# Patient Record
Sex: Female | Born: 1935 | Race: White | Hispanic: No | Marital: Single | Smoking: Never smoker
Health system: Southern US, Community
[De-identification: ages and names within clinical notes are randomized; demographics above are authoritative.]

## PROBLEM LIST (undated history)

## (undated) DIAGNOSIS — K226 Gastro-esophageal laceration-hemorrhage syndrome: Secondary | ICD-10-CM

## (undated) DIAGNOSIS — I509 Heart failure, unspecified: Secondary | ICD-10-CM

## (undated) DIAGNOSIS — F32A Depression, unspecified: Secondary | ICD-10-CM

## (undated) DIAGNOSIS — I4891 Unspecified atrial fibrillation: Secondary | ICD-10-CM

## (undated) DIAGNOSIS — I499 Cardiac arrhythmia, unspecified: Secondary | ICD-10-CM

## (undated) DIAGNOSIS — F329 Major depressive disorder, single episode, unspecified: Secondary | ICD-10-CM

## (undated) DIAGNOSIS — I10 Essential (primary) hypertension: Secondary | ICD-10-CM

## (undated) DIAGNOSIS — M1990 Unspecified osteoarthritis, unspecified site: Secondary | ICD-10-CM

## (undated) DIAGNOSIS — K2210 Ulcer of esophagus without bleeding: Secondary | ICD-10-CM

## (undated) HISTORY — PX: CHOLECYSTECTOMY: SHX55

## (undated) HISTORY — PX: ABDOMINAL HYSTERECTOMY: SHX81

## (undated) HISTORY — PX: APPENDECTOMY: SHX54

---

## 1998-11-15 ENCOUNTER — Ambulatory Visit: Admission: RE | Admit: 1998-11-15 | Discharge: 1998-11-15 | Payer: Self-pay | Admitting: Family Medicine

## 1999-03-02 ENCOUNTER — Ambulatory Visit (HOSPITAL_COMMUNITY): Admission: RE | Admit: 1999-03-02 | Discharge: 1999-03-02 | Payer: Self-pay | Admitting: Family Medicine

## 2001-01-29 ENCOUNTER — Encounter: Payer: Self-pay | Admitting: Family Medicine

## 2001-01-29 ENCOUNTER — Encounter: Admission: RE | Admit: 2001-01-29 | Discharge: 2001-01-29 | Payer: Self-pay | Admitting: Family Medicine

## 2002-05-26 ENCOUNTER — Encounter: Payer: Self-pay | Admitting: Family Medicine

## 2002-05-26 ENCOUNTER — Ambulatory Visit (HOSPITAL_COMMUNITY): Admission: RE | Admit: 2002-05-26 | Discharge: 2002-05-26 | Payer: Self-pay | Admitting: Family Medicine

## 2002-11-17 ENCOUNTER — Encounter: Admission: RE | Admit: 2002-11-17 | Discharge: 2002-11-17 | Payer: Self-pay | Admitting: Family Medicine

## 2002-11-17 ENCOUNTER — Encounter: Payer: Self-pay | Admitting: Family Medicine

## 2002-12-07 ENCOUNTER — Encounter: Admission: RE | Admit: 2002-12-07 | Discharge: 2002-12-07 | Payer: Self-pay | Admitting: Family Medicine

## 2002-12-07 ENCOUNTER — Encounter: Payer: Self-pay | Admitting: Family Medicine

## 2003-07-12 ENCOUNTER — Encounter: Admission: RE | Admit: 2003-07-12 | Discharge: 2003-07-12 | Payer: Self-pay | Admitting: Family Medicine

## 2003-07-12 ENCOUNTER — Encounter: Payer: Self-pay | Admitting: Family Medicine

## 2004-07-16 ENCOUNTER — Inpatient Hospital Stay (HOSPITAL_COMMUNITY): Admission: EM | Admit: 2004-07-16 | Discharge: 2004-07-25 | Payer: Self-pay | Admitting: Emergency Medicine

## 2004-07-23 ENCOUNTER — Encounter (INDEPENDENT_AMBULATORY_CARE_PROVIDER_SITE_OTHER): Payer: Self-pay | Admitting: Cardiology

## 2004-08-09 ENCOUNTER — Encounter: Admission: RE | Admit: 2004-08-09 | Discharge: 2004-08-09 | Payer: Self-pay | Admitting: Family Medicine

## 2007-02-24 ENCOUNTER — Ambulatory Visit (HOSPITAL_COMMUNITY): Admission: RE | Admit: 2007-02-24 | Discharge: 2007-02-24 | Payer: Self-pay | Admitting: Family Medicine

## 2009-02-09 ENCOUNTER — Encounter: Admission: RE | Admit: 2009-02-09 | Discharge: 2009-02-09 | Payer: Self-pay | Admitting: Endocrinology

## 2011-05-10 NOTE — H&P (Signed)
NAME:  Morgan Morris, Morgan Morris                          ACCOUNT NO.:  [id number]   MEDICAL RECORD NO.:  [id number]                   PATIENT TYPE:  EMS   LOCATION:  ED                                   FACILITY:  [facility] [place]   PHYSICIAN:  Jonna L. Robb Matar, M.D.            DATE OF BIRTH:  05-24-1920   DATE OF ADMISSION:  07/16/2004  DATE OF DISCHARGE:                                HISTORY & PHYSICAL   CHIEF COMPLAINT:  Weariness.   HISTORY OF PRESENT ILLNESS:  This 77 year old white female with a 3-day  history of weakness, mild nausea, complete anorexia,  She felt very, very  weary.  She had chills yesterday.  Today her temperature was 102/6 and she  was brought into the emergency room.  She denies any cough that has changed.  She has a low-grade chronic cough every morning but it did change at all,  some generalized aching.   PAST MEDICAL HISTORY:  1. Glaucoma.  2. Some type of an arrythmia that she had years ago. Every once in a while     she will get a paroxysmal tachycardia, may be atrial fibrillation, I'm     not sure.  She has been on Digoxin for years.  3. Type 2 diabetes.  She has had this for 21 years.  It has not seem to have     given her stroke, MI, and she not aware that she had any kidney problem.  4. Hypertension.  5. Psoriasis and psoriatic arthritis.  6. 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   SURGICAL HISTORY:  Appendectomy, hysterectomy, cholecystectomy, bilateral  cataract.   ALLERGIES:  PENICILLIN, SULFA which both make her itch a little bit.   CURRENT MEDICATIONS:  Diovan 80 daily, Lipitor 10 daily, Lantus 28-30 at  h.s., and regular 6-8 units a.c., vitamins, Digitek 0.125 daily, aspirin  325, Lexapro 10, Xalatan eye drops at bedtime, and something called Falcon  b.i.d., they are not sure of the name.   SOCIAL HISTORY:  Nonsmoker, nondrinker, married for 60 years, 2 sons, one  with hypertension.   FAMILY HISTORY:  Cancer of the uterus , impressed.   REVIEW OF  SYSTEMS:  Besides the above, she has not had any soreness or sore  throat but she seems a little hoarse, no wheezing or asthma, no history of  heart attack or blood clots, some mild nausea, no hematemesis or melena.  She does OTERD by history and says that Prilosec helps.  No history of  hematuria or renal calculi, no breast masses or tenderness.  Her last  mammogram in February was normal.  She has never been told of thyroid  problems or anemia.  No swollen glands.  She has psoriasis and psoriatic  arthritis especially in her hands.  Denies seizures or headache.  X has some  mild depression and takes Lexapro.   PHYSICAL EXAMINATION:  99.3, 132/63, 70, 20 97%.  Well-nourished, well-  developed older white female with slightly hoarse voice, conjunctiva in lids  and normal pupils reactive to light and accommodation.  EOMs are full.  She  has decreased hearing in the left ear, dry mucosa, normal pharynx.  NECK:  Supple, without masses or tenderness or thyromegaly.  RESPIRATORY EFFORT:  Normal, the lungs have bibasilar crackles without  wheezing or dullness.  HEART:  Regular rate and rhythm, normal S1 and S2 without murmurs, rubs or  gallops.  Pulse is without bruits, no cyanosis, clubbing or edema.  BREASTS:  NO masses, tenderness or discharge.  ABDOMEN:  Nontender, normal bowel sounds, no hepatosplenomegaly or hernia.  She has a cholecystectomy scar and an appendectomy scar.  GENITALIA:  Normal external genitalia, no cervical or inguinal adenopathy.  EXTREMITIES:  Muscle strength is 5/5 all 4 extremities.  She does have  arthritic changes in the hands.  There are some patches of psoriasis.  NEURO:  Cranial nerves are intact.  DTRs are 2+ and equal.  Full toes go  down.  She is alert and oriented x3.  Normal memory, judgment and affect.  History is from the patient and her husband   ADMISSION LABORATORIES:  EKG shows low voltage, old injury septal MI.  Chest x-ray shows bilateral basilar  pneumonia.  White count is 9.4 with 80% neutrophils.  Hemoglobin is 10.5 with normal  indices.  Urinalysis shows a small positive leukocyte esterase.  BNP is  notable for BUN 37, creatinine 2.1, glucose 213, and an albumin of 3.0.   IMPRESSION:  1. Pneumonia.  She will receive Rocephin and Zithromax.  Blood cultures are     pending.  2. Dehydration.  I'll put her on some IV fluids as possible.  This may     represent a chronic renal failure from diabetes, but it is probably more     likely acute renal failure and dehydration.  3. Uncontrolled diabetes.  I'll put her on sliding scale.  4. Glaucoma.  She will continue her present drops.  5. Hypertension.  I'm going to hold her ARB.  6. Psoriasis and psoriatic arthritis.  7. Anemia.  I'll work that up.   Her primary care physician is Dr. Penni Bombard.                                               Jonna L. Robb Matar, M.D.    Dorna Bloom  D:  07/16/2004  T:  07/16/2004  Job:  [id number]   cc:   Rodolph Bong, M.D.  [address] [place], [place] [address]  Fax: [phone]

## 2011-05-10 NOTE — Discharge Summary (Signed)
NAME:  Morgan Morris, Morgan Morris                          ACCOUNT NO.:  [id number]   MEDICAL RECORD NO.:  [id number]                   PATIENT TYPE:  INP   LOCATION:  0366                                 FACILITY:  [facility]   PHYSICIAN:  Lonia Blood, M.D.                   DATE OF BIRTH:  02-02-1920   DATE OF ADMISSION:  07/16/2004  DATE OF DISCHARGE:  07/25/2004                                 DISCHARGE SUMMARY   PRIMARY CARE PHYSICIAN:  Dr. Penni Bombard   DISCHARGE DIAGNOSES:  1. Bilateral lower lobe pneumonia, right worse than left.  2. Left peripneumonic effusion.  3. Diabetes type 2.  4. Hypertension.  5. Flash pulmonary edema.  6. Chronic anemia.  7. Leukocytosis.  8. Glaucoma.  9. Mild renal insufficiency.   DISCHARGE MEDICATIONS:  1. Digoxin 0.125 mg daily.  2. Lipitor 10 mg p.o. daily.  3. Xalatan ophthalmology eye drops q.h.s.  4. Lantus 30 units at night daily.  5. Sliding scale insulin at mealtimes.  6. Diovan 160 mg daily.  7. Avelox 400 mg daily for seven days.  8. Senokot S one tablet daily for constipation.   DISPOSITION AND FOLLOW-UP:  The patient is to follow up with Dr. Penni Bombard  early next week.  During that follow-up we recommend recheck of her fluid  status and consider starting Lasix.  At time of discharge patient is  slightly on the dry side secondary to Lasix that she received and she might  ultimately need some diuretics but her diuretics have been stopped.  She  will also need to have a BMET check drawn that visit to check her BUN and  creatinine and CO2.  I also suggest another chest x-ray in one month to  follow up on the peripneumonic effusion which seems to be going down from  the series of x-rays we had here.   PROCEDURES PERFORMED:  Plain chest x-ray on admission showed bilateral lower  lobe pneumonia, infiltrates consistent with pneumonia.  She had a follow-up  on July 18, 2004 that showed evidence of congestive heart failure with  increased infiltrates.   A follow-up again on July 22, 2004 was negative for  interval change.  Last chest x-ray on July 24, 2004 shows marked  improvement.  She had a 2-D echocardiogram performed on July 23, 2004 that  shows normal left ventricular size.  She had an ejection fraction of 55-65%  with normal systolic function.  She had aortic valve thickness that was  mildly to moderately increased with mild mitral regurgitation, moderate  tricuspid regurgitation, anterior pericardial effusion posterior to the  heart.  Also, patient was found to have large left pleural effusion.   HISTORY AND PHYSICAL:  Morgan Morris was admitted on July 16, 2004 with  nonspecific complaint of weariness.  She also had some mild nausea and  anorexia.  The patient also complained of fever and chills  prior to arriving  in the hospital with some low grade chronic cough and generalized aching.  She was found to have a temperature of 99.3, blood pressure 132/63, but  normal saturations.  She had biphasic basilar crackles without wheezing or  dullness on admission.  A chest x-ray on admission showed bilateral  pneumonia and blood cultures showed contamination with coag-negative Staph,  one out of two blood cultures.   HOSPITAL COURSE:  #1 - PNEUMONIA:  The patient was initially started on  Zithromax and Rocephin IV on admission on July 16, 2004.  Was briefly on  vancomycin, subsequently following the blood culture was coag-negative Staph  but that was discontinued.  Following the finding that it was coag-negative  Staph rather than Staph aureus, patient was placed back on IV Zithromax and  Rocephin and subsequently discharged on Avelox p.o. to complete a 14-day  course of antibiotics.  Her pneumonia has improved, although she has had  some peripneumonic effusion which seemed to be improving on discharge.  She  will have a follow-up chest x-ray in one month to further evaluate that.   #2 - DIABETES:  The patient has had diabetes for 22  years and she is well  educated and knows the disease very well.  She has had a number of  hypoglycemic episodes during this admission mainly late at night.  Per  patient she usually takes a large amount of snack before bedtime but the  snacks she gets in the hospital have been inadequate.  We made slight  adjustment to her home insulin dose.  However, she would rather get back on  her home dose now that she is going home and back on her regular diet.  I  suggest Dr. Penni Bombard will make adjustment when he sees her next week.   #3 - CONGESTIVE HEART FAILURE:  The patient had some flash pulmonary edema  during this admission that was probably related to fluid overload.  She had  a BNP that was checked that was 344 on July 16, 2004, following diuresis was  66.  She had a 2-D echocardiogram, as mentioned, that shows normal LV  function.  She received Lasix 40 mg b.i.d. initially, reduced to 40 daily  and she was discharged without any Lasix secondary to her elements of some  contraction alkalosis and evidence of patient being dry.  She may require  outpatient diuretics if deemed fit by her PCP.   #4 - ANEMIA:  The patient had stable anemia with hemoglobin around 10 which  is normocytic and normochromic probably secondary to chronic disease.  She  did have a very high ferritin and decreased iron saturations that may signal  possibly a mixed picture or a mixed cause of her anemia.  I suggest she  continue some iron as an outpatient.   #5 - LEUKOCYTOSIS:  The patient's white count jumped to 13,000 on July 22, 2004 and July 23, 2004, but has since steadily come down and at the time of  discharge was 11.3.                                               Lonia Blood, M.D.    Verlin Grills  D:  07/25/2004  T:  07/26/2004  Job:  [id number]   cc:   Penni Bombard, M.D.

## 2012-03-29 ENCOUNTER — Encounter (HOSPITAL_COMMUNITY): Payer: Self-pay | Admitting: *Deleted

## 2012-03-29 ENCOUNTER — Emergency Department (HOSPITAL_COMMUNITY): Payer: Medicare Other

## 2012-03-29 ENCOUNTER — Other Ambulatory Visit: Payer: Self-pay

## 2012-03-29 ENCOUNTER — Inpatient Hospital Stay (HOSPITAL_COMMUNITY)
Admission: EM | Admit: 2012-03-29 | Discharge: 2012-04-02 | DRG: 640 | Disposition: A | Discharge status: Home / Self Care | Payer: Medicare Other | Attending: Internal Medicine | Admitting: Internal Medicine

## 2012-03-29 DIAGNOSIS — Z794 Long term (current) use of insulin: Secondary | ICD-10-CM

## 2012-03-29 DIAGNOSIS — E119 Type 2 diabetes mellitus without complications: Secondary | ICD-10-CM | POA: Diagnosis present

## 2012-03-29 DIAGNOSIS — E876 Hypokalemia: Secondary | ICD-10-CM | POA: Diagnosis not present

## 2012-03-29 DIAGNOSIS — K208 Other esophagitis without bleeding: Secondary | ICD-10-CM | POA: Diagnosis present

## 2012-03-29 DIAGNOSIS — K226 Gastro-esophageal laceration-hemorrhage syndrome: Secondary | ICD-10-CM | POA: Diagnosis present

## 2012-03-29 DIAGNOSIS — A088 Other specified intestinal infections: Secondary | ICD-10-CM | POA: Diagnosis present

## 2012-03-29 DIAGNOSIS — Z880 Allergy status to penicillin: Secondary | ICD-10-CM

## 2012-03-29 DIAGNOSIS — R110 Nausea: Secondary | ICD-10-CM | POA: Diagnosis present

## 2012-03-29 DIAGNOSIS — E860 Dehydration: Principal | ICD-10-CM | POA: Diagnosis present

## 2012-03-29 DIAGNOSIS — Z79899 Other long term (current) drug therapy: Secondary | ICD-10-CM

## 2012-03-29 DIAGNOSIS — F3289 Other specified depressive episodes: Secondary | ICD-10-CM | POA: Diagnosis present

## 2012-03-29 DIAGNOSIS — K920 Hematemesis: Secondary | ICD-10-CM | POA: Diagnosis present

## 2012-03-29 DIAGNOSIS — R197 Diarrhea, unspecified: Secondary | ICD-10-CM | POA: Diagnosis present

## 2012-03-29 DIAGNOSIS — K838 Other specified diseases of biliary tract: Secondary | ICD-10-CM | POA: Diagnosis present

## 2012-03-29 DIAGNOSIS — Z66 Do not resuscitate: Secondary | ICD-10-CM | POA: Diagnosis present

## 2012-03-29 DIAGNOSIS — R748 Abnormal levels of other serum enzymes: Secondary | ICD-10-CM | POA: Diagnosis present

## 2012-03-29 DIAGNOSIS — K2210 Ulcer of esophagus without bleeding: Secondary | ICD-10-CM | POA: Diagnosis present

## 2012-03-29 DIAGNOSIS — N289 Disorder of kidney and ureter, unspecified: Secondary | ICD-10-CM | POA: Diagnosis present

## 2012-03-29 DIAGNOSIS — D649 Anemia, unspecified: Secondary | ICD-10-CM | POA: Diagnosis present

## 2012-03-29 DIAGNOSIS — I10 Essential (primary) hypertension: Secondary | ICD-10-CM | POA: Diagnosis present

## 2012-03-29 DIAGNOSIS — R112 Nausea with vomiting, unspecified: Secondary | ICD-10-CM

## 2012-03-29 DIAGNOSIS — F329 Major depressive disorder, single episode, unspecified: Secondary | ICD-10-CM | POA: Diagnosis present

## 2012-03-29 DIAGNOSIS — Z7982 Long term (current) use of aspirin: Secondary | ICD-10-CM

## 2012-03-29 DIAGNOSIS — D72829 Elevated white blood cell count, unspecified: Secondary | ICD-10-CM | POA: Diagnosis present

## 2012-03-29 HISTORY — DX: Gastro-esophageal laceration-hemorrhage syndrome: K22.6

## 2012-03-29 HISTORY — DX: Essential (primary) hypertension: I10

## 2012-03-29 HISTORY — DX: Major depressive disorder, single episode, unspecified: F32.9

## 2012-03-29 HISTORY — DX: Depression, unspecified: F32.A

## 2012-03-29 HISTORY — DX: Ulcer of esophagus without bleeding: K22.10

## 2012-03-29 LAB — URINALYSIS, ROUTINE W REFLEX MICROSCOPIC
Bilirubin Urine: NEGATIVE
Glucose, UA: NEGATIVE mg/dL
Hgb urine dipstick: NEGATIVE
Protein, ur: NEGATIVE mg/dL

## 2012-03-29 LAB — CBC
HCT: 32.3 % — ABNORMAL LOW (ref 36.0–46.0)
Hemoglobin: 11.2 g/dL — ABNORMAL LOW (ref 12.0–15.0)
MCHC: 34.7 g/dL (ref 30.0–36.0)
RBC: 3.46 MIL/uL — ABNORMAL LOW (ref 3.87–5.11)

## 2012-03-29 LAB — CLOSTRIDIUM DIFFICILE BY PCR: Toxigenic C. Difficile by PCR: NEGATIVE

## 2012-03-29 LAB — COMPREHENSIVE METABOLIC PANEL
BUN: 42 mg/dL — ABNORMAL HIGH (ref 6–23)
CO2: 26 mEq/L (ref 19–32)
Calcium: 8.7 mg/dL (ref 8.4–10.5)
Chloride: 103 mEq/L (ref 96–112)
GFR calc Af Amer: 41 mL/min — ABNORMAL LOW (ref >90)
GFR calc non Af Amer: 35 mL/min — ABNORMAL LOW (ref >90)
Potassium: 4 mEq/L (ref 3.5–5.1)
Sodium: 138 mEq/L (ref 135–145)
Total Bilirubin: 0.6 mg/dL (ref 0.3–1.2)

## 2012-03-29 LAB — TROPONIN I: Troponin I: <0.3 ng/mL (ref <0.30)

## 2012-03-29 LAB — CREATININE, SERUM
GFR calc Af Amer: 40 mL/min — ABNORMAL LOW (ref >90)
GFR calc non Af Amer: 35 mL/min — ABNORMAL LOW (ref >90)

## 2012-03-29 LAB — LIPASE, BLOOD: Lipase: 127 U/L — ABNORMAL HIGH (ref 11–59)

## 2012-03-29 MED ORDER — ESCITALOPRAM OXALATE 10 MG PO TABS
10.0000 mg | ORAL_TABLET | Freq: Every morning | ORAL | Status: DC
  Administered 2012-03-30 – 2012-04-02 (×4): 10 mg via ORAL
  Filled 2012-03-29 (×4): qty 1

## 2012-03-29 MED ORDER — SODIUM CHLORIDE 0.9 % IV SOLN
8.0000 mg/h | INTRAVENOUS | Status: DC
  Administered 2012-03-30: 8 mg/h via INTRAVENOUS
  Filled 2012-03-29 (×4): qty 80

## 2012-03-29 MED ORDER — INSULIN GLARGINE 100 UNIT/ML ~~LOC~~ SOLN
40.0000 [IU] | Freq: Every day | SUBCUTANEOUS | Status: DC
  Administered 2012-03-29: 40 [IU] via SUBCUTANEOUS

## 2012-03-29 MED ORDER — ONDANSETRON HCL 4 MG PO TABS
4.0000 mg | ORAL_TABLET | Freq: Four times a day (QID) | ORAL | Status: DC | PRN
  Administered 2012-04-01: 4 mg via ORAL
  Filled 2012-03-29: qty 1

## 2012-03-29 MED ORDER — SIMVASTATIN 20 MG PO TABS
20.0000 mg | ORAL_TABLET | Freq: Every evening | ORAL | Status: DC
  Administered 2012-03-30 – 2012-04-01 (×3): 20 mg via ORAL
  Filled 2012-03-29 (×5): qty 1

## 2012-03-29 MED ORDER — ONDANSETRON HCL 4 MG/2ML IJ SOLN
4.0000 mg | Freq: Once | INTRAMUSCULAR | Status: AC
  Administered 2012-03-29: 4 mg via INTRAVENOUS
  Filled 2012-03-29: qty 2

## 2012-03-29 MED ORDER — IOHEXOL 300 MG/ML  SOLN
100.0000 mL | Freq: Once | INTRAMUSCULAR | Status: AC | PRN
  Administered 2012-03-29: 100 mL via INTRAVENOUS

## 2012-03-29 MED ORDER — ALUM & MAG HYDROXIDE-SIMETH 200-200-20 MG/5ML PO SUSP
30.0000 mL | Freq: Four times a day (QID) | ORAL | Status: DC | PRN
Start: 2012-03-29 — End: 2012-04-02

## 2012-03-29 MED ORDER — SACCHAROMYCES BOULARDII 250 MG PO CAPS
250.0000 mg | ORAL_CAPSULE | Freq: Two times a day (BID) | ORAL | Status: DC
  Administered 2012-03-30 – 2012-04-02 (×7): 250 mg via ORAL
  Filled 2012-03-29 (×9): qty 1

## 2012-03-29 MED ORDER — ASPIRIN EC 325 MG PO TBEC
325.0000 mg | DELAYED_RELEASE_TABLET | Freq: Every day | ORAL | Status: DC
  Filled 2012-03-29: qty 1

## 2012-03-29 MED ORDER — DIGOXIN 125 MCG PO TABS
125.0000 ug | ORAL_TABLET | Freq: Every day | ORAL | Status: DC
  Administered 2012-03-30 – 2012-04-02 (×3): 125 ug via ORAL
  Filled 2012-03-29 (×5): qty 1

## 2012-03-29 MED ORDER — ONDANSETRON HCL 4 MG/2ML IJ SOLN
INTRAMUSCULAR | Status: AC
  Administered 2012-03-29: 14:00:00
  Filled 2012-03-29: qty 2

## 2012-03-29 MED ORDER — ENOXAPARIN SODIUM 40 MG/0.4ML ~~LOC~~ SOLN
40.0000 mg | SUBCUTANEOUS | Status: DC
  Filled 2012-03-29: qty 0.4

## 2012-03-29 MED ORDER — HYDROCODONE-ACETAMINOPHEN 5-325 MG PO TABS: 1.0000 | ORAL_TABLET | ORAL | Status: DC | PRN

## 2012-03-29 MED ORDER — ALBUTEROL SULFATE (5 MG/ML) 0.5% IN NEBU: 2.5000 mg | INHALATION_SOLUTION | RESPIRATORY_TRACT | Status: DC | PRN

## 2012-03-29 MED ORDER — SODIUM CHLORIDE 0.9 % IV BOLUS (SEPSIS)
1000.0000 mL | Freq: Once | INTRAVENOUS | Status: AC
  Administered 2012-03-29: 500 mL via INTRAVENOUS

## 2012-03-29 MED ORDER — ONDANSETRON HCL 4 MG/2ML IJ SOLN: 4.0000 mg | Freq: Four times a day (QID) | INTRAMUSCULAR | Status: DC | PRN

## 2012-03-29 MED ORDER — GUAIFENESIN-DM 100-10 MG/5ML PO SYRP: 5.0000 mL | ORAL_SOLUTION | ORAL | Status: DC | PRN

## 2012-03-29 MED ORDER — ACETAMINOPHEN 650 MG RE SUPP: 650.0000 mg | Freq: Four times a day (QID) | RECTAL | Status: DC | PRN

## 2012-03-29 MED ORDER — TIMOLOL HEMIHYDRATE 0.5 % OP SOLN
1.0000 [drp] | Freq: Two times a day (BID) | OPHTHALMIC | Status: DC
  Administered 2012-03-30 – 2012-04-02 (×6): 1 [drp] via OPHTHALMIC
  Filled 2012-03-29 (×17): qty 0.1

## 2012-03-29 MED ORDER — ACETAMINOPHEN 325 MG PO TABS: 650.0000 mg | ORAL_TABLET | Freq: Four times a day (QID) | ORAL | Status: DC | PRN

## 2012-03-29 MED ORDER — SODIUM CHLORIDE 0.9 % IV SOLN
INTRAVENOUS | Status: DC
  Administered 2012-03-29: 23:00:00 via INTRAVENOUS

## 2012-03-29 NOTE — ED Provider Notes (Signed)
7 PM patient feels general weakness and nausea. Reportedly also had multiple episodes of diarrhea today. She feels dehydrated, Zofran ordered. Hospitalist physician called to evaluate patient for admission in light of diabetes and dehydration.  Doug Sou, MD 03/29/12 Windell Moment

## 2012-03-29 NOTE — ED Provider Notes (Addendum)
History     CSN: [id number]  Arrival date & time 03/29/12  1337   First MD Initiated Contact with Patient 03/29/12 1403      Chief Complaint  Patient presents with  . Weakness  . Nausea    The history is provided by the patient.   the patient reports generalized weakness nausea and diarrhea.  She's had generalized decreased energy today.  She reports epigastric discomfort.  She denies vomiting.  She reports she's never had upper abdominal pain like this before.  She denies chest pain or shortness of breath.  She's had no fevers or chills.  She has a history of diabetes and hypertension.  She's had an abdominal hysterectomy but has never had her gallbladder removed.  Nothing worsens her symptoms.  Nothing improves her symptoms.  Her symptoms are constant.  Past Medical History  Diagnosis Date  . Hypertension   . Diabetes mellitus   . Depression     Past Surgical History  Procedure Date  . Abdominal hysterectomy     No family history on file.  History  Substance Use Topics  . Smoking status: Never Smoker   . Smokeless tobacco: Not on file  . Alcohol Use: No    OB History    Grav Para Term Preterm Abortions TAB SAB Ect Mult Living                  Review of Systems  All other systems reviewed and are negative.    Allergies  Penicillins and Sulfa antibiotics  Home Medications   Current Outpatient Rx  Name Route Sig Dispense Refill  . ASPIRIN EC 325 MG PO TBEC Oral Take 325 mg by mouth daily at 12 noon.    . B COMPLEX PO TABS Oral Take 1 tablet by mouth daily at 12 noon.    Marland Kitchen VITAMIN D3 PO Oral Take 1 tablet by mouth daily at 12 noon.    Marland Kitchen DIGOXIN 0.125 MG PO TABS Oral Take 125 mcg by mouth daily at 12 noon.    Marland Kitchen ESCITALOPRAM OXALATE 10 MG PO TABS Oral Take 10 mg by mouth every morning.    . INSULIN GLARGINE 100 UNIT/ML [place] SOLN Subcutaneous Inject 40 Units into the skin at bedtime.    . INSULIN REGULAR HUMAN 100 UNIT/ML IJ SOLN Subcutaneous Inject into the skin  3 (three) times daily before meals. She uses a sliding scale.    Marland Kitchen LOSARTAN POTASSIUM 50 MG PO TABS Oral Take 50 mg by mouth every morning.    Marland Kitchen PRESERVISION AREDS PO Oral Take 1 tablet by mouth daily at 12 noon.    Marland Kitchen SIMVASTATIN 20 MG PO TABS Oral Take 20 mg by mouth every evening.    Marland Kitchen TIMOLOL HEMIHYDRATE 0.5 % OP SOLN Both Eyes Place 1 drop into both eyes 2 (two) times daily.    Marland Kitchen ONDANSETRON HCL 4 MG/2ML IJ SOLN Intravenous Inject 4 mg into the vein once.      BP 127/69  Pulse 69  Temp 98.7 F (37.1 C)  Resp 20  SpO2 97%  Physical Exam  Nursing note and vitals reviewed. Constitutional: She is oriented to person, place, and time. She appears well-developed and well-nourished. No distress.  HENT:  Head: Normocephalic and atraumatic.  Eyes: EOM are normal.  Neck: Normal range of motion.  Cardiovascular: Normal rate, regular rhythm and normal heart sounds.   Pulmonary/Chest: Effort normal and breath sounds normal.  Abdominal: Soft. She exhibits no distension. There  is no tenderness.       Mild epigastric tenderness  Musculoskeletal: Normal range of motion.  Neurological: She is alert and oriented to person, place, and time.  Skin: Skin is warm and dry.  Psychiatric: She has a normal mood and affect. Judgment normal.    ED Course  Procedures (including critical care time)   Date: 03/29/2012  Rate: 70  Rhythm: normal sinus rhythm  QRS Axis: normal  Intervals: normal  ST/T Wave abnormalities: normal  Conduction Disutrbances: none  Narrative Interpretation:   Old EKG Reviewed: No significant changes noted     Labs Reviewed  CBC - Abnormal; Notable for the following:    WBC 13.6 (*)    RBC 3.46 (*)    Hemoglobin 11.2 (*)    HCT 32.3 (*)    All other components within normal limits  COMPREHENSIVE METABOLIC PANEL - Abnormal; Notable for the following:    Glucose, Bld 132 (*)    BUN 42 (*)    Creatinine, Ser 1.31 (*)    GFR calc non Af Amer 35 (*)    GFR calc Af  Amer 41 (*)    All other components within normal limits  LIPASE, BLOOD - Abnormal; Notable for the following:    Lipase 127 (*)    All other components within normal limits  URINALYSIS, ROUTINE W REFLEX MICROSCOPIC  TROPONIN I  CLOSTRIDIUM DIFFICILE BY PCR   No results found.   No diagnosis found.    MDM  The patient reports mild epigastric tenderness with nausea and diarrhea.  She has mild tenderness in epigastric region lipase 127.  She still has her gallbladder.  CT scan pending at this time given the fact that she also has diarrhea.  Regardless she'll likely need admission for her nausea her elevated lipase no diarrhea.         Lyanne Co, MD 03/29/12 1707  Lyanne Co, MD 03/29/12 [phone]

## 2012-03-29 NOTE — ED Notes (Signed)
Per EMS - pt comes from home where she lives in [facility].  Pt c/o weakness, nausea and general lethargy.  Pt is A&O.  Pt's vitals WNL.  Pt denies vomiting and diarrhea.  Pt also c/o abdominal pain.

## 2012-03-29 NOTE — H&P (Signed)
PCP:  Dr. Ricki Miller  Chief Complaint:  diarrhea HPI: Morgan Morris is a 78 y.o. female   has a past medical history of Hypertension; Diabetes mellitus; and Depression.   Presented with  2 weeks of diarrhea that has been getting worse. She is now having a few BM per hour almost continuas diarrhea. No fever. Started to have abdominal pain yesterday. No vomitting but some nausea She states shehas good appetite.  No chest pain or SOB. No recent exposure to antibiotics. She is unsure if anyone arround her have similar symtoms as [facility].   Review of Systems:     Pertinent positives include:, abdominal pain, nausea, diarrhea, fatigue,   Constitutional:  No weight loss, night sweats, Fevers, chills, weight loss  HEENT:  No headaches, Difficulty swallowing,Tooth/dental problems,Sore throat,  No sneezing, itching, ear ache, nasal congestion, post nasal drip,  Cardio-vascular:  No chest pain, Orthopnea, PND, anasarca, dizziness, palpitations.no Bilateral lower extremity swelling  GI:  No heartburn, indigestionvomiting, change in bowel habits, loss of appetite, melena, blood in stool, hematoemesis Resp:  no shortness of breath at rest. No dyspnea on exertion, No excess mucus, no productive cough, No non-productive cough, No coughing up of blood.No change in color of mucus.No wheezing. Skin:  no rash or lesions. No jaundice GU:  no dysuria, change in color of urine, no urgency or frequency. No straining to urinate.  No flank pain.  Musculoskeletal:  No joint pain or no joint swelling. No decreased range of motion. No back pain.  Psych:  No change in mood or affect. No depression or anxiety. No memory loss.  Neuro: no localizing neurological complaints, no tingling, no weakness, no double vision, no gait abnormality, no slurred speech, no confusion  Otherwise ROS are negative except for above, 10 systems were reviewed  Past Medical History: Past Medical History    Diagnosis Date  . Hypertension   . Diabetes mellitus   . Depression    Past Surgical History  Procedure Date  . Abdominal hysterectomy      Medications: Prior to Admission medications   Medication Sig Start Date End Date Taking? Authorizing Provider  aspirin EC 325 MG tablet Take 325 mg by mouth daily at 12 noon.   Yes Historical Provider, MD  b complex vitamins tablet Take 1 tablet by mouth daily at 12 noon.   Yes Historical Provider, MD  Cholecalciferol (VITAMIN D3 PO) Take 1 tablet by mouth daily at 12 noon.   Yes Historical Provider, MD  digoxin (LANOXIN) 0.125 MG tablet Take 125 mcg by mouth daily at 12 noon.   Yes Historical Provider, MD  escitalopram (LEXAPRO) 10 MG tablet Take 10 mg by mouth every morning.   Yes Historical Provider, MD  insulin glargine (LANTUS) 100 UNIT/ML injection Inject 40 Units into the skin at bedtime.   Yes Historical Provider, MD  insulin regular (NOVOLIN R,HUMULIN R) 100 units/mL injection Inject into the skin 3 (three) times daily before meals. She uses a sliding scale.   Yes Historical Provider, MD  losartan (COZAAR) 50 MG tablet Take 50 mg by mouth every morning.   Yes Historical Provider, MD  Multiple Vitamins-Minerals (PRESERVISION AREDS PO) Take 1 tablet by mouth daily at 12 noon.   Yes Historical Provider, MD  simvastatin (ZOCOR) 20 MG tablet Take 20 mg by mouth every evening.   Yes Historical Provider, MD  timolol (BETIMOL) 0.5 % ophthalmic solution Place 1 drop into both eyes 2 (two) times daily.  Yes Historical Provider, MD  ondansetron (ZOFRAN) 4 MG/2ML SOLN injection Inject 4 mg into the vein once.    Historical Provider, MD    Allergies:   Allergies  Allergen Reactions  . Penicillins   . Sulfa Antibiotics     Social History:  Ambulatory independently  Lives at [facility]   reports that she has never smoked. She does not have any smokeless tobacco history on file. She reports that she does not drink alcohol.   Family  History: family history includes Cancer in her mother.    Physical Exam: Patient Vitals for the past 24 hrs:  BP Temp Temp src Pulse Resp SpO2  03/29/12 1855 137/51 mmHg 98.9 F (37.2 C) Oral 71  20  98 %  03/29/12 1355 127/69 mmHg 98.7 F (37.1 C) - 69  20  97 %    1. General:  in No Acute distress 2. Psychological: Alert and Oriented but has difficulty of hearing 3. Head/ENT:   Moist Mucous Membranes                          Head Non traumatic, neck supple                          Normal  Dentition 4. SKIN:  decreased Skin turgor,  Skin clean Dry and intact no rash 5. Heart: Regular rate and rhythm no Murmur, Rub or gallop 6. Lungs: Clear to auscultation bilaterally, no wheezes or crackles   7. Abdomen: Soft, non-tender, Non distended normal bowel sounds 8. Lower extremities: no clubbing, cyanosis, or edema 9. Neurologically Grossly intact, moving all 4 extremities equally 10. MSK: Normal range of motion  body mass index is unknown because there is no height or weight on file.   Labs on Admission:   [facility] 03/29/12 1427  NA 138  K 4.0  CL 103  CO2 26  GLUCOSE 132*  BUN 42*  CREATININE 1.31*  CALCIUM 8.7  MG --  PHOS --    Basename 03/29/12 1427  AST 15  ALT 11  ALKPHOS 53  BILITOT 0.6  PROT 6.9  ALBUMIN 3.7    Basename 03/29/12 1427  LIPASE 127*  AMYLASE --    Basename 03/29/12 1427  WBC 13.6*  NEUTROABS --  HGB 11.2*  HCT 32.3*  MCV 93.4  PLT 166    Basename 03/29/12 1427  CKTOTAL --  CKMB --  CKMBINDEX --  TROPONINI <0.30   No results found for this basename: TSH,T4TOTAL,FREET3,T3FREE,THYROIDAB in the last 72 hours No results found for this basename: VITAMINB12:2,FOLATE:2,FERRITIN:2,TIBC:2,IRON:2,RETICCTPCT:2 in the last 72 hours No results found for this basename: HGBA1C    CrCl is unknown because there is no height on file for the current visit. ABG No results found for this basename: phart, pco2, po2, hco3, tco2, acidbasedef,  o2sat     No results found for this basename: DDIMER     Other results:  I have pearsonaly reviewed this: ECG REPORT  Rate: 70  Rhythm: Normal sinus rhythm ST&T Change: No ischemic changes  UA no evidence of infection   Cultures: No results found for this basename: sdes, specrequest, cult, reptstatus       Radiological Exams on Admission: Ct Abdomen Pelvis W Contrast  03/29/2012  *RADIOLOGY REPORT*  Clinical Data: Watery diarrhea for 2 days.  Diabetic. Hypertensive.  Prior hysterectomy.  CT ABDOMEN AND PELVIS WITH CONTRAST  Technique:  Multidetector  CT imaging of the abdomen and pelvis was performed following the standard protocol during bolus administration of intravenous contrast.  Contrast: OMNIPAQUE IOHEXOL 300 MG/ML  SOLN  Comparison: None.  Findings: Contrast filled top normal sized stomach, small bowel loops and colon without extraluminal bowel inflammatory process, free fluid or free air.  Diverticula most notable sigmoid colon. Appendix not visualized.  Dilated intrahepatic and extrahepatic bile ducts without mass identified. This may be related to patient's age and post cholecystectomy state.  Correlation with laboratory studies recommended.  If there are elevated liver function studies than further investigation may be indicated.  Cardiomegaly.  Atherosclerotic type changes including aortic ectasia and branch vessel ectasia.  Moderate narrowing celiac artery and superior mesenteric artery.  Mild ectasia common iliac arteries.  Calcification right lobe liver without focal liver lesion otherwise noted.  Spleen top normal size without focal mass.  No pancreatic, adrenal or renal mass.  Marked endplate reactive changes L1-2.  This probably represents result of degenerative disease.  Infection is a secondary consideration, not excluded in the proper clinical setting. Scoliosis.  Hip joint degenerative changes.  Subcutaneous inflammation may be related to injection for diabetes.   IMPRESSION: Contrast filled top normal sized stomach, small bowel loops and colon without extraluminal bowel inflammatory process, free fluid or free air.  Diverticula most notable sigmoid colon.  Appendix not visualized.  Dilated intrahepatic and extrahepatic bile ducts without mass identified. This may be related to patient's age and post cholecystectomy state.  Correlation with laboratory studies recommended.  If there are elevated liver function studies than further investigation may be indicated.  Cardiomegaly.  Atherosclerotic type changes as noted above.  Marked endplate reactive changes L1-2.  This probably represents result of degenerative disease.  Infection is a secondary consideration, not excluded in the proper clinical setting.  Original Report Authenticated By: Fuller Canada, M.D.    Assessment/Plan  This is a 78 year old female with past history of diabetes presents with worsening history of diarrhea and today also some abdominal discomfort.  Present on Admission:  .Diarrhea - etiology is uncertain will obtain stool studies. We'll check C. difficile PCR. Send stool cultures. There is no evidence of colitis per CAT scan. No masses.  she has no recent exposure to antibiotics. If diarrhea does not improve consider a GI consult. C. difficile negative by PCR. Would try to add for store and if no evidence of infectious diarrhea would initiate Imodium or other agent  .Nausea - symptomatic relief  .Dehydration  - IV fluid rehydration  .Elevated lipase - CT scan did not show any evidence of pancreatitis she has good appetite no current significant abdominal pain and a benign abdomen. Clinically she does not appear to have pancreatitis at this point. Will continue to followup clinically. There is evidence of and for hepatic biliary duct dilatation but no evidence of obstruction per se her CT scan she status post cholecystectomy. LFTs are within normal limits.  .Leukocytosis - could be secondary  to hemoconcentration.infectious histology is not ruled out at this point UA is unremarkable  .Anemia - this is chronic, Will followup clinically Diabetes mellitus  - continue home Lantus put on sliding scale   Prophylaxis:  Lovenox, Protonix  CODE STATUS: DNR/DNI As per patient's wishes    I have spent a total of  65 min on this admition  Morgan Morris 03/29/2012, 8:16 PM

## 2012-03-29 NOTE — ED Notes (Signed)
Pt returned from CT °

## 2012-03-29 NOTE — ED Notes (Signed)
CBG was 105 with EMS.

## 2012-03-29 NOTE — ED Notes (Signed)
[id number]<UV> Expected date:<BR> Expected time: 1:23 PM<BR> Means of arrival:<BR> Comments:<BR> M41 - 89yoF Gen weakness

## 2012-03-29 NOTE — Progress Notes (Signed)
Patient had a large amount of emesis, may have been projectile due to fact that it is all over bed linens and floor at bedside. Next patient had emesis mixed with Bright Red Blood(unmeasurable). Denies having anything red to eat or drink. Called and spoke with ED nurse and she says they did not give the patient anything red to eat or drink. Text message was sent to Lenny Pastel NP(on call for Hospitalist)

## 2012-03-30 ENCOUNTER — Encounter (HOSPITAL_COMMUNITY): Payer: Self-pay | Admitting: *Deleted

## 2012-03-30 DIAGNOSIS — R112 Nausea with vomiting, unspecified: Secondary | ICD-10-CM

## 2012-03-30 DIAGNOSIS — E860 Dehydration: Principal | ICD-10-CM

## 2012-03-30 DIAGNOSIS — K920 Hematemesis: Secondary | ICD-10-CM | POA: Diagnosis present

## 2012-03-30 DIAGNOSIS — R197 Diarrhea, unspecified: Secondary | ICD-10-CM

## 2012-03-30 DIAGNOSIS — D649 Anemia, unspecified: Secondary | ICD-10-CM

## 2012-03-30 LAB — COMPREHENSIVE METABOLIC PANEL
ALT: 12 U/L (ref 0–35)
Alkaline Phosphatase: 48 U/L (ref 39–117)
BUN: 37 mg/dL — ABNORMAL HIGH (ref 6–23)
CO2: 26 mEq/L (ref 19–32)
Chloride: 107 mEq/L (ref 96–112)
GFR calc Af Amer: 37 mL/min — ABNORMAL LOW (ref >90)
Glucose, Bld: 83 mg/dL (ref 70–99)
Potassium: 3.7 mEq/L (ref 3.5–5.1)
Total Bilirubin: 0.5 mg/dL (ref 0.3–1.2)

## 2012-03-30 LAB — CBC
MCHC: 34.9 g/dL (ref 30.0–36.0)
Platelets: 153 10*3/uL (ref 150–400)
RDW: 14 % (ref 11.5–15.5)
WBC: 8.2 10*3/uL (ref 4.0–10.5)

## 2012-03-30 LAB — HEMOGLOBIN AND HEMATOCRIT, BLOOD
HCT: 33.4 % — ABNORMAL LOW (ref 36.0–46.0)
Hemoglobin: 10.7 g/dL — ABNORMAL LOW (ref 12.0–15.0)
Hemoglobin: 10.1 g/dL — ABNORMAL LOW (ref 12.0–15.0)

## 2012-03-30 LAB — GLUCOSE, CAPILLARY
Glucose-Capillary: 124 mg/dL — ABNORMAL HIGH (ref 70–99)
Glucose-Capillary: 139 mg/dL — ABNORMAL HIGH (ref 70–99)
Glucose-Capillary: 164 mg/dL — ABNORMAL HIGH (ref 70–99)
Glucose-Capillary: 70 mg/dL (ref 70–99)

## 2012-03-30 LAB — HEMOGLOBIN A1C: Hgb A1c MFr Bld: 7.2 % — ABNORMAL HIGH (ref <5.7)

## 2012-03-30 LAB — LIPASE, BLOOD: Lipase: 17 U/L (ref 11–59)

## 2012-03-30 LAB — MRSA PCR SCREENING: MRSA by PCR: NEGATIVE

## 2012-03-30 MED ORDER — CHLORHEXIDINE GLUCONATE 0.12 % MT SOLN
15.0000 mL | Freq: Two times a day (BID) | OROMUCOSAL | Status: DC
  Administered 2012-03-30 – 2012-04-02 (×7): 15 mL via OROMUCOSAL
  Filled 2012-03-30 (×8): qty 15

## 2012-03-30 MED ORDER — DEXTROSE-NACL 5-0.9 % IV SOLN
INTRAVENOUS | Status: DC
  Administered 2012-03-30 (×2): via INTRAVENOUS
  Administered 2012-04-01: 75 mL/h via INTRAVENOUS

## 2012-03-30 MED ORDER — PANTOPRAZOLE SODIUM 40 MG IV SOLR
40.0000 mg | Freq: Two times a day (BID) | INTRAVENOUS | Status: DC
Start: 2012-03-30 — End: 2012-04-02
  Administered 2012-03-30 – 2012-04-02 (×7): 40 mg via INTRAVENOUS
  Filled 2012-03-30 (×8): qty 40

## 2012-03-30 MED ORDER — BIOTENE DRY MOUTH MT LIQD
15.0000 mL | Freq: Two times a day (BID) | OROMUCOSAL | Status: DC
  Administered 2012-03-30 – 2012-04-01 (×4): 15 mL via OROMUCOSAL

## 2012-03-30 MED ORDER — INSULIN ASPART 100 UNIT/ML ~~LOC~~ SOLN
0.0000 [IU] | Freq: Three times a day (TID) | SUBCUTANEOUS | Status: DC
  Administered 2012-03-30: 3 [IU] via SUBCUTANEOUS
  Administered 2012-03-31: 2 [IU] via SUBCUTANEOUS
  Administered 2012-03-31: 3 [IU] via SUBCUTANEOUS
  Administered 2012-03-31: 5 [IU] via SUBCUTANEOUS
  Administered 2012-04-01: 11 [IU] via SUBCUTANEOUS
  Administered 2012-04-01: 5 [IU] via SUBCUTANEOUS
  Administered 2012-04-01: 2 [IU] via SUBCUTANEOUS
  Administered 2012-04-02 (×2): 3 [IU] via SUBCUTANEOUS

## 2012-03-30 MED ORDER — INSULIN ASPART 100 UNIT/ML ~~LOC~~ SOLN
0.0000 [IU] | SUBCUTANEOUS | Status: DC
  Administered 2012-03-30 (×2): 1 [IU] via SUBCUTANEOUS

## 2012-03-30 MED ORDER — INSULIN ASPART 100 UNIT/ML ~~LOC~~ SOLN
0.0000 [IU] | Freq: Every day | SUBCUTANEOUS | Status: DC
  Administered 2012-04-01: 2 [IU] via SUBCUTANEOUS

## 2012-03-30 NOTE — Progress Notes (Cosign Needed)
[facility] hospitalist progress note. Chief complaint. Upper GI bleed. History of present illness. This 78 year old female with a past medical history of hypertension, diabetes and depression was admitted through [facility] long emergency room following 2 weeks of diarrhea. At admission time there had been no nausea or vomiting. When the patient arrived to the floor nursing staff noted patient to have a large amount of emesis. This was followed by a second incident of emesis this time a mixed with bright red blood. The patient denies any GI pain and states that her nausea is now better post vomiting. She denies any prior history of GI bleed, GI ulcers, etc. She has never seen a gastroenterologist. I came to the bedside to see the patient following this report of bright red blood with emesis. Vital signs. Temperature 97.8, pulse 73, respiration 18, blood pressure 140/47. O2 sats 97%. Gen. appearance. Well-developed elderly female who looks younger than her stated age. She is alert, oriented, pleasant. Cardiac. Rate and rhythm regular. Lungs. Breath sounds are clear and equal bilaterally. Abdomen. Soft and moderately obese with positive bowel sounds. No pain, guarding, rebound tenderness. Impression/plan. Problem #1 upper GI bleed. I have the discontinued patient's aspirin and Lovenox. We'll use SCDs for DVT prophylactics. I have made the patient n.p.o. Started on a Protonix drip at 8 mg per hour. Hemoglobin and hematocrit now and then every 6 hours until further notice. Patient's admission the hemoglobin was 11.2. I have contacted [place] gastro[facility] Dr. Rhea Belton who agrees with the above interventions and with no further recommendations at this time. Gastroenterology will consult on the patient later this a.m. Will move the patient to a step down bed for closer monitoring. Problem #2 diabetes. Again I have made the patient n.p.o. secondary to emesis with bright red blood. Because of this I have discontinued her  Lantus insulin. I requested CBGs be checked every 4 hours and have left a sensitive NovoLog sliding scale insulin for coverage. If there is any problem with hypoglycemia per CBGs would consider adding dextrose to IV fluids.

## 2012-03-30 NOTE — Consult Note (Signed)
Referring Provider: No ref. provider found Primary Care Physician:  Dr Ricki Miller Primary Gastroenterologist:  none  Reason for Consultation:  Hematemesis  HPI: Morgan Morris is a 78 y.o. female with history of hypertension diabetes, depression and previous history of hysterectomy and cholecystectomy. Patient has not had any prior GI evaluation as far she is aware.  She was admitted yesterday after a 5-6 day history of aggressive diarrhea at home. She said she was having bowel movements about every hour over the past few days. Apparently this was not associated with nausea or vomiting at home. Did not have any documented fever or chills and had no complaints of abdominal pain or cramping. Patient was unaware of any melena or hematochezia. She has not been on any new medications and has not had any recent antibiotics. She has had no sick exposures.  After admission last evening patient had an episode of vomiting and says she vomited a lot,, this was followed by an episode of hematemesis of what she describes as red blood. She says she vomited perhaps a half a cup. She has not had any further nausea or vomiting since has no complaints of heartburn or indigestion no nausea, no dysphagia or odynophagia. She has been on a regular aspirin at home no other blood thinners and no NSAIDs.  Hemoglobin on admission was 11.2, hemoglobin this a.m. 10.9. Since admission her diarrhea has improved she has had 2 smaller volume bowel movements this morning. Her stool for C. difficile by PCR is negative, cultures are pending.   Past Medical History  Diagnosis Date  . Hypertension   . Diabetes mellitus   . Depression     Past Surgical History  Procedure Date  . Abdominal hysterectomy     Prior to Admission medications   Medication Sig Start Date End Date Taking? Authorizing Provider  aspirin EC 325 MG tablet Take 325 mg by mouth daily at 12 noon.   Yes Historical Provider, MD  b complex vitamins tablet Take 1  tablet by mouth daily at 12 noon.   Yes Historical Provider, MD  Cholecalciferol (VITAMIN D3 PO) Take 1 tablet by mouth daily at 12 noon.   Yes Historical Provider, MD  digoxin (LANOXIN) 0.125 MG tablet Take 125 mcg by mouth daily at 12 noon.   Yes Historical Provider, MD  escitalopram (LEXAPRO) 10 MG tablet Take 10 mg by mouth every morning.   Yes Historical Provider, MD  insulin glargine (LANTUS) 100 UNIT/ML injection Inject 40 Units into the skin at bedtime.   Yes Historical Provider, MD  insulin regular (NOVOLIN R,HUMULIN R) 100 units/mL injection Inject into the skin 3 (three) times daily before meals. She uses a sliding scale.   Yes Historical Provider, MD  losartan (COZAAR) 50 MG tablet Take 50 mg by mouth every morning.   Yes Historical Provider, MD  Multiple Vitamins-Minerals (PRESERVISION AREDS PO) Take 1 tablet by mouth daily at 12 noon.   Yes Historical Provider, MD  simvastatin (ZOCOR) 20 MG tablet Take 20 mg by mouth every evening.   Yes Historical Provider, MD  timolol (BETIMOL) 0.5 % ophthalmic solution Place 1 drop into both eyes 2 (two) times daily.   Yes Historical Provider, MD  ondansetron (ZOFRAN) 4 MG/2ML SOLN injection Inject 4 mg into the vein once.    Historical Provider, MD    Current Facility-Administered Medications  Medication Dose Route Frequency Provider Last Rate Last Dose  . acetaminophen (TYLENOL) tablet 650 mg  650 mg Oral Q6H PRN  Therisa Doyne, MD       Or  . acetaminophen (TYLENOL) suppository 650 mg  650 mg Rectal Q6H PRN Therisa Doyne, MD      . albuterol (PROVENTIL) (5 MG/ML) 0.5% nebulizer solution 2.5 mg  2.5 mg Nebulization Q2H PRN Therisa Doyne, MD      . alum & mag hydroxide-simeth (MAALOX/MYLANTA) 200-200-20 MG/5ML suspension 30 mL  30 mL Oral Q6H PRN Therisa Doyne, MD      . antiseptic oral rinse (BIOTENE) solution 15 mL  15 mL Mouth Rinse q12n4p Everett Graff, NP      . chlorhexidine (PERIDEX) 0.12 % solution 15 mL  15 mL  Mouth Rinse BID Everett Graff, NP   15 mL at 03/30/12 0807  . dextrose 5 %-0.9 % sodium chloride infusion   Intravenous Continuous Everett Graff, NP 75 mL/hr at 03/30/12 0622    . digoxin (LANOXIN) tablet 125 mcg  125 mcg Oral Q1200 Therisa Doyne, MD      . escitalopram (LEXAPRO) tablet 10 mg  10 mg Oral q morning - 10a Therisa Doyne, MD   10 mg at 03/30/12 0858  . guaiFENesin-dextromethorphan (ROBITUSSIN DM) 100-10 MG/5ML syrup 5 mL  5 mL Oral Q4H PRN Therisa Doyne, MD      . HYDROcodone-acetaminophen (NORCO) 5-325 MG per tablet 1-2 tablet  1-2 tablet Oral Q4H PRN Therisa Doyne, MD      . insulin aspart (novoLOG) injection 0-9 Units  0-9 Units Subcutaneous Q4H Everett Graff, NP   1 Units at 03/30/12 0807  . iohexol (OMNIPAQUE) 300 MG/ML solution 100 mL  100 mL Intravenous Once PRN Medication Radiologist, MD   100 mL at 03/29/12 1828  . ondansetron (ZOFRAN) 4 MG/2ML injection           . ondansetron (ZOFRAN) injection 4 mg  4 mg Intravenous Once Lyanne Co, MD   4 mg at 03/29/12 1400  . ondansetron (ZOFRAN) injection 4 mg  4 mg Intravenous Once Doug Sou, MD   4 mg at 03/29/12 1852  . ondansetron (ZOFRAN) tablet 4 mg  4 mg Oral Q6H PRN Therisa Doyne, MD       Or  . ondansetron (ZOFRAN) injection 4 mg  4 mg Intravenous Q6H PRN Therisa Doyne, MD      . pantoprazole (PROTONIX) 80 mg in sodium chloride 0.9 % 250 mL infusion  8 mg/hr Intravenous Continuous Everett Graff, NP 25 mL/hr at 03/30/12 0026 8 mg/hr at 03/30/12 0026  . saccharomyces boulardii (FLORASTOR) capsule 250 mg  250 mg Oral BID Therisa Doyne, MD   250 mg at 03/30/12 0857  . simvastatin (ZOCOR) tablet 20 mg  20 mg Oral QPM Anastassia Doutova, MD      . sodium chloride 0.9 % bolus 1,000 mL  1,000 mL Intravenous Once Lyanne Co, MD   500 mL at 03/29/12 1423  . timolol (BETIMOL) 0.5 % ophthalmic solution 1 drop  1 drop Both Eyes BID Therisa Doyne, MD   1 drop at 03/30/12 1000  .  DISCONTD: 0.9 %  sodium chloride infusion   Intravenous Continuous Therisa Doyne, MD 75 mL/hr at 03/29/12 2258    . DISCONTD: aspirin EC tablet 325 mg  325 mg Oral Q1200 Therisa Doyne, MD      . DISCONTD: enoxaparin (LOVENOX) injection 40 mg  40 mg Subcutaneous Q24H Therisa Doyne, MD      . DISCONTD: insulin glargine (LANTUS) injection 40 Units  40 Units Subcutaneous QHS Therisa Doyne, MD  40 Units at 03/29/12 2255    Allergies as of 03/29/2012 - Review Complete 03/29/2012  Allergen Reaction Noted  . Penicillins  03/29/2012  . Sulfa antibiotics  03/29/2012    Family History  Problem Relation Age of Onset  . Cancer Mother     History   Social History  . Marital Status: Single    Spouse Name: N/A    Number of Children: N/A  . Years of Education: N/A   Occupational History  . Not on file.   Social History Main Topics  . Smoking status: Never Smoker   . Smokeless tobacco: Not on file  . Alcohol Use: No  . Drug Use: No  . Sexually Active: No   Other Topics Concern  . Not on file   Social History Narrative  . No narrative on file    Review of Systems: Pertinent positive and negative review of systems were noted in the above HPI section.  All other review of systems was otherwise negative. Physical Exam: Vital signs in last 24 hours: Temp:  [97.8 F (36.6 C)-100.2 F (37.9 C)] 100.2 F (37.9 C) (04/08 0528) Pulse Rate:  [69-80] 80  (04/08 0528) Resp:  [18-20] 18  (04/08 0528) BP: (127-142)/(47-69) 142/51 mmHg (04/08 0528) SpO2:  [96 %-98 %] 96 % (04/08 0528) Weight:  [146 lb 14.4 oz (66.633 kg)] 146 lb 14.4 oz (66.633 kg) (04/07 2132) Last BM Date: 03/30/12 General:   Alert,  Well-developed, well-nourished, elderly and hard of hearing , pleasant and cooperative in NAD Head:  Normocephalic and atraumatic. Eyes:  Sclera clear, no icterus.   Conjunctiva pink. Ears:  Normal auditory acuity. Nose:  No deformity, discharge,  or lesions. Mouth:  No  deformity or lesions.   Neck:  Supple; no masses or thyromegaly. Lungs:  Clear throughout to auscultation.   No wheezes, crackles, or rhonchi. Heart:  Regular rate and rhythm; no murmurs, clicks, rubs,  or gallops. Abdomen:  Soft,nontender, BS active,nonpalp mass or hsm.   Rectal:  Deferred  Msk:  Symmetrical without gross deformities. . Pulses:  Normal pulses noted. Extremities:  Without clubbing or edema. Neurologic:  Alert and  oriented x4;  grossly normal neurologically., Hard of hearing Skin:  Intact without significant lesions or rashes.. Psych:  Alert and cooperative. Normal mood and affect.  Intake/Output from previous day: 04/07 0701 - 04/08 0700 In: 600.8 [I.V.:600.8] Out: -  Intake/Output this shift:    Lab Results:  Basename 03/30/12 1040 03/30/12 0356 03/30/12 0035 03/29/12 1427  WBC -- 8.2 -- 13.6*  HGB 10.3* 10.9*10.7* 11.6* --  HCT 29.9* 31.2*31.0* 33.4* --  PLT -- 153 -- 166   BMET  Basename 03/30/12 0356 03/29/12 1713 03/29/12 1427  NA 141 -- 138  K 3.7 -- 4.0  CL 107 -- 103  CO2 26 -- 26  GLUCOSE 83 -- 132*  BUN 37* -- 42*  CREATININE 1.40* 1.32* 1.31*  CALCIUM 8.9 -- 8.7   LFT  Basename 03/30/12 0356  PROT 6.7  ALBUMIN 3.4*  AST 18  ALT 12  ALKPHOS 48  BILITOT 0.5  BILIDIR --  IBILI --   PT/INR No results found for this basename: LABPROT:2,INR:2 in the last 72 hours Hepatitis Panel No results found for this basename: HEPBSAG,HCVAB,HEPAIGM,HEPBIGM in the last 72 hours    Studies/Results: Ct Abdomen Pelvis W Contrast  03/29/2012  *RADIOLOGY REPORT*  Clinical Data: Watery diarrhea for 2 days.  Diabetic. Hypertensive.  Prior hysterectomy.  CT ABDOMEN AND PELVIS WITH  CONTRAST  Technique:  Multidetector CT imaging of the abdomen and pelvis was performed following the standard protocol during bolus administration of intravenous contrast.  Contrast: OMNIPAQUE IOHEXOL 300 MG/ML  SOLN  Comparison: None.  Findings: Contrast filled top normal  sized stomach, small bowel loops and colon without extraluminal bowel inflammatory process, free fluid or free air.  Diverticula most notable sigmoid colon. Appendix not visualized.  Dilated intrahepatic and extrahepatic bile ducts without mass identified. This may be related to patient's age and post cholecystectomy state.  Correlation with laboratory studies recommended.  If there are elevated liver function studies than further investigation may be indicated.  Cardiomegaly.  Atherosclerotic type changes including aortic ectasia and branch vessel ectasia.  Moderate narrowing celiac artery and superior mesenteric artery.  Mild ectasia common iliac arteries.  Calcification right lobe liver without focal liver lesion otherwise noted.  Spleen top normal size without focal mass.  No pancreatic, adrenal or renal mass.  Marked endplate reactive changes L1-2.  This probably represents result of degenerative disease.  Infection is a secondary consideration, not excluded in the proper clinical setting. Scoliosis.  Hip joint degenerative changes.  Subcutaneous inflammation may be related to injection for diabetes.  IMPRESSION: Contrast filled top normal sized stomach, small bowel loops and colon without extraluminal bowel inflammatory process, free fluid or free air.  Diverticula most notable sigmoid colon.  Appendix not visualized.  Dilated intrahepatic and extrahepatic bile ducts without mass identified. This may be related to patient's age and post cholecystectomy state.  Correlation with laboratory studies recommended.  If there are elevated liver function studies than further investigation may be indicated.  Cardiomegaly.  Atherosclerotic type changes as noted above.  Marked endplate reactive changes L1-2.  This probably represents result of degenerative disease.  Infection is a secondary consideration, not excluded in the proper clinical setting.  Original Report Authenticated By: Fuller Canada, M.D.     IMPRESSION:  #52  78 year old female with acute diarrheal illness, likely viral and improving with supportive management. #2 nausea vomiting x1 with a single episode of hematemesis. Suspect she may have had a Mallory-Weiss tear resuscitated by vomiting. She does not have any evidence of persistent active bleeding today. Cannot rule out esophagitis or peptic ulcer disease or gastric lesion. #3 mild normocytic anemia #4 diabetes mellitus #5   history of hypertension #6 renal insufficiency #7 dilated intra-and extrahepatic biliary ducts, patient is status post cholecystectomy and this likely reflects postcholecystectomy state   PLAN: #1 start clear liquids #2 IV PPI twice daily #3 serial CBCs #4 discussed upper endoscopy with the patient, she is currently reluctant to proceed very She states that at her age she does not want to have any procedures or medical care that is not absolutely necessary .She would like to discuss endoscopy with her family. As she is not actively bleeding this is very reasonable. We will keep her n.p.o. in a.m. and based on her course over the next 24 hours we'll decide if EGD is needed. #5 agree with stool cultures .   Tyrah Broers  03/30/2012, 11:07 AM

## 2012-03-30 NOTE — Consult Note (Signed)
I have taken a history, examined the patient and reviewed the chart. Hematemesis in setting of acute N/V/D. Probable viral syndrome with a Mallory Weiss tear. EGD is recommended to R/O other causes of UGI bleeding. Pt not sure she wants to proceed with EGD and will discuss with her family. If no more bleeding EGD would be less important but still reasonable. I agree with the extender's note, impression and recommendations.  Meryl Dare MD [facility]

## 2012-03-30 NOTE — Progress Notes (Signed)
UR completed 

## 2012-03-30 NOTE — Progress Notes (Signed)
Subjective: Pt without complaints, but is concerned about he ability to manage by herself at home. She expresses that she does not want to go to a skilled facility but is concerned about the cost of having someone come into her home more frequently. Pt is agreeable to EGD tomorrow.    Objective: Filed Vitals:   03/29/12 1855 03/29/12 2132 03/30/12 0528 03/30/12 1345  BP: 137/51 140/47 142/51 149/72  Pulse: 71 73 80 67  Temp: 98.9 F (37.2 C) 97.8 F (36.6 C) 100.2 F (37.9 C) 98.1 F (36.7 C)  TempSrc: Oral Oral Oral Oral  Resp: 20 18 18 18   Height:  5\' 2"  (1.575 m)    Weight:  66.633 kg (146 lb 14.4 oz)    SpO2: 98% 97% 96% 100%   Weight change:   Intake/Output Summary (Last 24 hours) at 03/30/12 1622 Last data filed at 03/30/12 1345  Gross per 24 hour  Intake 1338.33 ml  Output      0 ml  Net 1338.33 ml    General: Alert, awake, oriented x3, in no acute distress.  HEENT: [place]/AT PEERL, EOMI Neck: Trachea midline,  no masses, no thyromegal,y no JVD, no carotid bruit OROPHARYNX:  Moist, No exudate/ erythema/lesions.  Heart: Regular rate and rhythm, without murmurs, rubs, gallops, PMI non-displaced, no heaves or thrills on palpation.  Lungs: Clear to auscultation, no wheezing or rhonchi noted. No increased vocal fremitus resonant to percussion  Abdomen: Soft, nontender, nondistended, positive bowel sounds, no masses no hepatosplenomegaly noted..  Neuro: No focal neurological deficits noted cranial nerves II through XII grossly intact. DTRs 2+ bilaterally upper and lower extremities. Moves all extremities against geravity. Musculoskeletal: No warm swelling or erythema around joints, no spinal tenderness noted. Psychiatric: Patient alert and oriented x3, good insight and cognition, good recent to remote recall.      Lab Results:  Basename 03/30/12 0356 03/29/12 1713 03/29/12 1427  NA 141 -- 138  K 3.7 -- 4.0  CL 107 -- 103  CO2 26 -- 26  GLUCOSE 83 -- 132*  BUN 37* --  42*  CREATININE 1.40* 1.32* --  CALCIUM 8.9 -- 8.7  MG 2.2 -- --  PHOS 4.2 -- --    Basename 03/30/12 0356 03/29/12 1427  AST 18 15  ALT 12 11  ALKPHOS 48 53  BILITOT 0.5 0.6  PROT 6.7 6.9  ALBUMIN 3.4* 3.7    Basename 03/30/12 0356 03/29/12 1427  LIPASE 17 127*  AMYLASE -- --    Basename 03/30/12 1040 03/30/12 0356 03/29/12 1427  WBC -- 8.2 13.6*  NEUTROABS -- -- --  HGB 10.3* 10.9*10.7* --  HCT 29.9* 31.2*31.0* --  MCV -- 92.9 93.4  PLT -- 153 166    Basename 03/29/12 1427  CKTOTAL --  CKMB --  CKMBINDEX --  TROPONINI <0.30   No components found with this basename: POCBNP:3 No results found for this basename: DDIMER:2 in the last 72 hours No results found for this basename: HGBA1C:2 in the last 72 hours No results found for this basename: CHOL:2,HDL:2,LDLCALC:2,TRIG:2,CHOLHDL:2,LDLDIRECT:2 in the last 72 hours  Basename 03/30/12 0356  TSH 0.331*  T4TOTAL --  T3FREE --  THYROIDAB --   No results found for this basename: VITAMINB12:2,FOLATE:2,FERRITIN:2,TIBC:2,IRON:2,RETICCTPCT:2 in the last 72 hours  Micro Results: Recent Results (from the past 240 hour(s))  CLOSTRIDIUM DIFFICILE BY PCR     Status: Normal   Collection Time   03/29/12  5:41 PM      Component Value Range Status  Comment   C difficile by pcr NEGATIVE  NEGATIVE  Final   MRSA PCR SCREENING     Status: Normal   Collection Time   03/30/12  1:23 AM      Component Value Range Status Comment   MRSA by PCR NEGATIVE  NEGATIVE  Final     Studies/Results: Ct Abdomen Pelvis W Contrast  03/29/2012  *RADIOLOGY REPORT*  Clinical Data: Watery diarrhea for 2 days.  Diabetic. Hypertensive.  Prior hysterectomy.  CT ABDOMEN AND PELVIS WITH CONTRAST  Technique:  Multidetector CT imaging of the abdomen and pelvis was performed following the standard protocol during bolus administration of intravenous contrast.  Contrast: OMNIPAQUE IOHEXOL 300 MG/ML  SOLN  Comparison: None.  Findings: Contrast filled top  normal sized stomach, small bowel loops and colon without extraluminal bowel inflammatory process, free fluid or free air.  Diverticula most notable sigmoid colon. Appendix not visualized.  Dilated intrahepatic and extrahepatic bile ducts without mass identified. This may be related to patient's age and post cholecystectomy state.  Correlation with laboratory studies recommended.  If there are elevated liver function studies than further investigation may be indicated.  Cardiomegaly.  Atherosclerotic type changes including aortic ectasia and branch vessel ectasia.  Moderate narrowing celiac artery and superior mesenteric artery.  Mild ectasia common iliac arteries.  Calcification right lobe liver without focal liver lesion otherwise noted.  Spleen top normal size without focal mass.  No pancreatic, adrenal or renal mass.  Marked endplate reactive changes L1-2.  This probably represents result of degenerative disease.  Infection is a secondary consideration, not excluded in the proper clinical setting. Scoliosis.  Hip joint degenerative changes.  Subcutaneous inflammation may be related to injection for diabetes.  IMPRESSION: Contrast filled top normal sized stomach, small bowel loops and colon without extraluminal bowel inflammatory process, free fluid or free air.  Diverticula most notable sigmoid colon.  Appendix not visualized.  Dilated intrahepatic and extrahepatic bile ducts without mass identified. This may be related to patient's age and post cholecystectomy state.  Correlation with laboratory studies recommended.  If there are elevated liver function studies than further investigation may be indicated.  Cardiomegaly.  Atherosclerotic type changes as noted above.  Marked endplate reactive changes L1-2.  This probably represents result of degenerative disease.  Infection is a secondary consideration, not excluded in the proper clinical setting.  Original Report Authenticated By: Fuller Canada, M.D.     Medications: I have reviewed the patient's current medications. Scheduled Meds:   . antiseptic oral rinse  15 mL Mouth Rinse q12n4p  . chlorhexidine  15 mL Mouth Rinse BID  . digoxin  125 mcg Oral Q1200  . escitalopram  10 mg Oral q morning - 10a  . insulin aspart  0-15 Units Subcutaneous TID WC  . insulin aspart  0-5 Units Subcutaneous QHS  . ondansetron  4 mg Intravenous Once  . pantoprazole (PROTONIX) IV  40 mg Intravenous Q12H  . saccharomyces boulardii  250 mg Oral BID  . simvastatin  20 mg Oral QPM  . timolol  1 drop Both Eyes BID  . DISCONTD: aspirin EC  325 mg Oral Q1200  . DISCONTD: enoxaparin  40 mg Subcutaneous Q24H  . DISCONTD: insulin aspart  0-9 Units Subcutaneous Q4H  . DISCONTD: insulin glargine  40 Units Subcutaneous QHS   Continuous Infusions:   . dextrose 5 % and 0.9% NaCl 75 mL/hr at 03/30/12 0622  . DISCONTD: sodium chloride 75 mL/hr at 03/29/12 2258  .  DISCONTD: pantoprozole (PROTONIX) infusion 8 mg/hr (03/30/12 0026)   PRN Meds:.acetaminophen, acetaminophen, albuterol, alum & mag hydroxide-simeth, guaiFENesin-dextromethorphan, HYDROcodone-acetaminophen, iohexol, ondansetron (ZOFRAN) IV, ondansetron Assessment/Plan: Patient Active Hospital Problem List: Diarrhea (03/29/2012)    Assessment: C. Diff negative.   Dehydration (03/29/2012)   Assessment: Improved with IVF  Elevated lipase (03/29/2012)   Assessment:  Now normal. Pt tolerating full liquids.   Nausea (03/29/2012)   Assessment: Resolved.  Leukocytosis (03/29/2012)   Assessment: resolved. Likely secondary to hemo concenetration.   Anemia (03/29/2012)   Assessment: Hb stable no further bleeding noted.   Hematemesis (03/30/2012)   Assessment: ? Mallory-Weiss tear. No further bleeding.     LOS: 1 day

## 2012-03-31 ENCOUNTER — Encounter (HOSPITAL_COMMUNITY)
Admission: EM | Disposition: A | Discharge status: Home / Self Care | Payer: Self-pay | Source: Home / Self Care | Attending: Internal Medicine

## 2012-03-31 ENCOUNTER — Encounter (HOSPITAL_COMMUNITY): Payer: Self-pay | Admitting: *Deleted

## 2012-03-31 HISTORY — PX: ESOPHAGOGASTRODUODENOSCOPY: SHX5428

## 2012-03-31 LAB — GLUCOSE, CAPILLARY: Glucose-Capillary: 185 mg/dL — ABNORMAL HIGH (ref 70–99)

## 2012-03-31 LAB — HEMOGLOBIN AND HEMATOCRIT, BLOOD
HCT: 29.7 % — ABNORMAL LOW (ref 36.0–46.0)
HCT: 31.2 % — ABNORMAL LOW (ref 36.0–46.0)

## 2012-03-31 SURGERY — EGD (ESOPHAGOGASTRODUODENOSCOPY)
Anesthesia: Moderate Sedation

## 2012-03-31 MED ORDER — FENTANYL NICU IV SYRINGE 50 MCG/ML
INJECTION | INTRAMUSCULAR | Status: DC | PRN
  Administered 2012-03-31: 25 ug via INTRAVENOUS

## 2012-03-31 MED ORDER — LOPERAMIDE HCL 2 MG PO CAPS
2.0000 mg | ORAL_CAPSULE | Freq: Four times a day (QID) | ORAL | Status: DC | PRN
  Administered 2012-04-01: 2 mg via ORAL
  Filled 2012-03-31: qty 1

## 2012-03-31 MED ORDER — MIDAZOLAM HCL 10 MG/2ML IJ SOLN
INTRAMUSCULAR | Status: DC | PRN
  Administered 2012-03-31: 2 mg via INTRAVENOUS

## 2012-03-31 MED ORDER — BUTAMBEN-TETRACAINE-BENZOCAINE 2-2-14 % EX AERO
INHALATION_SPRAY | CUTANEOUS | Status: DC | PRN
  Administered 2012-03-31: 2 via TOPICAL

## 2012-03-31 NOTE — Evaluation (Signed)
Occupational Therapy Evaluation Patient Details Name: Morgan Morris MRN: [id number] DOB: 16-Jun-1920 Today's Date: 03/31/2012  Problem List:  Patient Active Problem List  Diagnoses  . Diarrhea  . Dehydration  . Elevated lipase  . Nausea  . Leukocytosis  . Anemia  . Hematemesis    Past Medical History:  Past Medical History  Diagnosis Date  . Hypertension   . Diabetes mellitus   . Depression    Past Surgical History:  Past Surgical History  Procedure Date  . Abdominal hysterectomy   . Appendectomy   . Cholecystectomy     OT Assessment/Plan/Recommendation OT Assessment Clinical Impression Statement: This 78 year old female was admitted wtih diarrhea.  She resides in an ALF and is modified I at baseline with ADLs.  She is currently set up to min A for ADLs and is appropriate for skilled OT with supervision to modified I goals OT Recommendation/Assessment: Patient will need skilled OT in the acute care venue OT Problem List: Decreased strength;Decreased activity tolerance;Impaired balance (sitting and/or standing);Decreased safety awareness;Other (comment) (decreased safety may be due to urgency) OT Therapy Diagnosis : Generalized weakness OT Plan OT Frequency: Min 2X/week OT Treatment/Interventions: Self-care/ADL training;Energy conservation;Patient/family education;Balance training OT Recommendation Follow Up Recommendations: Home health OT;Other (comment) (at ALF as long as pt reaches supervision/mod I level) Equipment Recommended: None recommended by OT Individuals Consulted Consulted and Agree with Results and Recommendations: Patient OT Goals Acute Rehab OT Goals OT Goal Formulation: With patient Time For Goal Achievement: 2 weeks ADL Goals Pt Will Perform Grooming: with modified independence;Standing at sink ADL Goal: Grooming - Progress: Goal set today Pt Will Transfer to Toilet: with modified independence;Ambulation;Regular height toilet;Grab bars ADL Goal:  Toilet Transfer - Progress: Goal set today Pt Will Perform Tub/Shower Transfer: Shower transfer;with supervision;Ambulation;Shower seat with back ADL Goal: Web designer - Progress: Goal set today Miscellaneous OT Goals Miscellaneous OT Goal #1: Pt will gather clothes with supervision and perform adl on her own OT Goal: Miscellaneous Goal #1 - Progress: Goal set today Miscellaneous OT Goal #2: pt will verbalize 2 energy conservation strategies.   OT Goal: Miscellaneous Goal #2 - Progress: Goal set today  OT Evaluation Precautions/Restrictions  Precautions Precautions: Fall Restrictions Weight Bearing Restrictions: No Prior Functioning Home Living Lives With: Other (Comment) (ALF) Bathroom Shower/Tub: Walk-in Stage manager: Standard Home Adaptive Equipment: Shower chair without back Prior Function Level of Independence: Independent with basic ADLs;Requires assistive device for independence ADL ADL Eating/Feeding: Simulated;Independent;Other (comment) (npo for test) Where Assessed - Eating/Feeding: Edge of bed Grooming: Simulated;Set up Where Assessed - Grooming: Sitting, bed;Unsupported Upper Body Bathing: Performed;Set up Where Assessed - Upper Body Bathing: Sitting, bed;Unsupported Lower Body Bathing: Performed;Minimal assistance;Other (comment) (buttocks sore--from diarrhea; also assist with barrier cream) Where Assessed - Lower Body Bathing: Sit to stand from bed Upper Body Dressing: Performed;Minimal assistance;Other (comment) (iv) Where Assessed - Upper Body Dressing: Sitting, bed;Unsupported Lower Body Dressing: Performed;Minimal assistance;Simulated (min guard for balance; performed socks with set up) Where Assessed - Lower Body Dressing: Sit to stand from bed Toilet Transfer: Simulated;Minimal assistance (min guard) Toilet Transfer Method: Stand pivot Toileting - Clothing Manipulation: Simulated;Minimal assistance;Other (comment) (min guard) Where  Assessed - Toileting Clothing Manipulation: Sit to stand from 3-in-1 or toilet Toileting - Hygiene: Performed;Minimal assistance Where Assessed - Toileting Hygiene: Standing Tub/Shower Transfer: Not assessed Equipment Used: Rolling walker Ambulation Related to ADLs: nt:  spt transfer only ADL Comments: pt npo for test; feels a little weaker than baseline.  Normally independent at  ALF; goes to dining room.  Lost husband last year:  was married 65 years Vision/Perception  Vision - History Patient Visual Report: No change from baseline Cognition Cognition Arousal/Alertness: Awake/alert Overall Cognitive Status: Appears within functional limits for tasks assessed Orientation Level: Oriented X4 Cognition - Other Comments: RN has bed alarm set:  feels pt is impulsive at times; reminded to use call light--this may be due to urgency Sensation/Coordination   Extremity Assessment RUE Assessment RUE Assessment: Within Functional Limits LUE Assessment LUE Assessment: Within Functional Limits Mobility  Bed Mobility Bed Mobility: Yes Supine to Sit: 6: Modified independent (Device/Increase time);With rails;HOB elevated (Comment degrees) (20) Transfers Transfers: Yes Sit to Stand: 5: Supervision Exercises   End of Session OT - End of Session Activity Tolerance: Patient tolerated treatment well Patient left: in bed;with call bell in reach;with bed alarm set General Behavior During Session: [facility] for tasks performed Cognition: [facility] for tasks performed [facility], OTR/L [phone] 03/31/2012  Abbygayle Helfand 03/31/2012, 10:50 AM

## 2012-03-31 NOTE — Progress Notes (Signed)
PT Cancellation Note  Treatment cancelled today due to pt received a procedure this AM and is currently fast asleep.  Will see pt in AM for mobility and gait evaluation.  Ebony Hail [facility] 03/31/2012, 2:34 PM

## 2012-03-31 NOTE — Op Note (Signed)
[facility] [address] [place], [place]  [address]  ENDOSCOPY PROCEDURE REPORT  PATIENT:  Morgan Morris, Morgan Morris  MR#:  [id number] BIRTHDATE:  [birth year], 89 yrs. old  GENDER:  female ENDOSCOPIST:  Judie Petit T. Russella Dar, MD, [facility] Referred by:  [facility] PROCEDURE DATE:  03/31/2012 PROCEDURE:  EGD, diagnostic 19147 ASA CLASS:  Class II INDICATIONS:  hematemesis MEDICATIONS:  These medications were titrated to patient response per physician's verbal order, Fentanyl 25 mcg IV, Versed 2 mg IV TOPICAL ANESTHETIC:  Cetacaine Spray DESCRIPTION OF PROCEDURE:   After the risks benefits and alternatives of the procedure were thoroughly explained, informed consent was obtained.  The Pentax Gastroscope [id number] endoscope was introduced through the mouth and advanced to the second portion of the duodenum, without limitations.  The instrument was slowly withdrawn as the mucosa was fully examined. <<PROCEDUREIMAGES>> Esophagitis was found in the distal esophagus. It was erosive. LA Class Grade B.  Otherwise normal esophagus.  A tear was found in the cardia. Nonbleeding Malloy-Wiess tear. Otherwise normal stomach.  The duodenal bulb was normal in appearance, as was the postbulbar duodenum.    Retroflexed views revealed a Mallory-Weiss tear. No blood in UGI tract.  The scope was then withdrawn from the patient and the procedure completed.  COMPLICATIONS:  None  ENDOSCOPIC IMPRESSION: 1) Erosive esophagitis 2) Mallory-Wiess tear  RECOMMENDATIONS: 1) Anti-reflux regimen long term 2) PPI qam long term 3) Follow up with Dr. Ricki Miller following hospitalization, GI follow up prn  Judie Petit T. Russella Dar, MD, [facility]  CC:  Juline Patch, MD  n. Rosalie DoctorVenita Lick. Lavoris Sparling at 03/31/2012 11:34 AM  Geralyn Flash, [phone]

## 2012-03-31 NOTE — Progress Notes (Signed)
Subjective: Pt without complaints, but is concerned about he ability to manage by herself at home. She expresses that she does not want to go to a skilled facility. Patient states had diarrhea yesterday. No abdominal pain. No emesis.    Objective: Filed Vitals:   03/31/12 1136 03/31/12 1140 03/31/12 1150 03/31/12 1353  BP: 134/65 134/65 145/67 132/44  Pulse: 56   59  Temp:    97.7 F (36.5 C)  TempSrc:    Oral  Resp: 14 13 11 16   Height:      Weight:      SpO2: 99% 99% 100% 99%   Weight change:   Intake/Output Summary (Last 24 hours) at 03/31/12 1611 Last data filed at 03/31/12 1519  Gross per 24 hour  Intake    360 ml  Output    450 ml  Net    -90 ml    General: Alert, awake, oriented x3, in no acute distress.  HEENT: [place]/AT PEERL, EOMI Neck: Trachea midline,  no masses, no thyromegal,y no JVD, no carotid bruit OROPHARYNX:  Moist, No exudate/ erythema/lesions.  Heart: Regular rate and rhythm, without murmurs, rubs, gallops, PMI non-displaced, no heaves or thrills on palpation.  Lungs: Clear to auscultation, no wheezing or rhonchi noted. No increased vocal fremitus resonant to percussion  Abdomen: Soft, nontender, nondistended, positive bowel sounds, no masses no hepatosplenomegaly noted..  Neuro: No focal neurological deficits noted cranial nerves II through XII grossly intact. DTRs 2+ bilaterally upper and lower extremities. Moves all extremities against geravity. Musculoskeletal: No warm swelling or erythema around joints, no spinal tenderness noted. Psychiatric: Patient alert and oriented x3, good insight and cognition, good recent to remote recall.      Lab Results:  Basename 03/30/12 0356 03/29/12 1713 03/29/12 1427  NA 141 -- 138  K 3.7 -- 4.0  CL 107 -- 103  CO2 26 -- 26  GLUCOSE 83 -- 132*  BUN 37* -- 42*  CREATININE 1.40* 1.32* --  CALCIUM 8.9 -- 8.7  MG 2.2 -- --  PHOS 4.2 -- --    Basename 03/30/12 0356 03/29/12 1427  AST 18 15  ALT 12 11  ALKPHOS  48 53  BILITOT 0.5 0.6  PROT 6.7 6.9  ALBUMIN 3.4* 3.7    Basename 03/30/12 0356 03/29/12 1427  LIPASE 17 127*  AMYLASE -- --    Basename 03/31/12 0507 03/30/12 1755 03/30/12 0356 03/29/12 1427  WBC -- -- 8.2 13.6*  NEUTROABS -- -- -- --  HGB 9.9* 10.1* -- --  HCT 29.7* 29.7* -- --  MCV -- -- 92.9 93.4  PLT -- -- 153 166    Basename 03/29/12 1427  CKTOTAL --  CKMB --  CKMBINDEX --  TROPONINI <0.30   No components found with this basename: POCBNP:3 No results found for this basename: DDIMER:2 in the last 72 hours  Basename 03/30/12 1600  HGBA1C 7.2*   No results found for this basename: CHOL:2,HDL:2,LDLCALC:2,TRIG:2,CHOLHDL:2,LDLDIRECT:2 in the last 72 hours  Basename 03/30/12 0356  TSH 0.331*  T4TOTAL --  T3FREE --  THYROIDAB --   No results found for this basename: VITAMINB12:2,FOLATE:2,FERRITIN:2,TIBC:2,IRON:2,RETICCTPCT:2 in the last 72 hours  Micro Results: Recent Results (from the past 240 hour(s))  CLOSTRIDIUM DIFFICILE BY PCR     Status: Normal   Collection Time   03/29/12  5:41 PM      Component Value Range Status Comment   C difficile by pcr NEGATIVE  NEGATIVE  Final   MRSA PCR SCREENING  Status: Normal   Collection Time   03/30/12  1:23 AM      Component Value Range Status Comment   MRSA by PCR NEGATIVE  NEGATIVE  Final     Studies/Results: Ct Abdomen Pelvis W Contrast  03/29/2012  *RADIOLOGY REPORT*  Clinical Data: Watery diarrhea for 2 days.  Diabetic. Hypertensive.  Prior hysterectomy.  CT ABDOMEN AND PELVIS WITH CONTRAST  Technique:  Multidetector CT imaging of the abdomen and pelvis was performed following the standard protocol during bolus administration of intravenous contrast.  Contrast: OMNIPAQUE IOHEXOL 300 MG/ML  SOLN  Comparison: None.  Findings: Contrast filled top normal sized stomach, small bowel loops and colon without extraluminal bowel inflammatory process, free fluid or free air.  Diverticula most notable sigmoid colon.  Appendix not visualized.  Dilated intrahepatic and extrahepatic bile ducts without mass identified. This may be related to patient's age and post cholecystectomy state.  Correlation with laboratory studies recommended.  If there are elevated liver function studies than further investigation may be indicated.  Cardiomegaly.  Atherosclerotic type changes including aortic ectasia and branch vessel ectasia.  Moderate narrowing celiac artery and superior mesenteric artery.  Mild ectasia common iliac arteries.  Calcification right lobe liver without focal liver lesion otherwise noted.  Spleen top normal size without focal mass.  No pancreatic, adrenal or renal mass.  Marked endplate reactive changes L1-2.  This probably represents result of degenerative disease.  Infection is a secondary consideration, not excluded in the proper clinical setting. Scoliosis.  Hip joint degenerative changes.  Subcutaneous inflammation may be related to injection for diabetes.  IMPRESSION: Contrast filled top normal sized stomach, small bowel loops and colon without extraluminal bowel inflammatory process, free fluid or free air.  Diverticula most notable sigmoid colon.  Appendix not visualized.  Dilated intrahepatic and extrahepatic bile ducts without mass identified. This may be related to patient's age and post cholecystectomy state.  Correlation with laboratory studies recommended.  If there are elevated liver function studies than further investigation may be indicated.  Cardiomegaly.  Atherosclerotic type changes as noted above.  Marked endplate reactive changes L1-2.  This probably represents result of degenerative disease.  Infection is a secondary consideration, not excluded in the proper clinical setting.  Original Report Authenticated By: Fuller Canada, M.D.    Medications: I have reviewed the patient's current medications. Scheduled Meds:    . antiseptic oral rinse  15 mL Mouth Rinse q12n4p  . chlorhexidine  15 mL Mouth  Rinse BID  . digoxin  125 mcg Oral Q1200  . escitalopram  10 mg Oral q morning - 10a  . insulin aspart  0-15 Units Subcutaneous TID WC  . insulin aspart  0-5 Units Subcutaneous QHS  . pantoprazole (PROTONIX) IV  40 mg Intravenous Q12H  . saccharomyces boulardii  250 mg Oral BID  . simvastatin  20 mg Oral QPM  . timolol  1 drop Both Eyes BID   Continuous Infusions:    . dextrose 5 % and 0.9% NaCl 75 mL/hr at 03/30/12 1836   PRN Meds:.acetaminophen, acetaminophen, albuterol, alum & mag hydroxide-simeth, guaiFENesin-dextromethorphan, HYDROcodone-acetaminophen, loperamide, ondansetron (ZOFRAN) IV, ondansetron, DISCONTD: butamben-tetracaine-benzocaine, DISCONTD: fentaNYL, DISCONTD: midazolam Assessment/Plan: Patient Active Hospital Problem List: Diarrhea (03/29/2012)    Assessment:  Likely secondary to viral etiology. C. Diff negative. Patient started om immodium per GI.  Dehydration (03/29/2012)   Assessment: Improved with IVF  Elevated lipase (03/29/2012)   Assessment:  Now normal. Pt tolerating full liquids.   Nausea (03/29/2012)  Assessment: Resolved.  Leukocytosis (03/29/2012)   Assessment: resolved. Likely secondary to hemo concenetration.   Anemia (03/29/2012)   Assessment: Hb stable no further bleeding noted.   Hematemesis (03/30/2012)   Assessment:  Secondary to  Mallory-Weiss tear per EGD. No further bleeding. Follow H/H.  Erosive esophagitis PPI daily per GI.     LOS: 2 days

## 2012-03-31 NOTE — Progress Notes (Signed)
      [facility] Progress Note  Subjective: Diarrhea has been worse again overnight. No c/o abdominal pain. No further nausea/vomiting or hematemesis. She is agreeable to EGD after talking with her family  Objective:  Vital signs in last 24 hours: Temp:  [98.1 F (36.7 C)-98.3 F (36.8 C)] 98.1 F (36.7 C) (04/09 0438) Pulse Rate:  [65-81] 81  (04/09 0438) Resp:  [15-18] 17  (04/09 0438) BP: (146-155)/(55-72) 155/55 mmHg (04/09 0438) SpO2:  [98 %-100 %] 98 % (04/09 0438) Last BM Date: 03/30/12 General:   Alert,  Well-developed,    in NAD Heart:  Regular rate and rhythm; no murmurs Pulm;clear Abdomen:  Soft, nontender and nondistended. Normal bowel sounds,  Extremities:  Without edema. Neurologic:  Alert and  oriented x4;  grossly normal neurologically. Psych:  Alert and cooperative. Normal mood and affect.  Intake/Output from previous day: 04/08 0701 - 04/09 0700 In: 1097.5 [P.O.:600; I.V.:497.5] Out: -  Intake/Output this shift:    Lab Results:  Basename 03/31/12 0507 03/30/12 1755 03/30/12 1040 03/30/12 0356 03/29/12 1427  WBC -- -- -- 8.2 13.6*  HGB 9.9* 10.1* 10.3* -- --  HCT 29.7* 29.7* 29.9* -- --  PLT -- -- -- 153 166   BMET  Basename 03/30/12 0356 03/29/12 1713 03/29/12 1427  NA 141 -- 138  K 3.7 -- 4.0  CL 107 -- 103  CO2 26 -- 26  GLUCOSE 83 -- 132*  BUN 37* -- 42*  CREATININE 1.40* 1.32* 1.31*  CALCIUM 8.9 -- 8.7   LFT  Basename 03/30/12 0356  PROT 6.7  ALBUMIN 3.4*  AST 18  ALT 12  ALKPHOS 48  BILITOT 0.5  BILIDIR --  IBILI --     Assessment / Plan: #1 78 yo female with self limited hematemesis-probable Mallory -Weiss tear secondary to forceful vomiting. No active bleeding ,but hgb has drifted, Will proceed with EGD today Hopefully can advance diet after EGD #2 Acute diarrheal illness-likley viral. CDiff negative. Will start immodium. Principal Problem:  *Diarrhea Active Problems:  Dehydration  Elevated lipase  Nausea  Leukocytosis  Anemia  Hematemesis     LOS: 2 days   Rotunda Worden  03/31/2012, 9:58 AM

## 2012-03-31 NOTE — Progress Notes (Signed)
I have taken an interval history, reviewed the chart and examined the patient. I agree with the extender's note, impression and recommendations.   Ahnesty Finfrock T. Marney Treloar MD FACG 

## 2012-03-31 NOTE — Interval H&P Note (Signed)
History and Physical Interval Note:  03/31/2012 11:04 AM  Morgan Morris  has presented today for surgery, with the diagnosis of hematemesis  The various methods of treatment have been discussed with the patient and family. After consideration of risks, benefits and other options for treatment, the patient has consented to  Procedure(s) (LRB): ESOPHAGOGASTRODUODENOSCOPY (EGD) (N/A) as a surgical intervention .  The patients' history has been reviewed, patient examined, no change in status, stable for surgery.  I have reviewed the patients' chart and labs.  Questions were answered to the patient's satisfaction.     Venita Lick. Russella Dar MD [facility]

## 2012-04-01 ENCOUNTER — Encounter (HOSPITAL_COMMUNITY): Payer: Self-pay

## 2012-04-01 ENCOUNTER — Encounter (HOSPITAL_COMMUNITY): Payer: Self-pay | Admitting: Gastroenterology

## 2012-04-01 LAB — HEMOGLOBIN AND HEMATOCRIT, BLOOD: Hemoglobin: 9.9 g/dL — ABNORMAL LOW (ref 12.0–15.0)

## 2012-04-01 LAB — BASIC METABOLIC PANEL
BUN: 19 mg/dL (ref 6–23)
Calcium: 8.3 mg/dL — ABNORMAL LOW (ref 8.4–10.5)
Chloride: 104 mEq/L (ref 96–112)
Creatinine, Ser: 1.2 mg/dL — ABNORMAL HIGH (ref 0.50–1.10)
GFR calc Af Amer: 45 mL/min — ABNORMAL LOW (ref >90)
GFR calc non Af Amer: 39 mL/min — ABNORMAL LOW (ref >90)

## 2012-04-01 LAB — GLUCOSE, CAPILLARY
Glucose-Capillary: 168 mg/dL — ABNORMAL HIGH (ref 70–99)
Glucose-Capillary: 139 mg/dL — ABNORMAL HIGH (ref 70–99)

## 2012-04-01 MED ORDER — SODIUM CHLORIDE 0.9 % IV SOLN
INTRAVENOUS | Status: AC
  Administered 2012-04-01: 17:00:00 via INTRAVENOUS

## 2012-04-01 MED ORDER — INSULIN GLARGINE 100 UNIT/ML ~~LOC~~ SOLN
20.0000 [IU] | Freq: Every day | SUBCUTANEOUS | Status: DC
  Administered 2012-04-01: 20 [IU] via SUBCUTANEOUS

## 2012-04-01 MED ORDER — POTASSIUM CHLORIDE CRYS ER 20 MEQ PO TBCR
40.0000 meq | EXTENDED_RELEASE_TABLET | Freq: Once | ORAL | Status: AC
  Administered 2012-04-01: 40 meq via ORAL
  Filled 2012-04-01: qty 2

## 2012-04-01 MED ORDER — LOPERAMIDE HCL 2 MG PO CAPS
2.0000 mg | ORAL_CAPSULE | Freq: Two times a day (BID) | ORAL | Status: DC
  Administered 2012-04-01 – 2012-04-02 (×2): 2 mg via ORAL
  Filled 2012-04-01 (×3): qty 1

## 2012-04-01 NOTE — Progress Notes (Signed)
I have taken an interval history, reviewed the chart and examined the patient. She should follow up with Dr. Ricki Miller for her GERD and resolving self-limted diarrheal illness. Follow up with GI as needed. I agree with the extender's note, impression and recommendations.   Venita Lick. Russella Dar MD [facility]

## 2012-04-01 NOTE — Progress Notes (Signed)
Subjective:  Patient states had diarrhea yesterday. Patient did not ask for imodium.No abdominal pain. No emesis. Tolerating oral intake. Family at bedside.    Objective: Filed Vitals:   03/31/12 2144 04/01/12 0627 04/01/12 1209 04/01/12 1315  BP: 157/84 160/57  140/48  Pulse: 63 76 72 65  Temp: 98.7 F (37.1 C) 98.9 F (37.2 C)  97.8 F (36.6 C)  TempSrc: Oral Oral    Resp: 16 18  18   Height:      Weight:      SpO2: 100% 99%  100%   Weight change:   Intake/Output Summary (Last 24 hours) at 04/01/12 1609 Last data filed at 04/01/12 1300  Gross per 24 hour  Intake    540 ml  Output    500 ml  Net     40 ml    General: Alert, awake, oriented x3, in no acute distress.  HEENT: [place]/AT PEERL, EOMI Neck: Trachea midline,  no masses, no thyromegal,y no JVD, no carotid bruit OROPHARYNX:  Moist, No exudate/ erythema/lesions.  Heart: Regular rate and rhythm, without murmurs, rubs, gallops, PMI non-displaced, no heaves or thrills on palpation.  Lungs: Clear to auscultation, no wheezing or rhonchi noted. No increased vocal fremitus resonant to percussion  Abdomen: Soft, nontender, nondistended, positive bowel sounds, no masses no hepatosplenomegaly noted..  Neuro: No focal neurological deficits noted cranial nerves II through XII grossly intact. DTRs 2+ bilaterally upper and lower extremities. Moves all extremities against geravity. Musculoskeletal: No warm swelling or erythema around joints, no spinal tenderness noted. Psychiatric: Patient alert and oriented x3, good insight and cognition, good recent to remote recall.      Lab Results:  [facility] 04/01/12 0525 03/30/12 0356  NA 136 141  K 3.4* 3.7  CL 104 107  CO2 24 26  GLUCOSE 282* 83  BUN 19 37*  CREATININE 1.20* 1.40*  CALCIUM 8.3* 8.9  MG -- 2.2  PHOS -- 4.2    Basename 03/30/12 0356  AST 18  ALT 12  ALKPHOS 48  BILITOT 0.5  PROT 6.7  ALBUMIN 3.4*    Basename 03/30/12 0356  LIPASE 17  AMYLASE --     Basename 04/01/12 0525 03/31/12 1810 03/30/12 0356  WBC -- -- 8.2  NEUTROABS -- -- --  HGB 9.9* 10.5* --  HCT 28.8* 31.2* --  MCV -- -- 92.9  PLT -- -- 153   No results found for this basename: CKTOTAL:3,CKMB:3,CKMBINDEX:3,TROPONINI:3 in the last 72 hours No components found with this basename: POCBNP:3 No results found for this basename: DDIMER:2 in the last 72 hours  Basename 03/30/12 1600  HGBA1C 7.2*   No results found for this basename: CHOL:2,HDL:2,LDLCALC:2,TRIG:2,CHOLHDL:2,LDLDIRECT:2 in the last 72 hours  Basename 03/30/12 0356  TSH 0.331*  T4TOTAL --  T3FREE --  THYROIDAB --   No results found for this basename: VITAMINB12:2,FOLATE:2,FERRITIN:2,TIBC:2,IRON:2,RETICCTPCT:2 in the last 72 hours  Micro Results: Recent Results (from the past 240 hour(s))  CLOSTRIDIUM DIFFICILE BY PCR     Status: Normal   Collection Time   03/29/12  5:41 PM      Component Value Range Status Comment   C difficile by pcr NEGATIVE  NEGATIVE  Final   MRSA PCR SCREENING     Status: Normal   Collection Time   03/30/12  1:23 AM      Component Value Range Status Comment   MRSA by PCR NEGATIVE  NEGATIVE  Final     Studies/Results: Ct Abdomen Pelvis W Contrast  03/29/2012  *  RADIOLOGY REPORT*  Clinical Data: Watery diarrhea for 2 days.  Diabetic. Hypertensive.  Prior hysterectomy.  CT ABDOMEN AND PELVIS WITH CONTRAST  Technique:  Multidetector CT imaging of the abdomen and pelvis was performed following the standard protocol during bolus administration of intravenous contrast.  Contrast: OMNIPAQUE IOHEXOL 300 MG/ML  SOLN  Comparison: None.  Findings: Contrast filled top normal sized stomach, small bowel loops and colon without extraluminal bowel inflammatory process, free fluid or free air.  Diverticula most notable sigmoid colon. Appendix not visualized.  Dilated intrahepatic and extrahepatic bile ducts without mass identified. This may be related to patient's age and post cholecystectomy  state.  Correlation with laboratory studies recommended.  If there are elevated liver function studies than further investigation may be indicated.  Cardiomegaly.  Atherosclerotic type changes including aortic ectasia and branch vessel ectasia.  Moderate narrowing celiac artery and superior mesenteric artery.  Mild ectasia common iliac arteries.  Calcification right lobe liver without focal liver lesion otherwise noted.  Spleen top normal size without focal mass.  No pancreatic, adrenal or renal mass.  Marked endplate reactive changes L1-2.  This probably represents result of degenerative disease.  Infection is a secondary consideration, not excluded in the proper clinical setting. Scoliosis.  Hip joint degenerative changes.  Subcutaneous inflammation may be related to injection for diabetes.  IMPRESSION: Contrast filled top normal sized stomach, small bowel loops and colon without extraluminal bowel inflammatory process, free fluid or free air.  Diverticula most notable sigmoid colon.  Appendix not visualized.  Dilated intrahepatic and extrahepatic bile ducts without mass identified. This may be related to patient's age and post cholecystectomy state.  Correlation with laboratory studies recommended.  If there are elevated liver function studies than further investigation may be indicated.  Cardiomegaly.  Atherosclerotic type changes as noted above.  Marked endplate reactive changes L1-2.  This probably represents result of degenerative disease.  Infection is a secondary consideration, not excluded in the proper clinical setting.  Original Report Authenticated By: Fuller Canada, M.D.    Medications: I have reviewed the patient's current medications. Scheduled Meds:    . antiseptic oral rinse  15 mL Mouth Rinse q12n4p  . chlorhexidine  15 mL Mouth Rinse BID  . digoxin  125 mcg Oral Q1200  . escitalopram  10 mg Oral q morning - 10a  . insulin aspart  0-15 Units Subcutaneous TID WC  . insulin aspart  0-5  Units Subcutaneous QHS  . loperamide  2 mg Oral BID  . pantoprazole (PROTONIX) IV  40 mg Intravenous Q12H  . potassium chloride  40 mEq Oral Once  . saccharomyces boulardii  250 mg Oral BID  . simvastatin  20 mg Oral QPM  . timolol  1 drop Both Eyes BID   Continuous Infusions:    . dextrose 5 % and 0.9% NaCl 75 mL/hr (04/01/12 0807)   PRN Meds:.acetaminophen, acetaminophen, albuterol, alum & mag hydroxide-simeth, guaiFENesin-dextromethorphan, HYDROcodone-acetaminophen, ondansetron (ZOFRAN) IV, ondansetron, DISCONTD: loperamide Assessment/Plan: Patient Active Hospital Problem List: Diarrhea (03/29/2012)    Assessment:  Likely secondary to viral etiology. C. Diff negative. Patient started om immodium per GI. Diet advanced per GI.  Dehydration (03/29/2012)   Assessment: Improved with IVF  Elevated lipase (03/29/2012)   Assessment:  Now normal. Pt tolerating full liquids, diet advanced.  Nausea (03/29/2012)   Assessment: Resolved.  Leukocytosis (03/29/2012)   Assessment: resolved. Likely secondary to hemo concenetration.   Anemia (03/29/2012)   Assessment: Hb stable no further bleeding noted.  Hematemesis (03/30/2012)   Assessment:  Secondary to  Mallory-Weiss tear per EGD. No further bleeding. Follow H/H.  Erosive esophagitis PPI daily per GI.  Diabetes Patient now on diet. Will resume  1/2 home dose lantus. SSI  Hypokalemia Replete     LOS: 3 days

## 2012-04-01 NOTE — Progress Notes (Signed)
CSW received a phone call from the RN case manager about the pt's son visiting facilities (SNF) for placement of him mom. Two messages were left by Tresa Endo and Casimiro Needle requesting Morgan Morris to aid him in his search. The son has not called back.  Kayleen Memos. Leighton Ruff [phone]

## 2012-04-01 NOTE — Progress Notes (Signed)
Patient ID: Morgan Morris, female   DOB: May 04, 1920, 78 y.o.   MRN: [id number] [facility] Progress Note  Subjective: Feeling better, eating without difficulty. No c/o pain, still having diarrhea but small volume. Had 3 BM's last night. She had not asked for any imodium.  Objective:  Vital signs in last 24 hours: Temp:  [97.7 F (36.5 C)-98.9 F (37.2 C)] 98.9 F (37.2 C) (04/10 0627) Pulse Rate:  [56-76] 76  (04/10 0627) Resp:  [10-20] 18  (04/10 0627) BP: (132-179)/(44-84) 160/57 mmHg (04/10 0627) SpO2:  [99 %-100 %] 99 % (04/10 0627) Last BM Date: 03/30/12 General:   Alert,  Well-developed,    in NAD,elderly, andHOH Heart:  Regular rate and rhythm; no murmurs Pulm;clear Abdomen:  Soft, nontender and nondistended. Normal bowel sounds, without guarding, and without rebound.   Extremities:  Without edema. Neurologic:  Alert and  oriented x4;  grossly normal neurologically. Psych:  Alert and cooperative. Normal mood and affect.  Intake/Output from previous day: 04/09 0701 - 04/10 0700 In: 60 [P.O.:60] Out: 750 [Urine:750] Intake/Output this shift: Total I/O In: 240 [P.O.:240] Out: -   Lab Results:  Basename 04/01/12 0525 03/31/12 1810 03/31/12 0507 03/30/12 0356 03/29/12 1427  WBC -- -- -- 8.2 13.6*  HGB 9.9* 10.5* 9.9* -- --  HCT 28.8* 31.2* 29.7* -- --  PLT -- -- -- 153 166   BMET  Basename 04/01/12 0525 03/30/12 0356 03/29/12 1713 03/29/12 1427  NA 136 141 -- 138  K 3.4* 3.7 -- 4.0  CL 104 107 -- 103  CO2 24 26 -- 26  GLUCOSE 282* 83 -- 132*  BUN 19 37* -- 42*  CREATININE 1.20* 1.40* 1.32* --  CALCIUM 8.3* 8.9 -- 8.7   LFT  Basename 03/30/12 0356  PROT 6.7  ALBUMIN 3.4*  AST 18  ALT 12  ALKPHOS 48  BILITOT 0.5  BILIDIR --  IBILI --   PT/INR No results found for this basename: LABPROT:2,INR:2 in the last 72 hours Hepatitis Panel No results found for this basename: HEPBSAG,HCVAB,HEPAIGM,HEPBIGM in the last 72 hours  Assessment /  Plan: #1 78 yo female with acute diarrheal illness-likely viral, slowly improving-will order imodium twice daily scheduled, and continue probiotic Advance diet to low residue,carb modified #2 hematemesis-resolved-erosive esophagitis, and small M-W tear on EGD Continue Protonix long term  We will sign off, call if can help Principal Problem:  *Diarrhea Active Problems:  Dehydration  Elevated lipase  Nausea  Leukocytosis  Anemia  Hematemesis     LOS: 3 days   Morgan Morris  04/01/2012, 10:08 AM

## 2012-04-01 NOTE — Evaluation (Signed)
Physical Therapy Evaluation Patient Details Name: Morgan Morris MRN: [id number] DOB: [birth year] Today's Date: 04/01/2012  Problem List:  Patient Active Problem List  Diagnoses  . Diarrhea  . Dehydration  . Elevated lipase  . Nausea  . Leukocytosis  . Anemia  . Hematemesis    Past Medical History:  Past Medical History  Diagnosis Date  . Hypertension   . Diabetes mellitus   . Depression    Past Surgical History:  Past Surgical History  Procedure Date  . Abdominal hysterectomy   . Appendectomy   . Cholecystectomy   . Esophagogastroduodenoscopy 03/31/2012    Procedure: ESOPHAGOGASTRODUODENOSCOPY (EGD);  Surgeon: Meryl Dare, MD,FACG;  Location: [facility];  Service: Endoscopy;  Laterality: N/A;    PT Assessment/Plan/Recommendation PT Assessment Clinical Impression Statement: Patient admitted with diarrhea presents with decreased independence with mobility, decreased activity tolerance, decreased balance and will benefit from skilled PT in the acute setting to maximize independence and safety with mobility for safe d/c to ALF with HHPT. PT Recommendation/Assessment: Patient will need skilled PT in the acute care venue PT Problem List: Decreased strength;Decreased activity tolerance;Decreased mobility;Decreased balance;Decreased safety awareness;Decreased knowledge of use of DME PT Therapy Diagnosis : Abnormality of gait;Generalized weakness PT Plan PT Frequency: Min 3X/week PT Treatment/Interventions: DME instruction;Gait training;Functional mobility training;Therapeutic exercise;Balance training;Therapeutic activities;Patient/family education PT Recommendation Follow Up Recommendations: Home health PT Equipment Recommended: Other (comment) (to be assessed for cane vs walker) PT Goals  Acute Rehab PT Goals PT Goal Formulation: With patient Time For Goal Achievement: 7 days Pt will go Sit to Stand: with modified independence PT Goal: Sit to Stand - Progress: Goal  set today Pt will go Stand to Sit: with modified independence PT Goal: Stand to Sit - Progress: Goal set today Pt will Stand: with modified independence;3 - 5 min;with no upper extremity support (during simple functional task) PT Goal: Stand - Progress: Goal set today Pt will Ambulate: >150 feet;with modified independence;with least restrictive assistive device PT Goal: Ambulate - Progress: Goal set today Pt will Perform Home Exercise Program: with supervision, verbal cues required/provided PT Goal: Perform Home Exercise Program - Progress: Goal set today  PT Evaluation Precautions/Restrictions  Precautions Precautions: Fall Prior Functioning  Home Living Lives With: Alone Type of Home: Assisted living Home Adaptive Equipment: Shower chair without back Prior Function Level of Independence: Independent with basic ADLs;Independent with gait;Independent with transfers;Needs assistance with homemaking Cognition Cognition Arousal/Alertness: Awake/alert Overall Cognitive Status: Appears within functional limits for tasks assessed Sensation/Coordination   Extremity Assessment RLE Assessment RLE Assessment: Within Functional Limits LLE Assessment LLE Assessment: Within Functional Limits Mobility (including Balance) Bed Mobility Supine to Sit: 6: Modified independent (Device/Increase time) Transfers Sit to Stand: 5: Supervision;From bed;With upper extremity assist;From chair/3-in-1 Sit to Stand Details (indicate cue type and reason): minguard to supervision for safety Stand to Sit: 5: Supervision;To bed;To chair/3-in-1;With upper extremity assist;Without upper extremity assist Stand to Sit Details: used armrests to sit to 3:1 sat with uncontrolled descent onto bed Ambulation/Gait Ambulation/Gait: Yes Ambulation/Gait Assistance: 4: Min assist;5: Supervision Ambulation/Gait Assistance Details (indicate cue type and reason): minguard to supervision with min lateral lean vs loss of  balance to left with minguard assist for recovery Ambulation Distance (Feet): 200 Feet (with one seated rest) Assistive device: None Gait Pattern: Step-through pattern;Decreased stride length  Posture/Postural Control Posture/Postural Control: No significant limitations Dynamic Standing Balance Dynamic Standing - Balance Support: No upper extremity supported Dynamic Standing - Level of Assistance: 5: Stand by assistance Dynamic Standing -  Comments: during donning mesh briefs Exercise    End of Session PT - End of Session Equipment Utilized During Treatment: Gait belt Activity Tolerance: Patient limited by fatigue Patient left: with call bell in reach;in bed;with family/visitor present General Behavior During Session: [facility] for tasks performed Cognition: [facility] for tasks performed  [facility] 04/01/2012, 11:16 AM

## 2012-04-01 NOTE — Progress Notes (Signed)
Inpatient Diabetes Program Recommendations  AACE/ADA: New Consensus Statement on Inpatient Glycemic Control (2009)  Target Ranges:  Prepandial:   less than 140 mg/dL      Peak postprandial:   less than 180 mg/dL (1-2 hours)      Critically ill patients:  140 - 180 mg/dL   Reason for Visit: Hyperglycemia  Results for Morgan Morris, Morgan Morris (MRN [id number]) as of 04/01/2012 10:54  Ref. Range 03/31/2012 07:39 03/31/2012 12:44 03/31/2012 17:41 03/31/2012 21:52 04/01/2012 07:40  Glucose-Capillary Latest Range: 70-99 mg/dL 409 (H) 811 (H) 914 (H) 168 (H) 322 (H)    Inpatient Diabetes Program Recommendations Insulin - Basal: Needs basal insulin - Home dose is Lantus 40 units QHS.  Would recommend 20 units QHS to begin.  Note: Titrate Lantus until FBS > 140 mg/dL.

## 2012-04-02 ENCOUNTER — Encounter (HOSPITAL_COMMUNITY): Payer: Self-pay | Admitting: Internal Medicine

## 2012-04-02 ENCOUNTER — Encounter (HOSPITAL_COMMUNITY)
Admission: EM | Disposition: A | Discharge status: Home / Self Care | Payer: Self-pay | Source: Home / Self Care | Attending: Internal Medicine

## 2012-04-02 DIAGNOSIS — K2210 Ulcer of esophagus without bleeding: Secondary | ICD-10-CM

## 2012-04-02 DIAGNOSIS — K226 Gastro-esophageal laceration-hemorrhage syndrome: Secondary | ICD-10-CM

## 2012-04-02 HISTORY — DX: Gastro-esophageal laceration-hemorrhage syndrome: K22.6

## 2012-04-02 HISTORY — DX: Ulcer of esophagus without bleeding: K22.10

## 2012-04-02 LAB — HEMOGLOBIN AND HEMATOCRIT, BLOOD
HCT: 28.1 % — ABNORMAL LOW (ref 36.0–46.0)
Hemoglobin: 9.7 g/dL — ABNORMAL LOW (ref 12.0–15.0)

## 2012-04-02 LAB — BASIC METABOLIC PANEL
CO2: 26 mEq/L (ref 19–32)
Chloride: 105 mEq/L (ref 96–112)
Creatinine, Ser: 1.28 mg/dL — ABNORMAL HIGH (ref 0.50–1.10)
GFR calc Af Amer: 42 mL/min — ABNORMAL LOW (ref >90)
Sodium: 137 mEq/L (ref 135–145)

## 2012-04-02 LAB — GLUCOSE, CAPILLARY
Glucose-Capillary: 155 mg/dL — ABNORMAL HIGH (ref 70–99)
Glucose-Capillary: 190 mg/dL — ABNORMAL HIGH (ref 70–99)

## 2012-04-02 SURGERY — EGD (ESOPHAGOGASTRODUODENOSCOPY)
Anesthesia: Moderate Sedation

## 2012-04-02 MED ORDER — SACCHAROMYCES BOULARDII 250 MG PO CAPS: 250.0000 mg | ORAL_CAPSULE | Freq: Two times a day (BID) | ORAL | Status: AC

## 2012-04-02 MED ORDER — PANTOPRAZOLE SODIUM 40 MG PO TBEC: 40.0000 mg | DELAYED_RELEASE_TABLET | Freq: Every day | ORAL | Status: DC

## 2012-04-02 MED ORDER — LOPERAMIDE HCL 2 MG PO CAPS: 2.0000 mg | ORAL_CAPSULE | Freq: Two times a day (BID) | ORAL | Status: AC

## 2012-04-02 NOTE — Progress Notes (Signed)
   CARE MANAGEMENT NOTE 04/02/2012  Patient:  Morgan Morris, Morgan Morris   Account Number:  [id number]  Date Initiated:  04/02/2012  Documentation initiated by:  Raiford Noble  Subjective/Objective Assessment:   pt adm with diarrhea     Action/Plan:   lives at indep living- [facility]   Anticipated DC Date:  04/02/2012   Anticipated DC Plan:  GROUP HOME  In-house referral  Clinical Social Worker      DC Associate Professor  CM consult      [facility] Choice  HOME HEALTH  DURABLE MEDICAL EQUIPMENT   Choice offered to / List presented to:  C-1 Patient   DME arranged  3-N-1      DME agency  [facility]     [facility] arranged  HH-1 RN  HH-2 PT  HH-3 OT  HH-4 NURSE'S AIDE  HH-6 SOCIAL WORKER      HH agency  [facility]   Status of service:  Completed, signed off Medicare Important Message given?   (If response is "NO", the following Medicare IM given date fields will be blank) Date Medicare IM given:   Date Additional Medicare IM given:    Discharge Disposition:  HOME W HOME HEALTH SERVICES  Per UR Regulation:  Reviewed for med. necessity/level of care/duration of stay  If discussed at Long Length of Stay Meetings, dates discussed:    Comments:  04-02-12 Oran Rein [phone] Dcp is to return to [facility] with [facility] services. Spoke with pt's son, Raiford Noble at [phone]. He reports that patient will go to [facility] the 1 st week of May. In the meantime, patient will return to her indep living apt with [facility] services and he plans to arrange [facility] for private duty services. Chose [facility] for PT/OT/RN/SW/hhaide and 3 in 1. Made Debbie with [facility] aware of referral and the likelihood of dc today. No further needs assessed.

## 2012-04-02 NOTE — Progress Notes (Signed)
Occupational Therapy Treatment Patient Details Name: Morgan Morris MRN: [id number] DOB: 04/18/1920 Today's Date: 04/02/2012  OT Assessment/Plan OT Assessment/Plan Comments on Treatment Session: Pt is supposed to discharge to ALF today. Doing much better with all ADL and overall is at supervision min guard assist level. Recommend HHOT at ALF.  OT Plan: Discharge plan remains appropriate OT Frequency: Min 2X/week Follow Up Recommendations: Home health OT Equipment Recommended: Other (comment) (TBA for cane versus walker) None for OT OT Goals ADL Goals ADL Goal: Grooming - Progress: Progressing toward goals Miscellaneous OT Goals OT Goal: Miscellaneous Goal #1 - Progress: Progressing toward goals  OT Treatment Precautions/Restrictions  Precautions Precautions: Fall   ADL ADL Grooming: Performed;Teeth care;Supervision/safety Where Assessed - Grooming: Standing at sink Upper Body Dressing: Performed;Supervision/safety Where Assessed - Upper Body Dressing: Sitting, bed;Unsupported Lower Body Dressing: Performed;Supervision/safety Lower Body Dressing Details (indicate cue type and reason): reached over to [name] to retrieve clothing that were neatly folded on top of BSC. Donned one pair of pants but they were too tight. Doffed and donned another pair. Pt with no LOB trying to stand and fasten pants with zipper and buttons.  Where Assessed - Lower Body Dressing: Sit to stand from bed ADL Comments: pt up with OT to the bathroom to brush her teeth. She declined need to toilet. Pt was min guard for functional mobility in the room with no LOB. She stood at the sink to brush teeth with supervision and donned clothing from EOB/stand with supervision. Did well.  Mobility  Transfers Transfers: Yes Sit to Stand: 5: Supervision;With upper extremity assist;From bed Stand to Sit: 5: Supervision;To bed;With upper extremity assist Exercises    End of Session OT - End of Session Activity Tolerance:  Patient tolerated treatment well Patient left: in bed;with call bell in reach General Behavior During Session: [facility] for tasks performed Cognition: [facility] for tasks performed  Lennox Laity  [phone] 04/02/2012, 2:22 PM

## 2012-04-02 NOTE — Discharge Summary (Signed)
Discharge Summary  Morgan Morris MR#: [id number]  DOB:05/15/1920  Date of Admission: 03/29/2012 Date of Discharge: 04/02/2012  Patient's PCP: Morgan Patch, MD, MD  Attending Physician:Morgan Morris  Consults:   #1 gastroenterology: Dr. Claudette Morris  Discharge Diagnoses: Diarrhea Present on Admission:  .Diarrhea .Nausea & vomiting .Dehydration .Elevated lipase .Nausea .Leukocytosis .Anemia .Hematemesis .Erosive esophagitis .Mallory - Weiss tear   Brief Admitting History and Physical Morgan Morris is a 78 y.o. female  has a past medical history of Hypertension; Diabetes mellitus; and Depression.  Presented with 2 weeks of diarrhea that has been getting worse. She is now having a few BM per hour almost continuas diarrhea. No fever. Started to have abdominal pain yesterday. No vomitting but some nausea She states shehas good appetite.  No chest pain or SOB. No recent exposure to antibiotics. She is unsure if anyone arround her have similar symtoms as [facility]. For the rest of admission history and physical please see H&P dictated by Dr. Adela Morris.   Discharge Medications Medication List  As of 04/02/2012  2:00 PM   START taking these medications         loperamide 2 MG capsule   Commonly known as: IMODIUM   Take 1 capsule (2 mg total) by mouth 2 (two) times daily. USE FOR 2 DAYS, THEN USE AS NEEDED FOR DIARRHEA. iF YOU DEVELOP CONSTIPATION STOP.      pantoprazole 40 MG tablet   Commonly known as: PROTONIX   Take 1 tablet (40 mg total) by mouth daily.      saccharomyces boulardii 250 MG capsule   Commonly known as: FLORASTOR   Take 1 capsule (250 mg total) by mouth 2 (two) times daily.         CONTINUE taking these medications         aspirin EC 325 MG tablet      b complex vitamins tablet      digoxin 0.125 MG tablet   Commonly known as: LANOXIN      escitalopram 10 MG tablet   Commonly known as: LEXAPRO      insulin glargine  100 UNIT/ML injection   Commonly known as: LANTUS      insulin regular 100 units/mL injection   Commonly known as: NOVOLIN R,HUMULIN R      losartan 50 MG tablet   Commonly known as: COZAAR      PRESERVISION AREDS PO      simvastatin 20 MG tablet   Commonly known as: ZOCOR      timolol 0.5 % ophthalmic solution   Commonly known as: BETIMOL      VITAMIN D3 PO      ZOFRAN 4 MG/2ML Soln injection   Generic drug: ondansetron          Where to get your medications    These are the prescriptions that you need to pick up.   You may get these medications from any pharmacy.         loperamide 2 MG capsule   pantoprazole 40 MG tablet   saccharomyces boulardii 250 MG capsule           Hospital Course: Diarrhea Patient was admitted with diarrhea. CT scan which was done was negative. SCDs PCR was also obtained which was negative. Stool studies were also obtained of stool culture which were pending at the time of discharge. Patient improved clinically it was felt patient's diarrhea was secondary to a viral gastroenteritis. Patient was subsequently placed  on scheduled Imodium twice daily as patient was not asking for that as needed Imodium. Patient improved clinically such that by day of discharge she had no further diarrhea. Patient be discharged home on 2 more days of scheduled Imodium and then we'll use Imodium as needed. Patient was also placed on supportive care during the hospitalization and will be discharged in stable and improved condition.  Hematemesis secondary to Mallory-Weiss tear/erosive esophagitis During the hospitalization patient developed an episode of nausea and vomiting with several episodes of emesis which resulted in some hematemesis. A GI consultation was obtained and patient was seen in consultation by Dr. Russella Morris on 03/30/2012. Patient subsequently underwent an upper endoscopy which showed erosive esophagitis and Mallory-Weiss tear. Patient's hemoglobin was followed  and remained stable. Patient did not have any further episodes of hematemesis. Prior to EGD patient was placed on twice daily IV PPI and monitored. Postoperatively it was recommended that patient be placed on long-term PPI. Patient improved clinically did not have any further episodes of hematemesis and will be discharged home in stable and improved condition. Patient will followup with PCP one week post discharge and may followup with gastroenterology on a as needed basis. The rest of patient's chronic medical issues remained stable throughout the hospitalization.  Present on Admission:  .Diarrhea .Nausea & vomiting .Dehydration .Elevated lipase .Nausea .Leukocytosis .Anemia .Hematemesis .Erosive esophagitis .Mallory - Weiss tear   Day of Discharge BP 140/54  Pulse 65  Temp(Src) 98.4 F (36.9 C) (Oral)  Resp 16  Ht 5\' 2"  (1.575 m)  Wt 66.633 kg (146 lb 14.4 oz)  BMI 26.87 kg/m2  SpO2 97% Subjective: Patient denies any diarrhea. No abdominal pain. No nausea, no vomiting. Patient tolerating current diet. General: Alert, awake, oriented x3, in no acute distress. HEENT: No bruits, no goiter. Heart: Regular rate and rhythm, without murmurs, rubs, gallops. Lungs: Clear to auscultation bilaterally. Abdomen: Soft, nontender, nondistended, positive bowel sounds. Extremities: No clubbing cyanosis or edema with positive pedal pulses. Neuro: Grossly intact, nonfocal.   Results for orders placed during the hospital encounter of 03/29/12 (from the past 48 hour(s))  GLUCOSE, CAPILLARY     Status: Abnormal   Collection Time   03/31/12  5:41 PM      Component Value Range Comment   Glucose-Capillary 130 (*) 70 - 99 (mg/dL)   HEMOGLOBIN AND HEMATOCRIT, BLOOD     Status: Abnormal   Collection Time   03/31/12  6:10 PM      Component Value Range Comment   Hemoglobin 10.5 (*) 12.0 - 15.0 (g/dL)    HCT 16.1 (*) 09.6 - 46.0 (%)   GLUCOSE, CAPILLARY     Status: Abnormal   Collection Time   03/31/12   9:52 PM      Component Value Range Comment   Glucose-Capillary 168 (*) 70 - 99 (mg/dL)   BASIC METABOLIC PANEL     Status: Abnormal   Collection Time   04/01/12  5:25 AM      Component Value Range Comment   Sodium 136  135 - 145 (mEq/L)    Potassium 3.4 (*) 3.5 - 5.1 (mEq/L)    Chloride 104  96 - 112 (mEq/L)    CO2 24  19 - 32 (mEq/L)    Glucose, Bld 282 (*) 70 - 99 (mg/dL)    BUN 19  6 - 23 (mg/dL)    Creatinine, Ser 0.45 (*) 0.50 - 1.10 (mg/dL)    Calcium 8.3 (*) 8.4 - 10.5 (mg/dL)  GFR calc non Af Amer 39 (*) >90 (mL/min)    GFR calc Af Amer 45 (*) >90 (mL/min)   HEMOGLOBIN AND HEMATOCRIT, BLOOD     Status: Abnormal   Collection Time   04/01/12  5:25 AM      Component Value Range Comment   Hemoglobin 9.9 (*) 12.0 - 15.0 (g/dL)    HCT 16.1 (*) 09.6 - 46.0 (%)   GLUCOSE, CAPILLARY     Status: Abnormal   Collection Time   04/01/12  7:40 AM      Component Value Range Comment   Glucose-Capillary 322 (*) 70 - 99 (mg/dL)   GLUCOSE, CAPILLARY     Status: Abnormal   Collection Time   04/01/12 11:29 AM      Component Value Range Comment   Glucose-Capillary 201 (*) 70 - 99 (mg/dL)   GLUCOSE, CAPILLARY     Status: Abnormal   Collection Time   04/01/12  5:04 PM      Component Value Range Comment   Glucose-Capillary 139 (*) 70 - 99 (mg/dL)   HEMOGLOBIN AND HEMATOCRIT, BLOOD     Status: Abnormal   Collection Time   04/02/12  3:50 AM      Component Value Range Comment   Hemoglobin 9.7 (*) 12.0 - 15.0 (g/dL)    HCT 04.5 (*) 40.9 - 46.0 (%)   BASIC METABOLIC PANEL     Status: Abnormal   Collection Time   04/02/12  3:50 AM      Component Value Range Comment   Sodium 137  135 - 145 (mEq/L)    Potassium 3.7  3.5 - 5.1 (mEq/L)    Chloride 105  96 - 112 (mEq/L)    CO2 26  19 - 32 (mEq/L)    Glucose, Bld 145 (*) 70 - 99 (mg/dL)    BUN 18  6 - 23 (mg/dL)    Creatinine, Ser 8.11 (*) 0.50 - 1.10 (mg/dL)    Calcium 8.4  8.4 - 10.5 (mg/dL)    GFR calc non Af Amer 36 (*) >90 (mL/min)    GFR  calc Af Amer 42 (*) >90 (mL/min)   GLUCOSE, CAPILLARY     Status: Abnormal   Collection Time   04/02/12  7:41 AM      Component Value Range Comment   Glucose-Capillary 155 (*) 70 - 99 (mg/dL)   GLUCOSE, CAPILLARY     Status: Abnormal   Collection Time   04/02/12 11:53 AM      Component Value Range Comment   Glucose-Capillary 190 (*) 70 - 99 (mg/dL)     Ct Abdomen Pelvis W Contrast  03/29/2012  *RADIOLOGY REPORT*  Clinical Data: Watery diarrhea for 2 days.  Diabetic. Hypertensive.  Prior hysterectomy.  CT ABDOMEN AND PELVIS WITH CONTRAST  Technique:  Multidetector CT imaging of the abdomen and pelvis was performed following the standard protocol during bolus administration of intravenous contrast.  Contrast: OMNIPAQUE IOHEXOL 300 MG/ML  SOLN  Comparison: None.  Findings: Contrast filled top normal sized stomach, small bowel loops and colon without extraluminal bowel inflammatory process, free fluid or free air.  Diverticula most notable sigmoid colon. Appendix not visualized.  Dilated intrahepatic and extrahepatic bile ducts without mass identified. This may be related to patient's age and post cholecystectomy state.  Correlation with laboratory studies recommended.  If there are elevated liver function studies than further investigation may be indicated.  Cardiomegaly.  Atherosclerotic type changes including aortic ectasia and branch vessel ectasia.  Moderate narrowing celiac artery and superior mesenteric artery.  Mild ectasia common iliac arteries.  Calcification right lobe liver without focal liver lesion otherwise noted.  Spleen top normal size without focal mass.  No pancreatic, adrenal or renal mass.  Marked endplate reactive changes L1-2.  This probably represents result of degenerative disease.  Infection is a secondary consideration, not excluded in the proper clinical setting. Scoliosis.  Hip joint degenerative changes.  Subcutaneous inflammation may be related to injection for diabetes.   IMPRESSION: Contrast filled top normal sized stomach, small bowel loops and colon without extraluminal bowel inflammatory process, free fluid or free air.  Diverticula most notable sigmoid colon.  Appendix not visualized.  Dilated intrahepatic and extrahepatic bile ducts without mass identified. This may be related to patient's age and post cholecystectomy state.  Correlation with laboratory studies recommended.  If there are elevated liver function studies than further investigation may be indicated.  Cardiomegaly.  Atherosclerotic type changes as noted above.  Marked endplate reactive changes L1-2.  This probably represents result of degenerative disease.  Infection is a secondary consideration, not excluded in the proper clinical setting.  Original Report Authenticated By: Fuller Canada, M.D.     Disposition: Home/independent living at [facility]  Diet: Carb modified low-residue/mechanical soft  Activity: Increase activity slowly. Home health PT   Follow-up Appts: Discharge Orders    Future Orders Please Complete By Expires   Diet Carb Modified      Comments:   Low residue/ mechanical soft   Increase activity slowly      Discharge instructions      Comments:   Followup with PCP in 1-2 weeks        TESTS THAT NEED FOLLOW-UP   Time spent on discharge, talking to the patient, and coordinating care: 60 mins.   SignedRamiro Harvest 04/02/2012, 2:00 PM

## 2012-04-02 NOTE — Progress Notes (Signed)
CSW consulted with the Raiford Noble; [phone] about the discharge of his mom and services after. THe son noted that mom would receive services from [facility]. His main concern was getting a portable toilet after discharge. The son was linked to nurse case managing for further assistance. CSW is waiting on a response.   Kayleen Memos. Leighton Ruff [phone]

## 2012-04-03 LAB — GLUCOSE, CAPILLARY: Glucose-Capillary: 211 mg/dL — ABNORMAL HIGH (ref 70–99)

## 2013-02-24 ENCOUNTER — Encounter (HOSPITAL_COMMUNITY): Payer: Self-pay | Admitting: *Deleted

## 2013-02-24 ENCOUNTER — Inpatient Hospital Stay (HOSPITAL_COMMUNITY)
Admission: EM | Admit: 2013-02-24 | Discharge: 2013-02-27 | DRG: 308 | Disposition: A | Discharge status: Skilled Nursing Facility | Payer: Medicare Other | Attending: Internal Medicine | Admitting: Internal Medicine

## 2013-02-24 ENCOUNTER — Emergency Department (HOSPITAL_COMMUNITY): Payer: Medicare Other

## 2013-02-24 DIAGNOSIS — I4891 Unspecified atrial fibrillation: Principal | ICD-10-CM

## 2013-02-24 DIAGNOSIS — F3289 Other specified depressive episodes: Secondary | ICD-10-CM | POA: Diagnosis present

## 2013-02-24 DIAGNOSIS — Z79899 Other long term (current) drug therapy: Secondary | ICD-10-CM

## 2013-02-24 DIAGNOSIS — F0390 Unspecified dementia without behavioral disturbance: Secondary | ICD-10-CM | POA: Diagnosis present

## 2013-02-24 DIAGNOSIS — D72829 Elevated white blood cell count, unspecified: Secondary | ICD-10-CM | POA: Diagnosis present

## 2013-02-24 DIAGNOSIS — F329 Major depressive disorder, single episode, unspecified: Secondary | ICD-10-CM | POA: Diagnosis present

## 2013-02-24 DIAGNOSIS — Z794 Long term (current) use of insulin: Secondary | ICD-10-CM

## 2013-02-24 DIAGNOSIS — I129 Hypertensive chronic kidney disease with stage 1 through stage 4 chronic kidney disease, or unspecified chronic kidney disease: Secondary | ICD-10-CM | POA: Diagnosis present

## 2013-02-24 DIAGNOSIS — E119 Type 2 diabetes mellitus without complications: Secondary | ICD-10-CM | POA: Diagnosis present

## 2013-02-24 DIAGNOSIS — I059 Rheumatic mitral valve disease, unspecified: Secondary | ICD-10-CM

## 2013-02-24 DIAGNOSIS — J189 Pneumonia, unspecified organism: Secondary | ICD-10-CM

## 2013-02-24 DIAGNOSIS — N183 Chronic kidney disease, stage 3 unspecified: Secondary | ICD-10-CM | POA: Diagnosis present

## 2013-02-24 DIAGNOSIS — I499 Cardiac arrhythmia, unspecified: Secondary | ICD-10-CM

## 2013-02-24 DIAGNOSIS — Z8719 Personal history of other diseases of the digestive system: Secondary | ICD-10-CM

## 2013-02-24 DIAGNOSIS — Z880 Allergy status to penicillin: Secondary | ICD-10-CM

## 2013-02-24 HISTORY — DX: Cardiac arrhythmia, unspecified: I49.9

## 2013-02-24 HISTORY — DX: Unspecified osteoarthritis, unspecified site: M19.90

## 2013-02-24 LAB — URINALYSIS, ROUTINE W REFLEX MICROSCOPIC
Glucose, UA: 100 mg/dL — AB
Nitrite: NEGATIVE
Protein, ur: NEGATIVE mg/dL
pH: 5.5 (ref 5.0–8.0)

## 2013-02-24 LAB — URINE MICROSCOPIC-ADD ON

## 2013-02-24 LAB — CBC WITH DIFFERENTIAL/PLATELET
Hemoglobin: 10.3 g/dL — ABNORMAL LOW (ref 12.0–15.0)
Lymphocytes Relative: 9 % — ABNORMAL LOW (ref 12–46)
Lymphs Abs: 1 10*3/uL (ref 0.7–4.0)
Monocytes Relative: 10 % (ref 3–12)
Neutro Abs: 8.1 10*3/uL — ABNORMAL HIGH (ref 1.7–7.7)
Neutrophils Relative %: 76 % (ref 43–77)
RBC: 3.19 MIL/uL — ABNORMAL LOW (ref 3.87–5.11)
WBC: 10.8 10*3/uL — ABNORMAL HIGH (ref 4.0–10.5)

## 2013-02-24 LAB — HEPATIC FUNCTION PANEL
ALT: 19 U/L (ref 0–35)
AST: 18 U/L (ref 0–37)
Bilirubin, Direct: 0.2 mg/dL (ref 0.0–0.3)
Indirect Bilirubin: 0.9 mg/dL (ref 0.3–0.9)
Total Bilirubin: 1.1 mg/dL (ref 0.3–1.2)

## 2013-02-24 LAB — BASIC METABOLIC PANEL
BUN: 33 mg/dL — ABNORMAL HIGH (ref 6–23)
Chloride: 100 mEq/L (ref 96–112)
Glucose, Bld: 267 mg/dL — ABNORMAL HIGH (ref 70–99)
Potassium: 4 mEq/L (ref 3.5–5.1)

## 2013-02-24 LAB — TROPONIN I
Troponin I: <0.3 ng/mL (ref <0.30)
Troponin I: <0.3 ng/mL (ref <0.30)
Troponin I: <0.3 ng/mL (ref <0.30)

## 2013-02-24 LAB — GLUCOSE, CAPILLARY
Glucose-Capillary: 228 mg/dL — ABNORMAL HIGH (ref 70–99)
Glucose-Capillary: 186 mg/dL — ABNORMAL HIGH (ref 70–99)
Glucose-Capillary: 184 mg/dL — ABNORMAL HIGH (ref 70–99)

## 2013-02-24 MED ORDER — LEVALBUTEROL HCL 0.63 MG/3ML IN NEBU
0.6300 mg | INHALATION_SOLUTION | Freq: Four times a day (QID) | RESPIRATORY_TRACT | Status: DC | PRN
  Filled 2013-02-24: qty 3

## 2013-02-24 MED ORDER — LEVOFLOXACIN IN D5W 750 MG/150ML IV SOLN
750.0000 mg | INTRAVENOUS | Status: DC
  Administered 2013-02-24: 750 mg via INTRAVENOUS
  Filled 2013-02-24 (×2): qty 150

## 2013-02-24 MED ORDER — SIMVASTATIN 20 MG PO TABS
20.0000 mg | ORAL_TABLET | Freq: Every evening | ORAL | Status: DC
  Administered 2013-02-24 – 2013-02-26 (×3): 20 mg via ORAL
  Filled 2013-02-24 (×4): qty 1

## 2013-02-24 MED ORDER — INSULIN ASPART 100 UNIT/ML ~~LOC~~ SOLN
0.0000 [IU] | Freq: Three times a day (TID) | SUBCUTANEOUS | Status: DC
  Administered 2013-02-24 – 2013-02-25 (×3): 3 [IU] via SUBCUTANEOUS
  Administered 2013-02-26: 2 [IU] via SUBCUTANEOUS
  Administered 2013-02-26: 3 [IU] via SUBCUTANEOUS
  Administered 2013-02-27: 1 [IU] via SUBCUTANEOUS

## 2013-02-24 MED ORDER — ESCITALOPRAM OXALATE 10 MG PO TABS
10.0000 mg | ORAL_TABLET | Freq: Every morning | ORAL | Status: DC
  Administered 2013-02-24 – 2013-02-27 (×4): 10 mg via ORAL
  Filled 2013-02-24 (×4): qty 1

## 2013-02-24 MED ORDER — ASPIRIN EC 325 MG PO TBEC
325.0000 mg | DELAYED_RELEASE_TABLET | Freq: Every day | ORAL | Status: DC
  Administered 2013-02-24 – 2013-02-25 (×2): 325 mg via ORAL
  Filled 2013-02-24 (×2): qty 1

## 2013-02-24 MED ORDER — ONDANSETRON HCL 4 MG PO TABS
4.0000 mg | ORAL_TABLET | Freq: Four times a day (QID) | ORAL | Status: DC | PRN
Start: 2013-02-24 — End: 2013-02-27

## 2013-02-24 MED ORDER — INSULIN GLARGINE 100 UNIT/ML ~~LOC~~ SOLN
40.0000 [IU] | Freq: Every day | SUBCUTANEOUS | Status: DC
  Administered 2013-02-24: 30 [IU] via SUBCUTANEOUS

## 2013-02-24 MED ORDER — VANCOMYCIN HCL IN DEXTROSE 1-5 GM/200ML-% IV SOLN
1000.0000 mg | INTRAVENOUS | Status: DC
  Administered 2013-02-24 – 2013-02-25 (×2): 1000 mg via INTRAVENOUS
  Filled 2013-02-24 (×2): qty 200

## 2013-02-24 MED ORDER — SODIUM CHLORIDE 0.9 % IV SOLN
Freq: Once | INTRAVENOUS | Status: AC
  Administered 2013-02-24: 08:00:00 via INTRAVENOUS

## 2013-02-24 MED ORDER — DIGOXIN 125 MCG PO TABS
125.0000 ug | ORAL_TABLET | Freq: Every day | ORAL | Status: DC
  Administered 2013-02-24 – 2013-02-25 (×2): 125 ug via ORAL
  Filled 2013-02-24 (×2): qty 1

## 2013-02-24 MED ORDER — SODIUM CHLORIDE 0.9 % IJ SOLN
3.0000 mL | Freq: Two times a day (BID) | INTRAMUSCULAR | Status: DC
  Administered 2013-02-24 – 2013-02-26 (×5): 3 mL via INTRAVENOUS

## 2013-02-24 MED ORDER — DIPHENHYDRAMINE HCL 50 MG PO CAPS
50.0000 mg | ORAL_CAPSULE | Freq: Once | ORAL | Status: AC
  Administered 2013-02-24: 50 mg via ORAL
  Filled 2013-02-24: qty 1

## 2013-02-24 MED ORDER — ONDANSETRON HCL 8 MG PO TABS
4.0000 mg | ORAL_TABLET | Freq: Four times a day (QID) | ORAL | Status: DC | PRN
  Filled 2013-02-24: qty 0.5

## 2013-02-24 MED ORDER — TIMOLOL HEMIHYDRATE 0.5 % OP SOLN
1.0000 [drp] | Freq: Two times a day (BID) | OPHTHALMIC | Status: DC
  Administered 2013-02-24 – 2013-02-27 (×6): 1 [drp] via OPHTHALMIC
  Filled 2013-02-24 (×32): qty 0.1

## 2013-02-24 MED ORDER — PANTOPRAZOLE SODIUM 40 MG PO TBEC
40.0000 mg | DELAYED_RELEASE_TABLET | Freq: Every day | ORAL | Status: DC
  Administered 2013-02-24 – 2013-02-27 (×4): 40 mg via ORAL
  Filled 2013-02-24 (×5): qty 1

## 2013-02-24 MED ORDER — ENOXAPARIN SODIUM 40 MG/0.4ML ~~LOC~~ SOLN: 40.0000 mg | SUBCUTANEOUS | Status: DC

## 2013-02-24 MED ORDER — ONDANSETRON HCL 4 MG/2ML IJ SOLN: 4.0000 mg | Freq: Four times a day (QID) | INTRAMUSCULAR | Status: DC | PRN

## 2013-02-24 MED ORDER — ENOXAPARIN SODIUM 60 MG/0.6ML ~~LOC~~ SOLN
60.0000 mg | SUBCUTANEOUS | Status: DC
  Administered 2013-02-24 – 2013-02-25 (×2): 60 mg via SUBCUTANEOUS
  Filled 2013-02-24 (×2): qty 0.6

## 2013-02-24 MED ORDER — ACETAMINOPHEN 325 MG PO TABS: 650.0000 mg | ORAL_TABLET | Freq: Four times a day (QID) | ORAL | Status: DC | PRN

## 2013-02-24 MED ORDER — DIGOXIN 0.25 MG/ML IJ SOLN
0.2500 mg | Freq: Once | INTRAMUSCULAR | Status: AC
  Administered 2013-02-24: 0.25 mg via INTRAVENOUS
  Filled 2013-02-24: qty 2

## 2013-02-24 MED ORDER — ACETAMINOPHEN 650 MG RE SUPP: 650.0000 mg | Freq: Four times a day (QID) | RECTAL | Status: DC | PRN

## 2013-02-24 NOTE — H&P (Signed)
[facility] History and Physical  Morgan Morris ZOX:[id number] DOB: 10/04/1920 DOA: 02/24/2013  Referring physician: ED  PCP: Juline Patch, MD   Chief Complaint: Weakness  HPI:  79 year old female presents to the ER with weakness, she is a resident of assisted-living facility Patient was found to have atrial fibrillation new onset with RVR with a rate in the 110s, she denied any fever chills or rigors. The patient denies any history of falls. She does have a history of diabetes. She had a mildly elevated white count of 10.8. She denied any chest pain any shortness of breath any lightheadedness, just weakness and inability to ambulate      Review of Systems: negative for the following  Constitutional: Denies fever, chills, diaphoresis, appetite change and fatigue.  HEENT: Denies photophobia, eye pain, redness, hearing loss, ear pain, congestion, sore throat, rhinorrhea, sneezing, mouth sores, trouble swallowing, neck pain, neck stiffness and tinnitus.  Respiratory: Denies SOB, DOE, cough, chest tightness, and wheezing.  Cardiovascular: Denies chest pain, palpitations and leg swelling.  Gastrointestinal: Denies nausea, vomiting, abdominal pain, diarrhea, constipation, blood in stool and abdominal distention.  Genitourinary: Denies dysuria, urgency, frequency, hematuria, flank pain and difficulty urinating.  Musculoskeletal: Denies myalgias, back pain, joint swelling, arthralgias and gait problem.  Skin: Denies pallor, rash and wound.  Neurological: Denies dizziness, seizures, syncope, weakness, light-headedness, numbness and headaches.  Hematological: Denies adenopathy. Easy bruising, personal or family bleeding history  Psychiatric/Behavioral: Denies suicidal ideation, mood changes, confusion, nervousness, sleep disturbance and agitation       Past Medical History  Diagnosis Date  . Hypertension   . Diabetes mellitus   . Depression   . Erosive esophagitis 04/02/2012  .  Mallory - Weiss tear 04/02/2012     Past Surgical History  Procedure Laterality Date  . Abdominal hysterectomy    . Appendectomy    . Cholecystectomy    . Esophagogastroduodenoscopy  03/31/2012    Procedure: ESOPHAGOGASTRODUODENOSCOPY (EGD);  Surgeon: Meryl Dare, MD,FACG;  Location: [facility];  Service: Endoscopy;  Laterality: N/A;      Social History:  reports that she has never smoked. She does not have any smokeless tobacco history on file. She reports that she does not drink alcohol or use illicit drugs.    Allergies  Allergen Reactions  . Penicillins   . Sulfa Antibiotics     Family History  Problem Relation Age of Onset  . Cancer Mother      Prior to Admission medications   Medication Sig Start Date End Date Taking? Authorizing Provider  aspirin EC 325 MG tablet Take 325 mg by mouth daily at 12 noon.   Yes Historical Provider, MD  b complex vitamins tablet Take 1 tablet by mouth daily at 12 noon.   Yes Historical Provider, MD  cholecalciferol (VITAMIN D) 1000 UNITS tablet Take 1,000 Units by mouth daily.   Yes Historical Provider, MD  citalopram (CELEXA) 10 MG tablet Take 10 mg by mouth every morning.   Yes Historical Provider, MD  digoxin (LANOXIN) 0.125 MG tablet Take 0.0625 mg by mouth every morning.   Yes Historical Provider, MD  insulin glargine (LANTUS) 100 UNIT/ML injection Inject 45 Units into the skin at bedtime.   Yes Historical Provider, MD  insulin regular (NOVOLIN R,HUMULIN R) 100 units/mL injection Inject 4-25 Units into the skin 3 (three) times daily before meals. Per sliding scale: 0 - 120 = 4 units 121 - 150 = 5 units 151 - 200 = 10 units 201 - 250 =  15 units 251 - 300 = 20 units >300 = 25 units   Yes Historical Provider, MD  latanoprost (XALATAN) 0.005 % ophthalmic solution Place 1 drop into both eyes at bedtime.   Yes Historical Provider, MD  losartan (COZAAR) 50 MG tablet Take 50 mg by mouth every morning.   Yes Historical Provider, MD   Multiple Vitamins-Minerals (OCUVITE PRESERVISION PO) Take 1 tablet by mouth 2 (two) times daily.   Yes Historical Provider, MD  timolol (BETIMOL) 0.5 % ophthalmic solution Place 1 drop into both eyes 2 (two) times daily.   Yes Historical Provider, MD     Physical Exam: Filed Vitals:   02/24/13 0915 02/24/13 0930 02/24/13 0945 02/24/13 1000  BP: 116/64 115/66 134/77 121/79  Pulse: 97 125 71 78  Temp:      TempSrc:      Resp: 21 15 22 12   Height:      Weight:      SpO2: 96% 97% 96% 95%     Constitutional: Vital signs reviewed. Patient is a well-developed and well-nourished in no acute distress and cooperative with exam. Alert and oriented x3.  Head: Normocephalic and atraumatic  Ear: TM normal bilaterally  Mouth: no erythema or exudates, MMM  Eyes: PERRL, EOMI, conjunctivae normal, No scleral icterus.  Neck: Supple, Trachea midline normal ROM, No JVD, mass, thyromegaly, or carotid bruit present.  Cardiovascular: RRR, S1 normal, S2 normal, no MRG, pulses symmetric and intact bilaterally  Pulmonary/Chest: CTAB, no wheezes, rales, or rhonchi  Abdominal: Soft. Non-tender, non-distended, bowel sounds are normal, no masses, organomegaly, or guarding present.  GU: no CVA tenderness Musculoskeletal: No joint deformities, erythema, or stiffness, ROM full and no nontender Ext: no edema and no cyanosis, pulses palpable bilaterally (DP and PT)  Hematology: no cervical, inginal, or axillary adenopathy.  Neurological: A&O x3, Strenght is normal and symmetric bilaterally, cranial nerve II-XII are grossly intact, no focal motor deficit, sensory intact to light touch bilaterally.  Skin: Warm, dry and intact. No rash, cyanosis, or clubbing.  Psychiatric: Normal mood and affect. speech and behavior is normal. Judgment and thought content normal. Cognition and memory are normal.       Labs on Admission:    Basic Metabolic Panel:  Recent Labs Lab 02/24/13 0800  NA 137  K 4.0  CL 100  CO2  27  GLUCOSE 267*  BUN 33*  CREATININE 1.36*  CALCIUM 9.2  MG 2.5  PHOS 3.4   Liver Function Tests: No results found for this basename: AST, ALT, ALKPHOS, BILITOT, PROT, ALBUMIN,  in the last 168 hours No results found for this basename: LIPASE, AMYLASE,  in the last 168 hours No results found for this basename: AMMONIA,  in the last 168 hours CBC:  Recent Labs Lab 02/24/13 0800  WBC 10.8*  NEUTROABS 8.1*  HGB 10.3*  HCT 28.5*  MCV 89.3  PLT 156   Cardiac Enzymes:  Recent Labs Lab 02/24/13 0800  TROPONINI <0.30    BNP (last 3 results) No results found for this basename: PROBNP,  in the last 8760 hours    CBG: No results found for this basename: GLUCAP,  in the last 168 hours  Radiological Exams on Admission: Dg Chest Port 1 View  02/24/2013  *RADIOLOGY REPORT*  Clinical Data: Atrial fibrillation.  Cough.  Diabetic.  PORTABLE CHEST - 1 VIEW  Comparison: 08/09/2004  Findings: Numerous leads and wires project over the chest. Moderate cardiomegaly.  Atherosclerosis in the transverse aorta. No pleural effusion or  pneumothorax.  Mild pulmonary interstitial prominence.  Lower lobe predominant.  Patchy bibasilar airspace disease, greater right than left.  IMPRESSION: Bibasilar airspace disease.  Especially on the right, infection or aspiration is suspected.  Underlying pulmonary interstitial prominence could represent pulmonary venous congestion.  No overt congestive failure.   Original Report Authenticated By: Jeronimo Greaves, M.D.     EKG: IDate: 02/24/2013  Rate: 102  Rhythm: atrial fibrillation  QRS Axis: normal  Intervals: normal  ST/T Wave abnormalities: nonspecific ST/T changes  Conduction Disutrbances:none  Narrative Interpretation:  Old EKG Reviewed: changes noted new afib, t wave inversions lateral leads   Assessment/Plan Principal Problem:   HCAP (healthcare-associated pneumonia) Active Problems:   Leukocytosis   Atrial fibrillation   1. Weakness secondary  to new-onset atrial fibrillation, [place] score of 2, and need PT evaluation is not a fall risk and the patient can be started on Zarontin, we'll check a TSH a 2-D echo, rate is controlled, continue digoxin, digoxin level subtherapeutic, 2. Healthcare associated pneumonia, chest x-ray shows by basilar airspace disease although the patient does not have any history of fever or cough. She does have a mildly elevated white count we'll start her on vancomycin levofloxacin, DC vancomycin blood culture negative and switch to oral levofloxacin 3. PT OT eval  Code Status:   full Family Communication: bedside Disposition Plan: admit   Time spent: 70 mins   [facility] Pager [phone]  If 7PM-7AM, please contact night-coverage [email or web address] Password [facility] 02/24/2013, 10:57 AM

## 2013-02-24 NOTE — ED Notes (Signed)
Pt here from assisted living facility [facility] with complaint of weakness.  [facility] arrival pt. With AFIB on monitor, probable new onset.  IV established, CBG performed.

## 2013-02-24 NOTE — ED Provider Notes (Signed)
History     CSN: [id number]  Arrival date & time 02/24/13  0728   First MD Initiated Contact with Patient 02/24/13 0732      Chief Complaint  Patient presents with  . Atrial Fibrillation    (Consider location/radiation/quality/duration/timing/severity/associated sxs/prior treatment) HPI Comments: Pt comes in with cc of weakness. Pt has hx of DM2, HTN and noted to be in afib. Pt states that for the past couple of days she has been feeling week - having difficulty even getting out of the bed. Pt denies nausea, emesis, fevers, chills, chest pains, shortness of breath, headaches, abdominal pain, uti like symptoms. Pt noted to be in afib. No hx of afib. Pt denies any palpitations, dizziness, near syncope.  Patient is a 79 y.o. female presenting with atrial fibrillation. The history is provided by the patient.  Atrial Fibrillation Pertinent negatives include no chest pain, no abdominal pain, no headaches and no shortness of breath.    Past Medical History  Diagnosis Date  . Hypertension   . Diabetes mellitus   . Depression   . Erosive esophagitis 04/02/2012  . Mallory - Weiss tear 04/02/2012    Past Surgical History  Procedure Laterality Date  . Abdominal hysterectomy    . Appendectomy    . Cholecystectomy    . Esophagogastroduodenoscopy  03/31/2012    Procedure: ESOPHAGOGASTRODUODENOSCOPY (EGD);  Surgeon: Meryl Dare, MD,FACG;  Location: [facility];  Service: Endoscopy;  Laterality: N/A;    Family History  Problem Relation Age of Onset  . Cancer Mother     History  Substance Use Topics  . Smoking status: Never Smoker   . Smokeless tobacco: Not on file  . Alcohol Use: No    OB History   Grav Para Term Preterm Abortions TAB SAB Ect Mult Living                  Review of Systems  Constitutional: Positive for activity change and fatigue. Negative for fever and chills.  HENT: Negative for facial swelling and neck pain.   Respiratory: Negative for cough, shortness  of breath and wheezing.   Cardiovascular: Negative for chest pain.  Gastrointestinal: Negative for nausea, vomiting, abdominal pain, diarrhea, constipation, blood in stool and abdominal distention.  Genitourinary: Negative for dysuria, hematuria and difficulty urinating.  Skin: Negative for color change.  Neurological: Negative for speech difficulty and headaches.  Hematological: Does not bruise/bleed easily.  Psychiatric/Behavioral: Negative for confusion.    Allergies  Penicillins and Sulfa antibiotics  Home Medications   Current Outpatient Rx  Name  Route  Sig  Dispense  Refill  . aspirin EC 325 MG tablet   Oral   Take 325 mg by mouth daily at 12 noon.         Marland Kitchen b complex vitamins tablet   Oral   Take 1 tablet by mouth daily at 12 noon.         . Cholecalciferol (VITAMIN D3 PO)   Oral   Take 1 tablet by mouth daily at 12 noon.         . digoxin (LANOXIN) 0.125 MG tablet   Oral   Take 125 mcg by mouth daily at 12 noon.         . escitalopram (LEXAPRO) 10 MG tablet   Oral   Take 10 mg by mouth every morning.         . insulin glargine (LANTUS) 100 UNIT/ML injection   Subcutaneous   Inject 40 Units into  the skin at bedtime.         . insulin regular (NOVOLIN R,HUMULIN R) 100 units/mL injection   Subcutaneous   Inject into the skin 3 (three) times daily before meals. She uses a sliding scale.         . losartan (COZAAR) 50 MG tablet   Oral   Take 50 mg by mouth every morning.         . Multiple Vitamins-Minerals (PRESERVISION AREDS PO)   Oral   Take 1 tablet by mouth daily at 12 noon.         . ondansetron (ZOFRAN) 4 MG/2ML SOLN injection   Intravenous   Inject 4 mg into the vein once.         . pantoprazole (PROTONIX) 40 MG tablet   Oral   Take 1 tablet (40 mg total) by mouth daily.   31 tablet   0   . simvastatin (ZOCOR) 20 MG tablet   Oral   Take 20 mg by mouth every evening.         . timolol (BETIMOL) 0.5 % ophthalmic  solution   Both Eyes   Place 1 drop into both eyes 2 (two) times daily.           BP 150/79  Temp(Src) 98.3 F (36.8 C) (Oral)  Ht 5\' 2"  (1.575 m)  Wt 138 lb 14.2 oz (62.999 kg)  BMI 25.4 kg/m2  SpO2 96%  Physical Exam  Nursing note and vitals reviewed. Constitutional: She is oriented to person, place, and time. She appears well-developed and well-nourished.  HENT:  Head: Normocephalic and atraumatic.  Eyes: EOM are normal. Pupils are equal, round, and reactive to light.  Neck: Neck supple.  Cardiovascular: Normal rate.   Murmur heard. Pulmonary/Chest: Effort normal. No respiratory distress.  Abdominal: Soft. She exhibits no distension. There is no tenderness. There is no rebound and no guarding.  Neurological: She is alert and oriented to person, place, and time.  Skin: Skin is warm and dry.    ED Course  Procedures (including critical care time)  Labs Reviewed  CBC WITH DIFFERENTIAL  BASIC METABOLIC PANEL  MAGNESIUM  PHOSPHORUS  URINALYSIS, ROUTINE W REFLEX MICROSCOPIC  TROPONIN I   No results found.   No diagnosis found.    MDM   Date: 02/24/2013  Rate: 102  Rhythm: atrial fibrillation  QRS Axis: normal  Intervals: normal  ST/T Wave abnormalities: nonspecific ST/T changes  Conduction Disutrbances:none  Narrative Interpretation:   Old EKG Reviewed: changes noted new afib, t wave inversions lateral leads  Pt comes in with cc of weakness. DDx: Sepsis syndrome ACS syndrome DKA ICH Stroke CHF exacerbation COPD exacerbation Infection - pneumonia/UTI/Cellulitis PE Dehydration Electrolyte abnormality Tox syndrome  Pt also has new onset afib. Will get basic screening. CHADs2 score is 2 for age and DM, will anticoagulate.      Derwood Kaplan, MD 02/24/13 [phone]

## 2013-02-24 NOTE — Evaluation (Signed)
Clinical/Bedside Swallow Evaluation Patient Details  Name: Morgan Morris MRN: [id number] Date of Birth: Jan 25, 1920  Today's Date: 02/24/2013 Time: 1200-1240 SLP Time Calculation (min): 40 min  Past Medical History:  Past Medical History  Diagnosis Date  . Hypertension   . Diabetes mellitus   . Depression   . Erosive esophagitis 04/02/2012  . Mallory - Weiss tear 04/02/2012  . Dysrhythmia 02/24/2013    ATRIAL FIB WITH RVR  . Shortness of breath     " JUST RECENTLY "  . Arthritis    Past Surgical History:  Past Surgical History  Procedure Laterality Date  . Abdominal hysterectomy    . Appendectomy    . Cholecystectomy    . Esophagogastroduodenoscopy  03/31/2012    Procedure: ESOPHAGOGASTRODUODENOSCOPY (EGD);  Surgeon: Meryl Dare, MD,FACG;  Location: [facility];  Service: Endoscopy;  Laterality: N/A;   HPI:  79 year old female presents to the ER with weakness, she is a resident of assisted-living facility   Assessment / Plan / Recommendation Clinical Impression  Patient appears to have a normal swallow, with no delay to initiate the swallow, good laryngeal elevation palpated, no cough or wet voice after the swallow, and no difficulty chewing solids noted.    Aspiration Risk  Mild    Diet Recommendation Regular;Thin liquid   Liquid Administration via: Cup;Straw Medication Administration: Whole meds with liquid Compensations: Slow rate;Small sips/bites Postural Changes and/or Swallow Maneuvers: Seated upright 90 degrees    Other  Recommendations Oral Care Recommendations: Oral care BID Other Recommendations: Clarify dietary restrictions (Carb. modified)   Follow Up Recommendations  None    Frequency and Duration        Pertinent Vitals/Pain n/a    SLP Swallow Goals     Swallow Study Prior Functional Status       General HPI: 79 year old female presents to the ER with weakness, she is a resident of assisted-living facility Type of Study: Bedside swallow  evaluation Previous Swallow Assessment: N/A Diet Prior to this Study: Regular;Thin liquids Temperature Spikes Noted: No Respiratory Status: Room air History of Recent Intubation: No Behavior/Cognition: Alert;Pleasant mood;Cooperative;Hard of hearing Oral Cavity - Dentition: Adequate natural dentition (One broken tooth (no pain or chewing prob.)) Self-Feeding Abilities: Able to feed self;Needs set up Patient Positioning: Upright in bed Baseline Vocal Quality: Clear Volitional Cough: Strong Volitional Swallow: Able to elicit    Oral/Motor/Sensory Function Overall Oral Motor/Sensory Function: Appears within functional limits for tasks assessed   Ice Chips Ice chips: Within functional limits Presentation: Spoon   Thin Liquid Thin Liquid: Within functional limits Presentation: Cup;Straw    Nectar Thick Nectar Thick Liquid: Not tested   Honey Thick Honey Thick Liquid: Not tested   Puree Puree: Within functional limits Presentation: Self Fed   Solid   GO    Solid: Within functional limits Presentation: Self Daine Gravel, Lori T 02/24/2013,1:24 PM

## 2013-02-24 NOTE — Progress Notes (Signed)
ANTIBIOTIC CONSULT NOTE - INITIAL  Pharmacy Consult for vancomycin, levaquin and LMWH Indication: rule out pneumonia and atrial fibrillation  Allergies  Allergen Reactions  . Penicillins   . Sulfa Antibiotics     Patient Measurements: Height: 5\' 2"  (157.5 cm) Weight: 138 lb 14.2 oz (62.999 kg) IBW/kg (Calculated) : 50.1 Adjusted Body Weight: 55.3 kg  Vital Signs: Temp: 98.3 F (36.8 C) (03/05 0733) Temp src: Oral (03/05 0733) BP: 150/79 mmHg (03/05 0733) Intake/Output from previous day:   Intake/Output from this shift:    Labs:  Recent Labs  02/24/13 0800  WBC 10.8*  HGB 10.3*  PLT 156  CREATININE 1.36*   Estimated Creatinine Clearance: 24 ml/min (by C-G formula based on Cr of 1.36). No results found for this basename: VANCOTROUGH, VANCOPEAK, VANCORANDOM, GENTTROUGH, GENTPEAK, GENTRANDOM, TOBRATROUGH, TOBRAPEAK, TOBRARND, AMIKACINPEAK, AMIKACINTROU, AMIKACIN,  in the last 72 hours   Microbiology: No results found for this or any previous visit (from the past 720 hour(s)).  Medical History: Past Medical History  Diagnosis Date  . Hypertension   . Diabetes mellitus   . Depression   . Erosive esophagitis 04/02/2012  . Mallory - Weiss tear 04/02/2012    Medications: see med hx, no abx, no anticoagulants  Assessment:  Mrs. Piedra is a 79 yo WF from assisted living facility to start LMWH per pharmacy for new onset atrial fibrillation and vancomycin and levaquin per pharmacy for r/o PNA.  Her wt is 63 kg.  Her creat is 1.36 and her creat cl is 24 ml/min.  Her WBC is 10.8 and her H/H is10.3/28.5, her PLTC on the low side at 156 K.  She is afebrile. Her CXR shows bibasilar airspace disease, esp on the R.  Goal of Therapy:  Vancomycin trough level 15-20 mcg/ml 4 hour heparin level 0.6 - 1.2 units/ml  Plan:  1. LMWH 60 mg/kg q24 2. Vancomycin 1000 mg IV q24 3. levaquin 750 mg IV q48 4. F/u renal function closely, if creat cl > 30 can change LMWH to q12h 5. F/u  culture data, check SS vancomycin trough as needed. Herby Abraham, Pharm.D. [phone] 02/24/2013 9:23 AM

## 2013-02-25 ENCOUNTER — Encounter (HOSPITAL_COMMUNITY): Payer: Self-pay | Admitting: Physician Assistant

## 2013-02-25 DIAGNOSIS — I10 Essential (primary) hypertension: Secondary | ICD-10-CM

## 2013-02-25 LAB — CBC
HCT: 29.8 % — ABNORMAL LOW (ref 36.0–46.0)
Hemoglobin: 10.9 g/dL — ABNORMAL LOW (ref 12.0–15.0)
MCV: 89.5 fL (ref 78.0–100.0)
WBC: 9.5 10*3/uL (ref 4.0–10.5)

## 2013-02-25 LAB — GLUCOSE, CAPILLARY
Glucose-Capillary: 68 mg/dL — ABNORMAL LOW (ref 70–99)
Glucose-Capillary: 284 mg/dL — ABNORMAL HIGH (ref 70–99)

## 2013-02-25 LAB — BASIC METABOLIC PANEL
CO2: 23 mEq/L (ref 19–32)
Chloride: 104 mEq/L (ref 96–112)
Creatinine, Ser: 1.33 mg/dL — ABNORMAL HIGH (ref 0.50–1.10)
GFR calc Af Amer: 39 mL/min — ABNORMAL LOW (ref >90)
Potassium: 4 mEq/L (ref 3.5–5.1)

## 2013-02-25 MED ORDER — METOPROLOL TARTRATE 25 MG PO TABS
25.0000 mg | ORAL_TABLET | Freq: Two times a day (BID) | ORAL | Status: DC
  Administered 2013-02-25 – 2013-02-27 (×4): 25 mg via ORAL
  Filled 2013-02-25 (×5): qty 1

## 2013-02-25 MED ORDER — INSULIN GLARGINE 100 UNIT/ML ~~LOC~~ SOLN
35.0000 [IU] | Freq: Every day | SUBCUTANEOUS | Status: DC
  Administered 2013-02-25: 35 [IU] via SUBCUTANEOUS

## 2013-02-25 MED ORDER — APIXABAN 2.5 MG PO TABS
2.5000 mg | ORAL_TABLET | Freq: Two times a day (BID) | ORAL | Status: DC
  Administered 2013-02-26 – 2013-02-27 (×3): 2.5 mg via ORAL
  Filled 2013-02-25 (×4): qty 1

## 2013-02-25 NOTE — Progress Notes (Signed)
Pt was only given 30 units of night time dose of lantus insulin, she refused to have 40 units. Pt stated she's been adjusting her lantus and it was decreased to 30 units d/t cbgs dropping during the middle of the night. Will continue to monitor the pt. Sanda Linger

## 2013-02-25 NOTE — Progress Notes (Signed)
Utilization review completed. Bertha Stanfill, RN, BSN. 

## 2013-02-25 NOTE — Evaluation (Signed)
Physical Therapy Evaluation Patient Details Name: Morgan Morris MRN: [id number] DOB: 07/29/1920 Today's Date: 02/25/2013 Time: 4098-1191 PT Time Calculation (min): 22 min  PT Assessment / Plan / Recommendation Clinical Impression   yo female admitted with AFib RVR 110 and confusion. Pt currenlty with delusions and hearing music.  PT to follow acutely. Recommend SNF for d/c planning.    PT Assessment  Patient needs continued PT services    Follow Up Recommendations  SNF    Does the patient have the potential to tolerate intense rehabilitation      Barriers to Discharge Decreased caregiver support      Equipment Recommendations  Other (comment) (TBD post acute)    Recommendations for Other Services     Frequency Min 3X/week    Precautions / Restrictions Precautions Precautions: Fall   Pertinent Vitals/Pain       Mobility  Bed Mobility Bed Mobility: Supine to Sit;Sitting - Scoot to Edge of Bed;Sit to Supine Supine to Sit: 6: Modified independent (Device/Increase time);HOB flat Sitting - Scoot to Edge of Bed: 6: Modified independent (Device/Increase time) Sit to Supine: 6: Modified independent (Device/Increase time);HOB flat Details for Bed Mobility Assistance: safe mobility Transfers Transfers: Sit to Stand;Stand to Sit Sit to Stand: 4: Min guard;With upper extremity assist;From bed Stand to Sit: 4: Min guard;With upper extremity assist;To bed Details for Transfer Assistance: vc's for hand placement, close S for safety Ambulation/Gait Ambulation/Gait Assistance: 4: Min guard Ambulation Distance (Feet): 70 Feet Assistive device: Rolling walker Ambulation/Gait Assistance Details: generally steady, but uses RW as an after thought and leaves it behind when she changes tasks Gait Pattern: Step-through pattern;Decreased step length - right;Decreased step length - left;Decreased stride length Stairs: No    Exercises     PT Diagnosis: Generalized weakness;Other  (comment) (decr activity tolerance)  PT Problem List: Decreased strength;Decreased activity tolerance;Decreased balance;Decreased knowledge of use of DME PT Treatment Interventions: Gait training;DME instruction;Functional mobility training;Therapeutic activities;Balance training;Patient/family education   PT Goals Acute Rehab PT Goals PT Goal Formulation: With patient Time For Goal Achievement: 03/11/13 Potential to Achieve Goals: Good Pt will go Supine/Side to Sit: with modified independence;with HOB 0 degrees PT Goal: Supine/Side to Sit - Progress: Goal set today Pt will go Sit to Stand: with supervision;with upper extremity assist PT Goal: Sit to Stand - Progress: Goal set today Pt will Transfer Bed to Chair/Chair to Bed: with supervision PT Transfer Goal: Bed to Chair/Chair to Bed - Progress: Goal set today Pt will Ambulate: >150 feet;with supervision;with least restrictive assistive device PT Goal: Ambulate - Progress: Goal set today  Visit Information  Last PT Received On: 02/25/13 Assistance Needed: +1    Subjective Data  Subjective: I'm not worried about that  (mobility/deconditioning), I'm worried about my head  (delusions hallucinations) Patient Stated Goal: Figure out what's going on with my head.   Prior Functioning  Home Living Lives With: Other (Comment) (independent living at [facility] per pt.) Additional Comments: Pt is reporting events that are delusions and hearing music that is not present. Pt reports knowing that this is not real and that she is quote out of my mind. 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    Cognition  Cognition Overall Cognitive Status: Impaired Arousal/Alertness: Awake/alert Orientation Level: Oriented X4 / Intact Behavior During Session: WFL for tasks performed    Extremity/Trunk Assessment Right Upper Extremity Assessment RUE  ROM/Strength/Tone: Within functional levels RUE Sensation: WFL - Light Touch  RUE Coordination: WFL - gross/fine motor Left Upper Extremity Assessment LUE ROM/Strength/Tone: Within functional levels LUE Sensation: WFL - Light Touch LUE Coordination: WFL - gross/fine motor Right Lower Extremity Assessment RLE ROM/Strength/Tone: WFL for tasks assessed Left Lower Extremity Assessment LLE ROM/Strength/Tone: WFL for tasks assessed Trunk Assessment Trunk Assessment: Normal   Balance Balance Balance Assessed: Yes Dynamic Standing Balance Dynamic Standing - Balance Support: During functional activity;Left upper extremity supported;Right upper extremity supported (brushing teeth) Dynamic Standing - Level of Assistance: 5: Stand by assistance Dynamic Standing - Balance Activities: Lateral lean/weight shifting;Forward lean/weight shifting;Reaching for objects Dynamic Standing - Comments: no hesitation in completing tooth brushing task  End of Session PT - End of Session Activity Tolerance: Patient tolerated treatment well Patient left: in chair;with call bell/phone within reach Nurse Communication: Mobility status  GP     Mottinger, Eliseo Gum 02/25/2013, 5:01 PM 02/25/2013  [place] Bing, PT [phone] (pager)

## 2013-02-25 NOTE — Evaluation (Signed)
Occupational Therapy Evaluation Patient Details Name: Morgan Morris MRN: [id number] DOB: 04-Oct-1920 Today's Date: 02/25/2013 Time: 4098-1191 OT Time Calculation (min): 23 min  OT Assessment / Plan / Recommendation Clinical Impression  79 yo female admitted with AFib RVR 110 and confusion. Pt currenlty with delusions and hearing music. Ot to follow acutely. Recommend SNF for d/c planning    OT Assessment  Patient needs continued OT Services    Follow Up Recommendations  SNF    Barriers to Discharge      Equipment Recommendations  None recommended by OT    Recommendations for Other Services    Frequency  Min 2X/week    Precautions / Restrictions Precautions Precautions: Fall   Pertinent Vitals/Pain     ADL  Grooming: Teeth care;Supervision/safety Where Assessed - Grooming: Unsupported standing (abandoned Rw at sink ) Toilet Transfer: Hydrographic surveyor Method: Sit to Barista: Regular height toilet Equipment Used: Gait belt;Rolling walker Transfers/Ambulation Related to ADLs: Pt pulling up on RW and reports not using RW prior to admission due to losing it. Pt states "I would put it over there forget about it and have to walk all teh way back to get it" Pt demonstrates ability to ambulate with guarded gait from sink level to bed (unsafe use of RW ) ADL Comments: Pt reports event as "I was up here above my bed like on the ceiliing but when I realized it I was not I was right here in the bed"- Pt explains the event of delusion as floating above the bed. Pt says "do you hear that music OH i just wish it would stop" Pt sining out a opera/ symphony sound. Pt states it started after husband died and hasn't stopped. pt also reports its not the same music he played (he played big band music). Pt internally distracted entire session by delusions and the experience    OT Diagnosis: Generalized weakness  OT Problem List: Decreased strength;Decreased activity  tolerance;Impaired balance (sitting and/or standing);Decreased cognition;Decreased safety awareness;Decreased knowledge of use of DME or AE;Decreased knowledge of precautions OT Treatment Interventions: Self-care/ADL training;DME and/or AE instruction;Cognitive remediation/compensation;Therapeutic activities;Patient/family education;Balance training   OT Goals Acute Rehab OT Goals OT Goal Formulation: With patient Time For Goal Achievement: 03/11/13 Potential to Achieve Goals: Good ADL Goals Pt Will Perform Upper Body Bathing: with modified independence;Sit to stand from chair ADL Goal: Upper Body Bathing - Progress: Goal set today Pt Will Perform Lower Body Bathing: with modified independence;Sit to stand from chair ADL Goal: Lower Body Bathing - Progress: Goal set today Pt Will Perform Upper Body Dressing: with modified independence;Sit to stand from chair ADL Goal: Upper Body Dressing - Progress: Goal set today Pt Will Perform Lower Body Dressing: with modified independence;Sit to stand from chair ADL Goal: Lower Body Dressing - Progress: Goal set today  Visit Information  Last OT Received On: 02/25/13 Assistance Needed: +1 PT/OT Co-Evaluation/Treatment: Yes    Subjective Data  Subjective: "I told them not to bring me up here I am 79 years old and to just let me die. Now look"- Pt very upset that she did not expire and doesn't feel that she should have medical treatment Patient Stated Goal: to get rid of the music she hears that isnt' present   Prior Functioning     Home Living Lives With: Other (Comment) (independent living at [facility] per patient) Additional Comments: Pt is reporting events that are delusions and hearing music that is not present. Pt  reports knowing that this is not real and that she is quote out of my mind. 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          Vision/Perception Vision - History Baseline Vision: No visual deficits Patient Visual Report: No change from baseline   Cognition  Cognition Overall Cognitive Status: Impaired Arousal/Alertness: Awake/alert Orientation Level: Oriented X4 / Intact Behavior During Session: WFL for tasks performed    Extremity/Trunk Assessment Right Upper Extremity Assessment RUE ROM/Strength/Tone: Within functional levels RUE Sensation: WFL - Light Touch RUE Coordination: WFL - gross/fine motor Left Upper Extremity Assessment LUE ROM/Strength/Tone: Within functional levels LUE Sensation: WFL - Light Touch LUE Coordination: WFL - gross/fine motor Trunk Assessment Trunk Assessment: Normal     Mobility Bed Mobility Bed Mobility: Supine to Sit;Sitting - Scoot to Edge of Bed;Sit to Supine Supine to Sit: 6: Modified independent (Device/Increase time);HOB flat Sitting - Scoot to Edge of Bed: 6: Modified independent (Device/Increase time) Sit to Supine: 6: Modified independent (Device/Increase time);HOB flat Transfers Transfers: Sit to Stand;Stand to Sit Sit to Stand: 4: Min guard;With upper extremity assist;From bed Stand to Sit: 4: Min guard;With upper extremity assist;To bed     Exercise     Balance     End of Session OT - End of Session Activity Tolerance: Patient tolerated treatment well Patient left: in bed;with call bell/phone within reach Nurse Communication: Mobility status;Precautions  GO     Lucile Shutters 02/25/2013, 4:39 PM  Pager: [phone]

## 2013-02-25 NOTE — Consult Note (Signed)
CARDIOLOGY CONSULT NOTE  Patient ID: OMA MARZAN, MRN: [id number], DOB/AGE: Dec 25, 1919 79 y.o. Admit date: 02/24/2013   Date of Consult: 02/25/2013 Primary Physician: Juline Patch, MD Primary Cardiologist: New to [facility]  Chief Complaint: weakness Reason for Consult: afib RVR  HPI:  Ms. Grunden is a 79 y/o F with history of HTN, DM, Mallory Weiss tear with erosive gastritis 03/2012 who presented to [facility] with weakness and was found to have afib RVR rates 110's and possible HCAP by CXR. Per note from 2006, she had some kind of arrhythmia many years ago and has been on digoxin for at least several years but details are not clear. Last EKG in Epic 03/2012 was NSR. She states she "self diagnosed herself" with a stroke about a year ago when she had an instance where she stood up and suddenly had trouble thinking. She has had some memory trouble. She also reports hearing music when no music is playing on occasion. She lives at [facility]. She is A+O to self and date, but she was truly surprised to hear she was at [facility] instead of there during our interview.  For the past several days, she's had weakness, dizziness, and overall fatigue. She is sleeping more than usual. She had to stop and rest on the way to the laundry room the other day due to feeling "just exhausted." Denies CP, SOB, syncope, LEE, orthopnea. She did not have any palpitations with this episode, but has had several episodes over the last 8 months "like my heart just starts racing without any reason." These can last for quite a while but then resolve spontaneously. On arrival to the ED, HR was 110 and BP 120/61, POx WNL. Labs significant for troponin neg x 4, WBC 10.8, BUN/Cr 26/1.33, dig level 0.4, UA with small leuk, normal TSH, Hgb 10.9. CXR showing bibasilar airspace disease especially on the R, infection versus aspiration suspected, with underlying pulmonary interstitial prominence that could represent pulmonary venous  congestion. She was started on abx, reloaded with 0.25mg  IV digoxin and continued on her usual scheduled digoxin dose of 0.165mcg. HR presently in the 80's. No BRBPR, melena, hematuria, or hematemesis. Slight cough 2 months ago but none recently.   She says she was told by her doctor that she should use a walker, but is reluctant to do so. PT eval pending. She reports 1 fall about a year ago when she was rushing in a parking lot and her toe caught the pavement and she tripped.  Past Medical History  Diagnosis Date  . Hypertension   . Diabetes mellitus   . Depression   . Erosive esophagitis 04/02/2012  . Mallory - Weiss tear 04/02/2012  . Dysrhythmia 02/24/2013    ATRIAL FIB WITH RVR  . Arthritis       Most Recent Cardiac Studies: 2D Echo 02/24/13 Study Conclusions - Left ventricle: The cavity size was normal. Wall thickness was increased in a pattern of mild LVH. Systolic function was normal. The estimated ejection fraction was in the range of 60% to 65%. Wall motion was normal; there were no regional wall motion abnormalities. Indeterminant diastolic function (atrial fibrillation). - Aortic valve: There was no stenosis. - Mitral valve: Mildly calcified annulus. Mild to moderate regurgitation. - Left atrium: The atrium was moderately dilated. 45mm - Right ventricle: The cavity size was normal. Systolic function was normal. - Right atrium: The atrium was mildly dilated. - Tricuspid valve: Moderate regurgitation. Peak RV-RA gradient: 45mm Hg (S). -  Pulmonary arteries: PA peak pressure: 60mm Hg (S). - Systemic veins: IVC measured 2.5 cm with minimal respirophasic variation, suggesting RA pressure 15 mmHg. Impressions: - The patient was in atrial fibrillation. Normal LV size and systolic function, EF 60-65%. Mild LV hypertrophy. Normal RV size and systolic function. Biatrial enlargement. Mild to moderate MR, moderate TR. Moderate pulmonary hypertension.  2D Echo 07/2004 SUMMARY -  Overall left ventricular systolic function was normal. Left ventricular ejection fraction was estimated , range being 55 % to 65 %.. There were no left ventricular regional wall motion abnormalities. - Aortic valve thickness was mildly to moderately increased. - There was mild mitral valvular regurgitation. - Estimated peak right ventricular systolic pressure was 50 mmHg in the range of 40 mmHg to 60 mmHg. - There was moderate tricuspid valvular regurgitation. - There was a trivial pericardial effusion posterior to the heart. There was a large, left pleural effusion.   Surgical History:  Past Surgical History  Procedure Laterality Date  . Abdominal hysterectomy    . Appendectomy    . Cholecystectomy    . Esophagogastroduodenoscopy  03/31/2012    Procedure: ESOPHAGOGASTRODUODENOSCOPY (EGD);  Surgeon: Meryl Dare, MD,FACG;  Location: [facility];  Service: Endoscopy;  Laterality: N/A;     Home Meds: Prior to Admission medications   Medication Sig Start Date End Date Taking? Authorizing Onie Kasparek  aspirin EC 325 MG tablet Take 325 mg by mouth daily at 12 noon.   Yes Historical Keimya Briddell, MD  b complex vitamins tablet Take 1 tablet by mouth daily at 12 noon.   Yes Historical Nori Poland, MD  cholecalciferol (VITAMIN D) 1000 UNITS tablet Take 1,000 Units by mouth daily.   Yes Historical Sarahy Creedon, MD  citalopram (CELEXA) 10 MG tablet Take 10 mg by mouth every morning.   Yes Historical Yoshi Mancillas, MD  digoxin (LANOXIN) 0.125 MG tablet Take 0.0625 mg by mouth every morning.   Yes Historical Sapphire Tygart, MD  insulin glargine (LANTUS) 100 UNIT/ML injection Inject 45 Units into the skin at bedtime.   Yes Historical Asah Lamay, MD  insulin regular (NOVOLIN R,HUMULIN R) 100 units/mL injection Inject 4-25 Units into the skin 3 (three) times daily before meals. Per sliding scale: 0 - 120 = 4 units 121 - 150 = 5 units 151 - 200 = 10 units 201 - 250 = 15 units 251 - 300 = 20 units >300 = 25 units   Yes  Historical Annaly Skop, MD  latanoprost (XALATAN) 0.005 % ophthalmic solution Place 1 drop into both eyes at bedtime.   Yes Historical Siniya Lichty, MD  losartan (COZAAR) 50 MG tablet Take 50 mg by mouth every morning.   Yes Historical Jevin Camino, MD  Multiple Vitamins-Minerals (OCUVITE PRESERVISION PO) Take 1 tablet by mouth 2 (two) times daily.   Yes Historical Pyper Olexa, MD  timolol (BETIMOL) 0.5 % ophthalmic solution Place 1 drop into both eyes 2 (two) times daily.   Yes Historical Ricarda Atayde, MD    Inpatient Medications:  . aspirin EC  325 mg Oral Q1200  . digoxin  125 mcg Oral Q1200  . enoxaparin (LOVENOX) injection  60 mg Subcutaneous Q24H  . escitalopram  10 mg Oral q morning - 10a  . insulin aspart  0-9 Units Subcutaneous TID WC  . insulin glargine  35 Units Subcutaneous QHS  . levofloxacin (LEVAQUIN) IV  750 mg Intravenous Q48H  . pantoprazole  40 mg Oral Daily  . simvastatin  20 mg Oral QPM  . sodium chloride  3 mL Intravenous Q12H  .  timolol  1 drop Both Eyes BID    Allergies:  Allergies  Allergen Reactions  . Penicillins   . Sulfa Antibiotics     History   Social History  . Marital Status: Single    Spouse Name: N/A    Number of Children: N/A  . Years of Education: N/A   Occupational History  . Not on file.   Social History Main Topics  . Smoking status: Never Smoker   . Smokeless tobacco: Never Used  . Alcohol Use: No  . Drug Use: No  . Sexually Active: No   Other Topics Concern  . Not on file   Social History Narrative  . No narrative on file     Family History  Problem Relation Age of Onset  . Cancer Mother      Review of Systems: General: negative for chills, fever, night sweats or weight changes.  Cardiovascular: see above Dermatological: negative for rash Respiratory: negative for cough or wheezing recently Urologic: negative for hematuria Abdominal: negative for nausea, vomiting, diarrhea, bright red blood per rectum, melena, or  hematemesis Neurologic: negative for visual changes, syncope All other systems reviewed and are otherwise negative except as noted above.  Labs:  Recent Labs  02/24/13 0800 02/24/13 1050 02/24/13 1614 02/24/13 2145  TROPONINI <0.30 <0.30 <0.30 <0.30   Lab Results  Component Value Date   WBC 9.5 02/25/2013   HGB 10.9* 02/25/2013   HCT 29.8* 02/25/2013   MCV 89.5 02/25/2013   PLT 133* 02/25/2013    Recent Labs Lab 02/24/13 1050 02/25/13 0510  NA  --  136  K  --  4.0  CL  --  104  CO2  --  23  BUN  --  26*  CREATININE  --  1.33*  CALCIUM  --  8.7  PROT 7.1  --   BILITOT 1.1  --   ALKPHOS 71  --   ALT 19  --   AST 18  --   GLUCOSE  --  77   Radiology/Studies:  Dg Chest Port 1 View 02/24/2013  *RADIOLOGY REPORT*  Clinical Data: Atrial fibrillation.  Cough.  Diabetic.  PORTABLE CHEST - 1 VIEW  Comparison: 08/09/2004  Findings: Numerous leads and wires project over the chest. Moderate cardiomegaly.  Atherosclerosis in the transverse aorta. No pleural effusion or pneumothorax.  Mild pulmonary interstitial prominence.  Lower lobe predominant.  Patchy bibasilar airspace disease, greater right than left.  IMPRESSION: Bibasilar airspace disease.  Especially on the right, infection or aspiration is suspected.  Underlying pulmonary interstitial prominence could represent pulmonary venous congestion.  No overt congestive failure.   Original Report Authenticated By: Jeronimo Greaves, M.D.    EKG: atrial fib 87bpm low voltage QRS, nonspecific ST- T changes. No significant change in morphology from 03/2012 ekg except very slight (<0.52mm ST scooping V5-v6)  Physical Exam: Blood pressure 111/71, pulse 91, temperature 98.2 F (36.8 C), temperature source Oral, resp. rate 14, height 5\' 2"  (1.575 m), weight 147 lb (66.679 kg), SpO2 100.00%. General: Well developed, well nourished elderly WF in no acute distress. VERY HARD OF HEARING. Head: Normocephalic, atraumatic, sclera non-icteric, no xanthomas, nares  are without discharge.  Neck: Negative for carotid bruits. JVD not elevated. Lungs: Clear bilaterally to auscultation without wheezes, rales, or rhonchi. Breathing is unlabored. Heart: Irregularly irregular rhythm, rate controlled. with S1 S2. No rubs or gallops appreciated. Very soft short SEM RUSB. Abdomen: Soft, non-tender, non-distended with normoactive bowel sounds. No hepatomegaly. No rebound/guarding. No  obvious abdominal masses. Msk:  Strength and tone appear normal for age. Extremities: No clubbing or cyanosis. No edema.  Distal pedal pulses are 2+ and equal bilaterally. Neuro: Alert and oriented X 2 (thought she was at [facility] and was shocked to find out she was not). No facial asymmetry. No focal deficit. Moves all extremities spontaneously. Psych:  Responds to questions appropriately with a normal affect.   Assessment and Plan:  Ms. Enriques is a 79 y/o F with history of HTN, DM, ?stroke-like symptoms 1 year ago, on digoxin for many years for unclear reasons who presents with several days history of weakness. She was admitted and found to have AF with rates 110's and possible HCAP. Rates have improved but she still feels generally weak.  1. Atrial fibrillation with RVR, suspect paroxysmal 2. HCAP, on antibiotics 3. HTN 4. Diabetes mellitus 5. Probable dementia 6. H/o hematemesis 03/2012 with [place] Artist tear and erosive gastritis 7. Chronic anemia 8. Possible CKD stage III  Suspect she has had history of PAF in the past. It is unclear that her presentation of weakness and fatigue is directly related to her atrial fibrillation since her rate was not going incredibly fast and she is still somewhat fatigued while HR is 80. Would prefer to change digoxin to metoprolol given her age and renal insufficiency. She is being treated with antibiotics for pneumonia. After discussion with Dr. Excell Seltzer, will anticoagulate with Eliquis beginning tomorrow given that she has received Lovenox.  CHADS2 = 3 for age, HTN, DM. She has no formal history of stroke but describes an episode of transient disruption of thinking after standing in the past. Will use decreased dosing due to age and renal function. Note prior h/o hematemesis but no recent symptoms since that time. See below for additional thoughts.  Signed, Ronie Spies PA-C 02/25/2013, 1:09 PM  Patient seen, examined. Available data reviewed. Agree with findings, assessment, and plan as outlined by Ronie Spies, PA-C. This nice elderly lady appears younger than her stated age. Exam shows the heart to be irregularly irregular, lungs CTA, and no peripheral edema. EKG shows atrial fib without significant ST-T changes. Most prudent course seems to be one of rate-control and anticoagulation. We discussed pros and cons of anticoagulation, but with CHADS = 3 and good functional status I think it is indicated. I am going to start her on Eliquis. Also probably best to stop digoxin considering her impaired creatinine clearance. Will start metoprolol for rate-control.  Tonny Bollman, M.D. 02/25/2013 4:02 PM

## 2013-02-25 NOTE — Progress Notes (Signed)
[facility] PROGRESS NOTE  Morgan Morris:[id number] DOB: 01/30/1920 DOA: 02/24/2013 PCP: Juline Patch, MD  Assessment/Plan: Principal Problem:   HCAP (healthcare-associated pneumonia) Active Problems:   Leukocytosis   Atrial fibrillation    1. Weakness secondary to new-onset atrial fibrillation, [place] score of 2, and need PT evaluation to determine if she is not a fall risk and the patient can be started on xarelto, we'll check a TSH a 2-D echo, rate is controlled, continue digoxin, digoxin level subtherapeutic,requested cardiology consult 2. Healthcare associated pneumonia, chest x-ray shows by basilar airspace disease although the patient does not have any history of fever or cough. She does have a mildly elevated white count we'll continue her on vancomycin levofloxacin, DC vancomycin in MRSA PCR is negative and switch to   levofloxacin 3. PT OT eval 4. Diabetes Lantus decreased to 35 units continue sliding-scale insulin  Code Status: full Family Communication: family updated about patient's clinical progress Disposition Plan:  May need SNF, PT OT evaluation pending  Brief narrative: 79 year old female presents to the ER with weakness, she is a resident of assisted-living facility  Patient was found to have atrial fibrillation new onset with RVR with a rate in the 110s, she denied any fever chills or rigors. The patient denies any history of falls. She does have a history of diabetes. She had a mildly elevated white count of 10.8. She denied any chest pain any shortness of breath any lightheadedness, just weakness and inability to ambulate   Consultants:  None  Procedures:  None  Antibiotics:  Levofloxacin, vancomycin started on 3/5  HPI/Subjective:  continues to be weak, CBg mildly low this morning  Objective: Filed Vitals:   02/24/13 1349 02/24/13 2000 02/25/13 0000 02/25/13 0400  BP: 132/56 129/63 111/42 111/71  Pulse: 69 95  61  Temp: 97.4 F (36.3 C)  98 F (36.7 C)  98.2 F (36.8 C)  TempSrc: Oral Oral  Oral  Resp: 14     Height:      Weight:    66.679 kg (147 lb)  SpO2: 96% 97%  100%    Intake/Output Summary (Last 24 hours) at 02/25/13 0944 Last data filed at 02/24/13 2259  Gross per 24 hour  Intake    243 ml  Output    775 ml  Net   -532 ml    Exam:  Cardiovascular: RRR, S1 normal, S2 normal, no MRG, pulses symmetric and intact bilaterally  Pulmonary/Chest: CTAB, no wheezes, rales, or rhonchi  Abdominal: Soft. Non-tender, non-distended, bowel sounds are normal, no masses, organomegaly, or guarding present.  GU: no CVA tenderness Musculoskeletal: No joint deformities, erythema, or stiffness, ROM full and no nontender Ext: no edema and no cyanosis, pulses palpable bilaterally (DP and PT)  Hematology: no cervical, inginal, or axillary adenopathy.  Neurological: A&O x3, Strenght is normal and symmetric bilaterally, cranial nerve II-XII are grossly intact, no focal motor deficit, sensory intact to light touch bilaterally.  Skin: Warm, dry and intact. No rash, cyanosis, or clubbing.  Psychiatric: Normal mood and affect. speech and behavior is normal. Judgment and thought content normal. Cognition and memory are normal    Data Reviewed: Basic Metabolic Panel:  Recent Labs Lab 02/24/13 0800 02/25/13 0510  NA 137 136  K 4.0 4.0  CL 100 104  CO2 27 23  GLUCOSE 267* 77  BUN 33* 26*  CREATININE 1.36* 1.33*  CALCIUM 9.2 8.7  MG 2.5  --   PHOS 3.4  --  Liver Function Tests:  Recent Labs Lab 02/24/13 1050  AST 18  ALT 19  ALKPHOS 71  BILITOT 1.1  PROT 7.1  ALBUMIN 3.4*   No results found for this basename: LIPASE, AMYLASE,  in the last 168 hours No results found for this basename: AMMONIA,  in the last 168 hours  CBC:  Recent Labs Lab 02/24/13 0800 02/25/13 0510  WBC 10.8* 9.5  NEUTROABS 8.1*  --   HGB 10.3* 10.9*  HCT 28.5* 29.8*  MCV 89.3 89.5  PLT 156 133*    Cardiac Enzymes:  Recent  Labs Lab 02/24/13 0800 02/24/13 1050 02/24/13 1614 02/24/13 2145  TROPONINI <0.30 <0.30 <0.30 <0.30   BNP (last 3 results) No results found for this basename: PROBNP,  in the last 8760 hours   CBG:  Recent Labs Lab 02/24/13 1101 02/24/13 1646 02/24/13 2043 02/25/13 0756  GLUCAP 228* 186* 184* 68*    Recent Results (from the past 240 hour(s))  MRSA PCR SCREENING     Status: None   Collection Time    02/24/13 11:03 AM      Result Value Range Status   MRSA by PCR NEGATIVE  NEGATIVE Final   Comment:            The GeneXpert MRSA Assay (FDA     approved for NASAL specimens     only), is one component of a     comprehensive MRSA colonization     surveillance program. It is not     intended to diagnose MRSA     infection nor to guide or     monitor treatment for     MRSA infections.     Studies: Dg Chest Port 1 View  02/24/2013  *RADIOLOGY REPORT*  Clinical Data: Atrial fibrillation.  Cough.  Diabetic.  PORTABLE CHEST - 1 VIEW  Comparison: 08/09/2004  Findings: Numerous leads and wires project over the chest. Moderate cardiomegaly.  Atherosclerosis in the transverse aorta. No pleural effusion or pneumothorax.  Mild pulmonary interstitial prominence.  Lower lobe predominant.  Patchy bibasilar airspace disease, greater right than left.  IMPRESSION: Bibasilar airspace disease.  Especially on the right, infection or aspiration is suspected.  Underlying pulmonary interstitial prominence could represent pulmonary venous congestion.  No overt congestive failure.   Original Report Authenticated By: Jeronimo Greaves, M.D.     Scheduled Meds: . aspirin EC  325 mg Oral Q1200  . digoxin  125 mcg Oral Q1200  . enoxaparin (LOVENOX) injection  60 mg Subcutaneous Q24H  . escitalopram  10 mg Oral q morning - 10a  . insulin aspart  0-9 Units Subcutaneous TID WC  . insulin glargine  40 Units Subcutaneous QHS  . levofloxacin (LEVAQUIN) IV  750 mg Intravenous Q48H  . pantoprazole  40 mg Oral  Daily  . simvastatin  20 mg Oral QPM  . sodium chloride  3 mL Intravenous Q12H  . timolol  1 drop Both Eyes BID  . vancomycin  1,000 mg Intravenous Q24H   Continuous Infusions:   Principal Problem:   HCAP (healthcare-associated pneumonia) Active Problems:   Leukocytosis   Atrial fibrillation    Time spent: 40 minutes   [facility]  [facility] Pager [phone]. If 8PM-8AM, please contact night-coverage at [email or web address], password [facility] 02/25/2013, 9:44 AM  LOS: 1 day

## 2013-02-26 LAB — GLUCOSE, CAPILLARY
Glucose-Capillary: 77 mg/dL (ref 70–99)
Glucose-Capillary: 207 mg/dL — ABNORMAL HIGH (ref 70–99)

## 2013-02-26 MED ORDER — LEVOFLOXACIN IN D5W 500 MG/100ML IV SOLN
500.0000 mg | INTRAVENOUS | Status: DC
  Administered 2013-02-26: 500 mg via INTRAVENOUS
  Filled 2013-02-26 (×2): qty 100

## 2013-02-26 MED ORDER — INSULIN GLARGINE 100 UNIT/ML ~~LOC~~ SOLN: 30.0000 [IU] | Freq: Every day | SUBCUTANEOUS | Status: DC

## 2013-02-26 NOTE — Progress Notes (Signed)
[facility] PROGRESS NOTE  Morgan Morris YNW:[id number] DOB: 01-Nov-1920 DOA: 02/24/2013 PCP: Juline Patch, MD  Assessment/Plan: Principal Problem:   HCAP (healthcare-associated pneumonia) Active Problems:   Leukocytosis   Atrial fibrillation      1. Weakness secondary to new-onset atrial fibrillation, [place] score of 3, started on Eliquis call physical therapy recommends SNF, the goal is rate control and anticoagulation long-term, digoxin discontinued by cardiology 2. Healthcare associated pneumonia, chest x-ray shows by basilar airspace disease although the patient does not have any history of fever or cough. She does have a mildly elevated white count we'll continue , levofloxacin, vancomycin discontinued, PT OT eval 3. Diabetes Lantus decreased to 30 units continue sliding-scale insulin, given hypoglycemic episode this morning     Code Status: full  Family Communication: family updated about patient's clinical progress  Disposition Plan: May need SNF, needs SNF, anticipate discharge tomorrow   Brief narrative:  79 year old female presents to the ER with weakness, she is a resident of assisted-living facility  Patient was found to have atrial fibrillation new onset with RVR with a rate in the 110s, she denied any fever chills or rigors. The patient denies any history of falls. She does have a history of diabetes. She had a mildly elevated white count of 10.8. She denied any chest pain any shortness of breath any lightheadedness, just weakness and inability to ambulate  Consultants:  None Procedures:  None Antibiotics:  Levofloxacin, vancomycin started on 3/5     HPI/Subjective: Still feels tired and fatigued  Objective: Filed Vitals:   02/25/13 1154 02/25/13 1341 02/25/13 2056 02/26/13 0500  BP:  133/78 123/80 142/83  Pulse: 91 87 85 71  Temp:  97.7 F (36.5 C) 98 F (36.7 C) 98.2 F (36.8 C)  TempSrc:  Oral Oral Oral  Resp:  18 17 16   Height:       Weight:    66.543 kg (146 lb 11.2 oz)  SpO2:  94% 95% 97%    Intake/Output Summary (Last 24 hours) at 02/26/13 0828 Last data filed at 02/25/13 1700  Gross per 24 hour  Intake    480 ml  Output    600 ml  Net   -120 ml    Exam:  HENT:  Head: Atraumatic.  Nose: Nose normal.  Mouth/Throat: Oropharynx is clear and moist.  Eyes: Conjunctivae are normal. Pupils are equal, round, and reactive to light. No scleral icterus.  Neck: Neck supple. No tracheal deviation present.  Cardiovascular: Normal rate, regular rhythm, normal heart sounds and intact distal pulses.  Pulmonary/Chest: Effort normal and breath sounds normal. No respiratory distress.  Abdominal: Soft. Normal appearance and bowel sounds are normal. She exhibits no distension. There is no tenderness.  Musculoskeletal: She exhibits no edema and no tenderness.  Neurological: She is alert. No cranial nerve deficit.    Data Reviewed: Basic Metabolic Panel:  Recent Labs Lab 02/24/13 0800 02/25/13 0510  NA 137 136  K 4.0 4.0  CL 100 104  CO2 27 23  GLUCOSE 267* 77  BUN 33* 26*  CREATININE 1.36* 1.33*  CALCIUM 9.2 8.7  MG 2.5  --   PHOS 3.4  --     Liver Function Tests:  Recent Labs Lab 02/24/13 1050  AST 18  ALT 19  ALKPHOS 71  BILITOT 1.1  PROT 7.1  ALBUMIN 3.4*   No results found for this basename: LIPASE, AMYLASE,  in the last 168 hours No results found for this basename: AMMONIA,  in the  last 168 hours  CBC:  Recent Labs Lab 02/24/13 0800 02/25/13 0510  WBC 10.8* 9.5  NEUTROABS 8.1*  --   HGB 10.3* 10.9*  HCT 28.5* 29.8*  MCV 89.3 89.5  PLT 156 133*    Cardiac Enzymes:  Recent Labs Lab 02/24/13 0800 02/24/13 1050 02/24/13 1614 02/24/13 2145  TROPONINI <0.30 <0.30 <0.30 <0.30   BNP (last 3 results) No results found for this basename: PROBNP,  in the last 8760 hours   CBG:  Recent Labs Lab 02/25/13 0756 02/25/13 1116 02/25/13 1640 02/25/13 2018 02/26/13 0743  GLUCAP 68*  273* 171* 284* 63*    Recent Results (from the past 240 hour(s))  MRSA PCR SCREENING     Status: None   Collection Time    02/24/13 11:03 AM      Result Value Range Status   MRSA by PCR NEGATIVE  NEGATIVE Final   Comment:            The GeneXpert MRSA Assay (FDA     approved for NASAL specimens     only), is one component of a     comprehensive MRSA colonization     surveillance program. It is not     intended to diagnose MRSA     infection nor to guide or     monitor treatment for     MRSA infections.     Studies: Dg Chest Port 1 View  02/24/2013  *RADIOLOGY REPORT*  Clinical Data: Atrial fibrillation.  Cough.  Diabetic.  PORTABLE CHEST - 1 VIEW  Comparison: 08/09/2004  Findings: Numerous leads and wires project over the chest. Moderate cardiomegaly.  Atherosclerosis in the transverse aorta. No pleural effusion or pneumothorax.  Mild pulmonary interstitial prominence.  Lower lobe predominant.  Patchy bibasilar airspace disease, greater right than left.  IMPRESSION: Bibasilar airspace disease.  Especially on the right, infection or aspiration is suspected.  Underlying pulmonary interstitial prominence could represent pulmonary venous congestion.  No overt congestive failure.   Original Report Authenticated By: Jeronimo Greaves, M.D.     Scheduled Meds: . apixaban  2.5 mg Oral BID  . escitalopram  10 mg Oral q morning - 10a  . insulin aspart  0-9 Units Subcutaneous TID WC  . insulin glargine  35 Units Subcutaneous QHS  . levofloxacin (LEVAQUIN) IV  750 mg Intravenous Q48H  . metoprolol tartrate  25 mg Oral BID  . pantoprazole  40 mg Oral Daily  . simvastatin  20 mg Oral QPM  . sodium chloride  3 mL Intravenous Q12H  . timolol  1 drop Both Eyes BID   Continuous Infusions:   Principal Problem:   HCAP (healthcare-associated pneumonia) Active Problems:   Leukocytosis   Atrial fibrillation    Time spent: 40 minutes   [facility]  [facility] Pager [phone]. If  8PM-8AM, please contact night-coverage at [email or web address], password [facility] 02/26/2013, 8:28 AM  LOS: 2 days

## 2013-02-26 NOTE — Progress Notes (Signed)
Inpatient Diabetes Program Recommendations  AACE/ADA: New Consensus Statement on Inpatient Glycemic Control (2013)  Target Ranges:  Prepandial:   less than 140 mg/dL      Peak postprandial:   less than 180 mg/dL (1-2 hours)      Critically ill patients:  140 - 180 mg/dL   Reason for Visit: Hypoglycemia  Results for Morgan Morris, Morgan Morris (MRN [id number]) as of 02/26/2013 15:16  Ref. Range 02/26/2013 07:43 02/26/2013 08:32 02/26/2013 11:54  Glucose-Capillary Latest Range: 70-99 mg/dL 63 (L) 77 962 (H)    Inpatient Diabetes Program Recommendations Insulin - Basal: Decrease Lantus to 30 units QHS  Note: Continue to follow.  Thank you. Ailene Ards, RD, LDN, CDE Inpatient Diabetes Coordinator [phone]

## 2013-02-26 NOTE — Progress Notes (Signed)
  Echocardiogram 2D Echocardiogram has been performed.  CHUNG, KWONG 02/26/2013, 7:04 AM

## 2013-02-26 NOTE — Progress Notes (Signed)
Family request that pt. Be discharged tomorrow if possible. They said that pt. Does better in familiar surroundings. Pt.is confused at times.

## 2013-02-26 NOTE — Progress Notes (Signed)
ANTIBIOTIC CONSULT NOTE - FOLLOW UP  Pharmacy Consult for levaquin Indication: pneumonia  Allergies  Allergen Reactions  . Penicillins   . Sulfa Antibiotics     Patient Measurements: Height: 5\' 2"  (157.5 cm) Weight: 146 lb 11.2 oz (66.543 kg) IBW/kg (Calculated) : 50.1   Vital Signs: Temp: 98.2 F (36.8 C) (03/07 0500) Temp src: Oral (03/07 0500) BP: 142/83 mmHg (03/07 0500) Pulse Rate: 71 (03/07 0500) Intake/Output from previous day: 03/06 0701 - 03/07 0700 In: 480 [P.O.:480] Out: 600 [Urine:600] Intake/Output from this shift: Total I/O In: 360 [P.O.:360] Out: 401 [Urine:400; Stool:1]  Labs:  Recent Labs  02/24/13 0800 02/25/13 0510  WBC 10.8* 9.5  HGB 10.3* 10.9*  PLT 156 133*  CREATININE 1.36* 1.33*   Estimated Creatinine Clearance: 25.2 ml/min (by C-G formula based on Cr of 1.33). No results found for this basename: VANCOTROUGH, Leodis Binet, VANCORANDOM, GENTTROUGH, GENTPEAK, GENTRANDOM, TOBRATROUGH, TOBRAPEAK, TOBRARND, AMIKACINPEAK, AMIKACINTROU, AMIKACIN,  in the last 72 hours   Microbiology: Recent Results (from the past 720 hour(s))  MRSA PCR SCREENING     Status: None   Collection Time    02/24/13 11:03 AM      Result Value Range Status   MRSA by PCR NEGATIVE  NEGATIVE Final   Comment:            The GeneXpert MRSA Assay (FDA     approved for NASAL specimens     only), is one component of a     comprehensive MRSA colonization     surveillance program. It is not     intended to diagnose MRSA     infection nor to guide or     monitor treatment for     MRSA infections.    Admit Complaint: weakness  Anticoagulation: new-onset Afib with RVR (CHADS2 = 2), originally started full dose lovenox, now switched to Eliquis. CrCL ~ 25 ml/min, no bleeding, need PT to eval for fall risk. Hgb stable, plt slightly down.   Infectious Disease: LVQ D#3 for possible PNA, afebrile, WBC WNL, no cultures  Vanc 3/5 >> 3/6 LVQ 3/5 >>  Cardiovascular: HTN / new  Afib - VSS, CE negative x 4, digoxin level 0.4 (MD aware) - dig changed to metoprolol, also on Zocor, ASA d/c'd  Endocrinology: TSH WNL. hx DM, CBGs erratic 63-284, on SSI + Lantus 30/d (45/d PTA), pt will only agree to 30 units of Lantus - she titrates her dose  Gastrointestinal / Nutrition: hx erosive esophagitis / Mallory-Weiss tear - carb modified diet  Neurology: hx depression - on Lexapro  Nephrology: SCr 1.33, CrCL 25 ml/min, lytes WNL  Hematology / Oncology: hgb low but stable, plts trending down to 133  Home meds not yet resumed: Vit B Complex, Vit D, losartan, ocuvite  Best Practices: Eliquis, home meds addressed  Plan:  - Change levaquin to 500mg  IV Q 48 hrs - Monitor renal function, culture data - f/u cbc and sx of bleeding  Bayard Hugger, PharmD, BCPS  Clinical Pharmacist  Pager: [phone]

## 2013-02-26 NOTE — Progress Notes (Signed)
Hypoglycemic Event  CBG: 63  Time: 0728  Treatment: 15 GM carbohydrate snack  Symptoms: None  Follow-up CBG: Time:0830 CBG Result:77  Possible Reasons for Event: Medication regimen: pt states that sometimes with lantus her cbg drops in the morning  Comments/MD notified:MD notified; ordered to give Orange Juice & recheck cbg in 15 mins; pt given an additional orange juice after repeat cbg. Pt is also eating breakfast. Will continue to monitor the pt. Sanda Linger, RN    Eliane Decree R  Remember to initiate Hypoglycemia Order Set & complete

## 2013-02-27 LAB — BASIC METABOLIC PANEL
BUN: 31 mg/dL — ABNORMAL HIGH (ref 6–23)
Calcium: 9.2 mg/dL (ref 8.4–10.5)
Chloride: 101 mEq/L (ref 96–112)
Creatinine, Ser: 1.41 mg/dL — ABNORMAL HIGH (ref 0.50–1.10)
GFR calc Af Amer: 37 mL/min — ABNORMAL LOW (ref >90)

## 2013-02-27 LAB — GLUCOSE, CAPILLARY
Glucose-Capillary: 131 mg/dL — ABNORMAL HIGH (ref 70–99)
Glucose-Capillary: 172 mg/dL — ABNORMAL HIGH (ref 70–99)

## 2013-02-27 MED ORDER — INSULIN GLARGINE 100 UNIT/ML ~~LOC~~ SOLN: 30.0000 [IU] | Freq: Every day | SUBCUTANEOUS | Status: DC

## 2013-02-27 MED ORDER — APIXABAN 2.5 MG PO TABS: 2.5000 mg | ORAL_TABLET | Freq: Two times a day (BID) | ORAL | Status: DC

## 2013-02-27 MED ORDER — METOPROLOL TARTRATE 25 MG PO TABS: 25.0000 mg | ORAL_TABLET | Freq: Two times a day (BID) | ORAL | Status: DC

## 2013-02-27 MED ORDER — ALBUTEROL SULFATE HFA 108 (90 BASE) MCG/ACT IN AERS: 2.0000 | INHALATION_SPRAY | Freq: Four times a day (QID) | RESPIRATORY_TRACT | Status: AC | PRN

## 2013-02-27 MED ORDER — SIMVASTATIN 20 MG PO TABS: 20.0000 mg | ORAL_TABLET | Freq: Every evening | ORAL | Status: DC

## 2013-02-27 MED ORDER — LEVOFLOXACIN 500 MG PO TABS: 500.0000 mg | ORAL_TABLET | Freq: Every day | ORAL | Status: AC

## 2013-02-27 NOTE — Discharge Summary (Signed)
Physician Discharge Summary  MARILIN KOFMAN MRN: [id number] DOB/AGE: [birth year] 79 y.o.  PCP: Juline Patch, MD   Admit date: 02/24/2013 Discharge date: 02/27/2013  Discharge Diagnoses:   : 1. Atrial fibrillation with RVR, suspect paroxysmal  2. HCAP, on antibiotics  3. HTN  4. Diabetes mellitus  5. Probable dementia  6. H/o hematemesis 03/2012 with [place] Artist tear and erosive gastritis  7. Chronic anemia  8. Possible CKD stage III      Medication List    STOP taking these medications       aspirin EC 325 MG tablet     digoxin 0.125 MG tablet  Commonly known as:  LANOXIN     losartan 50 MG tablet  Commonly known as:  COZAAR      TAKE these medications       albuterol 108 (90 BASE) MCG/ACT inhaler  Commonly known as:  PROVENTIL HFA;VENTOLIN HFA  Inhale 2 puffs into the lungs every 6 (six) hours as needed for wheezing.     apixaban 2.5 MG Tabs tablet  Commonly known as:  ELIQUIS  Take 1 tablet (2.5 mg total) by mouth 2 (two) times daily.     b complex vitamins tablet  Take 1 tablet by mouth daily at 12 noon.     cholecalciferol 1000 UNITS tablet  Commonly known as:  VITAMIN D  Take 1,000 Units by mouth daily.     citalopram 10 MG tablet  Commonly known as:  CELEXA  Take 10 mg by mouth every morning.     insulin glargine 100 UNIT/ML injection  Commonly known as:  LANTUS  Inject 30 Units into the skin at bedtime.     insulin regular 100 units/mL injection  Commonly known as:  NOVOLIN R,HUMULIN R  Inject 4-25 Units into the skin 3 (three) times daily before meals. Per sliding scale:  0 - 120 = 4 units  121 - 150 = 5 units  151 - 200 = 10 units  201 - 250 = 15 units  251 - 300 = 20 units  >300 = 25 units     latanoprost 0.005 % ophthalmic solution  Commonly known as:  XALATAN  Place 1 drop into both eyes at bedtime.     levofloxacin 500 MG tablet  Commonly known as:  LEVAQUIN  Take 1 tablet (500 mg total) by mouth daily.      metoprolol tartrate 25 MG tablet  Commonly known as:  LOPRESSOR  Take 1 tablet (25 mg total) by mouth 2 (two) times daily.     OCUVITE PRESERVISION PO  Take 1 tablet by mouth 2 (two) times daily.     simvastatin 20 MG tablet  Commonly known as:  ZOCOR  Take 1 tablet (20 mg total) by mouth every evening.     timolol 0.5 % ophthalmic solution  Commonly known as:  BETIMOL  Place 1 drop into both eyes 2 (two) times daily.        Discharge Condition: stable  Disposition: 01-Home or Self Care   Consults:  cardiology  Significant Diagnostic Studies: Dg Chest Port 1 View  02/24/2013  *RADIOLOGY REPORT*  Clinical Data: Atrial fibrillation.  Cough.  Diabetic.  PORTABLE CHEST - 1 VIEW  Comparison: 08/09/2004  Findings: Numerous leads and wires project over the chest. Moderate cardiomegaly.  Atherosclerosis in the transverse aorta. No pleural effusion or pneumothorax.  Mild pulmonary interstitial prominence.  Lower lobe predominant.  Patchy bibasilar airspace disease, greater right than left.  IMPRESSION: Bibasilar airspace disease.  Especially on the right, infection or aspiration is suspected.  Underlying pulmonary interstitial prominence could represent pulmonary venous congestion.  No overt congestive failure.   Original Report Authenticated By: Jeronimo Greaves, M.D.     Echo LV EF: 60% - 65%  ------------------------------------------------------------ Indications: Shortness of breath 786.05.  ------------------------------------------------------------ History: PMH: Atrial fibrillation. Risk factors: Hypertension.  ------------------------------------------------------------ Study Conclusions  - Left ventricle: The cavity size was normal. Wall thickness was increased in a pattern of mild LVH. Systolic function was normal. The estimated ejection fraction was in the range of 60% to 65%. Wall motion was normal; there were no regional wall motion abnormalities.  Indeterminant diastolic function (atrial fibrillation). - Aortic valve: There was no stenosis. - Mitral valve: Mildly calcified annulus. Mild to moderate regurgitation. - Left atrium: The atrium was moderately dilated. - Right ventricle: The cavity size was normal. Systolic function was normal. - Right atrium: The atrium was mildly dilated. - Tricuspid valve: Moderate regurgitation. Peak RV-RA gradient: 45mm Hg (S). - Pulmonary arteries: PA peak pressure: 60mm Hg (S). - Systemic veins: IVC measured 2.5 cm with minimal respirophasic variation, suggesting RA pressure 15 mmHg. Impressions:  - The patient was in atrial fibrillation. Normal LV size and systolic function, EF 60-65%. Mild LV hypertrophy. Normal RV size and systolic function. Biatrial enlargement. Mild to moderate MR, moderate TR. Moderate pulmonary hypertension.    Microbiology: Recent Results (from the past 240 hour(s))  MRSA PCR SCREENING     Status: None   Collection Time    02/24/13 11:03 AM      Result Value Range Status   MRSA by PCR NEGATIVE  NEGATIVE Final   Comment:            The GeneXpert MRSA Assay (FDA     approved for NASAL specimens     only), is one component of a     comprehensive MRSA colonization     surveillance program. It is not     intended to diagnose MRSA     infection nor to guide or     monitor treatment for     MRSA infections.     Labs: Results for orders placed during the hospital encounter of 02/24/13 (from the past 48 hour(s))  GLUCOSE, CAPILLARY     Status: Abnormal   Collection Time    02/25/13  4:40 PM      Result Value Range   Glucose-Capillary 171 (*) 70 - 99 mg/dL   Comment 1 Documented in Chart    GLUCOSE, CAPILLARY     Status: Abnormal   Collection Time    02/25/13  8:18 PM      Result Value Range   Glucose-Capillary 284 (*) 70 - 99 mg/dL   Comment 1 Notify RN    GLUCOSE, CAPILLARY     Status: Abnormal   Collection Time    02/26/13  7:43 AM      Result Value  Range   Glucose-Capillary 63 (*) 70 - 99 mg/dL   Comment 1 Notify RN    GLUCOSE, CAPILLARY     Status: None   Collection Time    02/26/13  8:32 AM      Result Value Range   Glucose-Capillary 77  70 - 99 mg/dL   Comment 1 Notify RN    GLUCOSE, CAPILLARY     Status: Abnormal   Collection Time    02/26/13 11:54 AM      Result Value Range  Glucose-Capillary 207 (*) 70 - 99 mg/dL   Comment 1 Notify RN    GLUCOSE, CAPILLARY     Status: Abnormal   Collection Time    02/26/13  5:01 PM      Result Value Range   Glucose-Capillary 156 (*) 70 - 99 mg/dL   Comment 1 Notify RN    GLUCOSE, CAPILLARY     Status: Abnormal   Collection Time    02/26/13  9:13 PM      Result Value Range   Glucose-Capillary 67 (*) 70 - 99 mg/dL   Comment 1 Notify RN    GLUCOSE, CAPILLARY     Status: None   Collection Time    02/26/13  9:42 PM      Result Value Range   Glucose-Capillary 74  70 - 99 mg/dL   Comment 1 Notify RN    BASIC METABOLIC PANEL     Status: Abnormal   Collection Time    02/27/13  5:15 AM      Result Value Range   Sodium 138  135 - 145 mEq/L   Potassium 4.0  3.5 - 5.1 mEq/L   Chloride 101  96 - 112 mEq/L   CO2 27  19 - 32 mEq/L   Glucose, Bld 155 (*) 70 - 99 mg/dL   BUN 31 (*) 6 - 23 mg/dL   Creatinine, Ser 6.57 (*) 0.50 - 1.10 mg/dL   Calcium 9.2  8.4 - 84.6 mg/dL   GFR calc non Af Amer 32 (*) >90 mL/min   GFR calc Af Amer 37 (*) >90 mL/min   Comment:            The eGFR has been calculated     using the CKD EPI equation.     This calculation has not been     validated in all clinical     situations.     eGFR's persistently     <90 mL/min signify     possible Chronic Kidney Disease.  GLUCOSE, CAPILLARY     Status: Abnormal   Collection Time    02/27/13  7:47 AM      Result Value Range   Glucose-Capillary 131 (*) 70 - 99 mg/dL  GLUCOSE, CAPILLARY     Status: Abnormal   Collection Time    02/27/13 11:52 AM      Result Value Range   Glucose-Capillary 172 (*) 70 - 99  mg/dL     HPI : 79 y/o F with history of HTN, DM, Mallory Weiss tear with erosive gastritis 03/2012 who presented to [facility] with weakness and was found to have afib RVR rates 110's and possible HCAP by CXR. Per note from 2006, she had some kind of arrhythmia many years ago and has been on digoxin for at least several years but details are not clear. Last EKG in Epic 03/2012 was NSR. She states she "self diagnosed herself" with a stroke about a year ago when she had an instance where she stood up and suddenly had trouble thinking. She has had some memory trouble. She also reports hearing music when no music is playing on occasion. She lives at [facility]. She is A+O to self and date, but she was truly surprised to hear she was at [facility] instead of there during our interview.  For the past several days, she's had weakness, dizziness, and overall fatigue. She is sleeping more than usual. She had to stop and rest on the  way to the laundry room the other day due to feeling "just exhausted." Denies CP, SOB, syncope, LEE, orthopnea. She did not have any palpitations with this episode, but has had several episodes over the last 8 months "like my heart just starts racing without any reason." These can last for quite a while but then resolve spontaneously. On arrival to the ED, HR was 110 and BP 120/61, POx WNL. Labs significant for troponin neg x 4, WBC 10.8, BUN/Cr 26/1.33, dig level 0.4, UA with small leuk, normal TSH, Hgb 10.9. CXR showing bibasilar airspace disease especially on the R, infection versus aspiration suspected, with underlying pulmonary interstitial prominence that could represent pulmonary venous congestion. She was started on abx, reloaded with 0.25mg  IV digoxin and continued on her usual scheduled digoxin dose of 0.138mcg. HR presently in the 80's. No BRBPR, melena, hematuria, or hematemesis. Slight cough 2 months ago but none recently  HOSPITAL COURSE:   1. Weakness  secondary to new-onset atrial fibrillation, [place] score of 3, started on Eliquis by cardiology, physical therapy recommends SNF, the goal is rate control with metoprolol  and anticoagulation long-term, digoxin discontinued by cardiology due to impaired creatinine , f/u with Dr Excell Seltzer in one month ,   2. Healthcare associated pneumonia, chest x-ray shows by basilar airspace disease although the patient does not have any history of fever or cough. She does have a mildly elevated white count we'll continue , levofloxacin for another 6 days , vancomycin started upon admission ,later discontinued,     3.          Diabetes Lantus decreased to 30 units continue sliding-scale insulin, given hypoglycemic episode , continue SSI    Discharge Exam: *   Blood pressure 144/71, pulse 75, temperature 98.2 F (36.8 C), temperature source Oral, resp. rate 16, height 5\' 2"  (1.575 m), weight 68.947 kg (152 lb), SpO2 96.00%. General: Well developed, well nourished elderly WF in no acute distress. VERY HARD OF HEARING.  Head: Normocephalic, atraumatic, sclera non-icteric, no xanthomas, nares are without discharge.  Neck: Negative for carotid bruits. JVD not elevated.  Lungs: Clear bilaterally to auscultation without wheezes, rales, or rhonchi. Breathing is unlabored.  Heart: Irregularly irregular rhythm, rate controlled. with S1 S2. No rubs or gallops appreciated. Very soft short SEM RUSB.  Abdomen: Soft, non-tender, non-distended with normoactive bowel sounds. No hepatomegaly. No rebound/guarding. No obvious abdominal masses.  Msk: Strength and tone appear normal for age.  Extremities: No clubbing or cyanosis. No edema. Distal pedal pulses are 2+ and equal bilaterally.  Neuro: Alert and oriented X 2 (thought she was at [facility] and was shocked to find out she was not). No facial asymmetry. No focal deficit. Moves all extremities spontaneously.  Psych: Responds to questions appropriately with a normal affect.           Discharge Orders   Future Orders Complete By Expires     Diet - low sodium heart healthy  As directed     Increase activity slowly  As directed          Signed: ABROL,NAYANA 02/27/2013, 12:22 PM

## 2013-02-27 NOTE — Clinical Social Work Note (Signed)
CSW received consult today for possible SNF placement. Talked with patient's son via telephone and explained to him differences between Independent Living, Assisted Living and Skilled Nursing Facility care. Son stating patient lives in Independent Living and would talk to Jerrye Noble today about Assisted Living, having nurse monitor patient and patient receiving physical therapy there. CSW contacted [employer]. Loss adjuster, chartered confirming that she talked with son, but stating she is unsure whether patient will be going to Assisted Living. Recommended I contact Administrator, who was unavailable at the time of the call. I left message with my phone number to please call me back. CSW went to unit to talk to patient's son. Informed by RN that son left with patient to return to [facility].CSW will provide needed documents to Jerrye Noble if contacted. CSW signing off.  Ricke Hey, [place] [phone] (weekend)

## 2013-02-27 NOTE — Progress Notes (Signed)
Discharge instructions given to patient and family.  Prescriptions also given and medications reviewed with sons.  Pt. Discharged to [facility] facility.  Home care set up by family.  Pt. Escorted to exit via wheelchair accompanied by nurse tech.

## 2013-02-28 NOTE — Progress Notes (Signed)
   CARE MANAGEMENT NOTE 02/28/2013  Patient:  Morgan Morris, Morgan Morris   Account Number:  [id number]  Date Initiated:  02/28/2013  Documentation initiated by:  [facility]  Subjective/Objective Assessment:   HCAP, Afib     Action/Plan:   resides at [facility]   Anticipated DC Date:  02/28/2013   Anticipated DC Plan:  REST HOME      DC Planning Services  CM consult      [facility] Choice  HOME HEALTH   Choice offered to / List presented to:  C-4 Adult Children           Status of service:  Completed, signed off Medicare Important Message given?   (If response is "NO", the following Medicare IM given date fields will be blank) Date Medicare IM given:   Date Additional Medicare IM given:    Discharge Disposition:  REST HOME  Per UR Regulation:    If discussed at Long Length of Stay Meetings, dates discussed:    Comments:  3/93/2014 1100 NCM contacted pt's son, Ronetta Molla. States pt has care with [facility]. They provide aide and she has an escort to all her meals. He does not any additional services needed at this time. States his mother is ambulating well. Will follow up with [employer] to see if [facility] orders needed so they can bill her insurance for PT/OT.  Isidoro Donning RN CCM Case Mgmt phone [phone]

## 2013-03-27 ENCOUNTER — Inpatient Hospital Stay (HOSPITAL_COMMUNITY)
Admission: EM | Admit: 2013-03-27 | Discharge: 2013-04-01 | DRG: 291 | Disposition: A | Discharge status: Skilled Nursing Facility | Payer: Medicare Other | Attending: Internal Medicine | Admitting: Internal Medicine

## 2013-03-27 ENCOUNTER — Emergency Department (HOSPITAL_COMMUNITY): Payer: Medicare Other

## 2013-03-27 ENCOUNTER — Encounter (HOSPITAL_COMMUNITY): Payer: Self-pay | Admitting: Emergency Medicine

## 2013-03-27 DIAGNOSIS — J9600 Acute respiratory failure, unspecified whether with hypoxia or hypercapnia: Secondary | ICD-10-CM | POA: Diagnosis present

## 2013-03-27 DIAGNOSIS — E1169 Type 2 diabetes mellitus with other specified complication: Secondary | ICD-10-CM | POA: Diagnosis present

## 2013-03-27 DIAGNOSIS — Z880 Allergy status to penicillin: Secondary | ICD-10-CM

## 2013-03-27 DIAGNOSIS — I2789 Other specified pulmonary heart diseases: Secondary | ICD-10-CM | POA: Diagnosis present

## 2013-03-27 DIAGNOSIS — N390 Urinary tract infection, site not specified: Secondary | ICD-10-CM | POA: Diagnosis present

## 2013-03-27 DIAGNOSIS — I517 Cardiomegaly: Secondary | ICD-10-CM | POA: Diagnosis present

## 2013-03-27 DIAGNOSIS — D649 Anemia, unspecified: Secondary | ICD-10-CM | POA: Diagnosis present

## 2013-03-27 DIAGNOSIS — Z79899 Other long term (current) drug therapy: Secondary | ICD-10-CM

## 2013-03-27 DIAGNOSIS — D638 Anemia in other chronic diseases classified elsewhere: Secondary | ICD-10-CM | POA: Diagnosis present

## 2013-03-27 DIAGNOSIS — R0602 Shortness of breath: Secondary | ICD-10-CM | POA: Diagnosis present

## 2013-03-27 DIAGNOSIS — I509 Heart failure, unspecified: Secondary | ICD-10-CM | POA: Diagnosis present

## 2013-03-27 DIAGNOSIS — I129 Hypertensive chronic kidney disease with stage 1 through stage 4 chronic kidney disease, or unspecified chronic kidney disease: Secondary | ICD-10-CM | POA: Diagnosis present

## 2013-03-27 DIAGNOSIS — Z794 Long term (current) use of insulin: Secondary | ICD-10-CM

## 2013-03-27 DIAGNOSIS — N183 Chronic kidney disease, stage 3 unspecified: Secondary | ICD-10-CM | POA: Diagnosis present

## 2013-03-27 DIAGNOSIS — I4891 Unspecified atrial fibrillation: Secondary | ICD-10-CM | POA: Diagnosis present

## 2013-03-27 DIAGNOSIS — E876 Hypokalemia: Secondary | ICD-10-CM | POA: Diagnosis not present

## 2013-03-27 DIAGNOSIS — J9601 Acute respiratory failure with hypoxia: Secondary | ICD-10-CM

## 2013-03-27 DIAGNOSIS — I5031 Acute diastolic (congestive) heart failure: Principal | ICD-10-CM | POA: Diagnosis present

## 2013-03-27 DIAGNOSIS — E11649 Type 2 diabetes mellitus with hypoglycemia without coma: Secondary | ICD-10-CM | POA: Diagnosis not present

## 2013-03-27 DIAGNOSIS — F0390 Unspecified dementia without behavioral disturbance: Secondary | ICD-10-CM | POA: Diagnosis present

## 2013-03-27 LAB — URINALYSIS, ROUTINE W REFLEX MICROSCOPIC
Glucose, UA: NEGATIVE mg/dL
Protein, ur: 30 mg/dL — AB
Urobilinogen, UA: 1 mg/dL (ref 0.0–1.0)

## 2013-03-27 LAB — CBC
MCH: 31.2 pg (ref 26.0–34.0)
MCHC: 32.9 g/dL (ref 30.0–36.0)
Platelets: 193 10*3/uL (ref 150–400)
RDW: 14.9 % (ref 11.5–15.5)

## 2013-03-27 LAB — COMPREHENSIVE METABOLIC PANEL
ALT: 37 U/L — ABNORMAL HIGH (ref 0–35)
AST: 34 U/L (ref 0–37)
Albumin: 3.3 g/dL — ABNORMAL LOW (ref 3.5–5.2)
Calcium: 9 mg/dL (ref 8.4–10.5)
GFR calc Af Amer: 44 mL/min — ABNORMAL LOW (ref >90)
Glucose, Bld: 133 mg/dL — ABNORMAL HIGH (ref 70–99)
Sodium: 138 mEq/L (ref 135–145)
Total Protein: 6.4 g/dL (ref 6.0–8.3)

## 2013-03-27 LAB — URINE MICROSCOPIC-ADD ON

## 2013-03-27 LAB — LACTIC ACID, PLASMA: Lactic Acid, Venous: 1.1 mmol/L (ref 0.5–2.2)

## 2013-03-27 LAB — MRSA PCR SCREENING: MRSA by PCR: NEGATIVE

## 2013-03-27 LAB — APTT: aPTT: 37 seconds (ref 24–37)

## 2013-03-27 MED ORDER — DEXTROSE 5 % IV SOLN
1.0000 g | INTRAVENOUS | Status: DC
  Administered 2013-03-27 – 2013-03-28 (×2): 1 g via INTRAVENOUS
  Filled 2013-03-27 (×2): qty 10

## 2013-03-27 MED ORDER — ONDANSETRON HCL 4 MG PO TABS: 4.0000 mg | ORAL_TABLET | Freq: Four times a day (QID) | ORAL | Status: DC | PRN

## 2013-03-27 MED ORDER — INSULIN GLARGINE 100 UNIT/ML ~~LOC~~ SOLN
30.0000 [IU] | Freq: Every day | SUBCUTANEOUS | Status: DC
  Administered 2013-03-27: 30 [IU] via SUBCUTANEOUS
  Filled 2013-03-27 (×2): qty 0.3

## 2013-03-27 MED ORDER — IPRATROPIUM BROMIDE 0.02 % IN SOLN
0.5000 mg | Freq: Once | RESPIRATORY_TRACT | Status: AC
  Administered 2013-03-27: 0.5 mg via RESPIRATORY_TRACT
  Filled 2013-03-27: qty 2.5

## 2013-03-27 MED ORDER — ACETAMINOPHEN 325 MG PO TABS: 650.0000 mg | ORAL_TABLET | Freq: Four times a day (QID) | ORAL | Status: DC | PRN

## 2013-03-27 MED ORDER — TIMOLOL MALEATE 0.5 % OP SOLN
1.0000 [drp] | Freq: Two times a day (BID) | OPHTHALMIC | Status: DC
  Administered 2013-03-27 – 2013-04-01 (×11): 1 [drp] via OPHTHALMIC
  Filled 2013-03-27: qty 5

## 2013-03-27 MED ORDER — VITAMIN D3 25 MCG (1000 UNIT) PO TABS
1000.0000 [IU] | ORAL_TABLET | Freq: Every day | ORAL | Status: DC
  Administered 2013-03-28 – 2013-04-01 (×5): 1000 [IU] via ORAL
  Filled 2013-03-27 (×6): qty 1

## 2013-03-27 MED ORDER — HYDROCODONE-ACETAMINOPHEN 5-325 MG PO TABS: 1.0000 | ORAL_TABLET | ORAL | Status: DC | PRN

## 2013-03-27 MED ORDER — ESCITALOPRAM OXALATE 10 MG PO TABS
10.0000 mg | ORAL_TABLET | Freq: Every day | ORAL | Status: DC
  Administered 2013-03-28 – 2013-04-01 (×5): 10 mg via ORAL
  Filled 2013-03-27 (×6): qty 1

## 2013-03-27 MED ORDER — ALBUTEROL SULFATE (5 MG/ML) 0.5% IN NEBU: 2.5000 mg | INHALATION_SOLUTION | RESPIRATORY_TRACT | Status: DC | PRN

## 2013-03-27 MED ORDER — QUETIAPINE 12.5 MG HALF TABLET
12.5000 mg | ORAL_TABLET | ORAL | Status: DC
  Administered 2013-03-27 – 2013-03-31 (×3): 12.5 mg via ORAL
  Filled 2013-03-27 (×3): qty 1

## 2013-03-27 MED ORDER — TIMOLOL HEMIHYDRATE 0.5 % OP SOLN: 1.0000 [drp] | Freq: Two times a day (BID) | OPHTHALMIC | Status: DC

## 2013-03-27 MED ORDER — FUROSEMIDE 10 MG/ML IJ SOLN
20.0000 mg | Freq: Once | INTRAMUSCULAR | Status: AC
  Administered 2013-03-27: 20 mg via INTRAVENOUS
  Filled 2013-03-27: qty 4

## 2013-03-27 MED ORDER — SODIUM CHLORIDE 0.9 % IV SOLN
INTRAVENOUS | Status: DC
  Administered 2013-03-27: 20 mL/h via INTRAVENOUS

## 2013-03-27 MED ORDER — INSULIN ASPART 100 UNIT/ML ~~LOC~~ SOLN
0.0000 [IU] | Freq: Three times a day (TID) | SUBCUTANEOUS | Status: DC
  Administered 2013-03-28: 3 [IU] via SUBCUTANEOUS
  Administered 2013-03-28: 1 [IU] via SUBCUTANEOUS
  Administered 2013-03-29: 3 [IU] via SUBCUTANEOUS
  Administered 2013-03-29: 1 [IU] via SUBCUTANEOUS
  Administered 2013-03-30: 3 [IU] via SUBCUTANEOUS
  Administered 2013-03-30: 2 [IU] via SUBCUTANEOUS
  Administered 2013-03-30 – 2013-03-31 (×3): 1 [IU] via SUBCUTANEOUS
  Administered 2013-03-31: 5 [IU] via SUBCUTANEOUS
  Administered 2013-04-01: 3 [IU] via SUBCUTANEOUS

## 2013-03-27 MED ORDER — FUROSEMIDE 10 MG/ML IJ SOLN
40.0000 mg | Freq: Every day | INTRAMUSCULAR | Status: DC
  Administered 2013-03-27 – 2013-03-31 (×4): 40 mg via INTRAVENOUS
  Filled 2013-03-27 (×5): qty 4

## 2013-03-27 MED ORDER — INSULIN ASPART 100 UNIT/ML ~~LOC~~ SOLN
0.0000 [IU] | Freq: Every day | SUBCUTANEOUS | Status: DC
  Administered 2013-03-27: 4 [IU] via SUBCUTANEOUS

## 2013-03-27 MED ORDER — SIMVASTATIN 20 MG PO TABS
20.0000 mg | ORAL_TABLET | Freq: Every day | ORAL | Status: DC
  Administered 2013-03-27 – 2013-03-31 (×5): 20 mg via ORAL
  Filled 2013-03-27 (×6): qty 1

## 2013-03-27 MED ORDER — QUETIAPINE FUMARATE 25 MG PO TABS: 6.2500 mg | ORAL_TABLET | Freq: Every day | ORAL | Status: DC

## 2013-03-27 MED ORDER — ACETAMINOPHEN 650 MG RE SUPP: 650.0000 mg | Freq: Four times a day (QID) | RECTAL | Status: DC | PRN

## 2013-03-27 MED ORDER — ONDANSETRON HCL 4 MG/2ML IJ SOLN: 4.0000 mg | Freq: Four times a day (QID) | INTRAMUSCULAR | Status: DC | PRN

## 2013-03-27 MED ORDER — B COMPLEX PO TABS: 1.0000 | ORAL_TABLET | Freq: Every day | ORAL | Status: DC

## 2013-03-27 MED ORDER — LATANOPROST 0.005 % OP SOLN
1.0000 [drp] | Freq: Every day | OPHTHALMIC | Status: DC
  Administered 2013-03-27 – 2013-03-31 (×5): 1 [drp] via OPHTHALMIC
  Filled 2013-03-27: qty 2.5

## 2013-03-27 MED ORDER — APIXABAN 2.5 MG PO TABS
2.5000 mg | ORAL_TABLET | Freq: Two times a day (BID) | ORAL | Status: DC
  Administered 2013-03-27 – 2013-04-01 (×10): 2.5 mg via ORAL
  Filled 2013-03-27 (×12): qty 1

## 2013-03-27 MED ORDER — SODIUM CHLORIDE 0.9 % IJ SOLN
3.0000 mL | Freq: Two times a day (BID) | INTRAMUSCULAR | Status: DC
  Administered 2013-03-27 – 2013-03-31 (×5): 3 mL via INTRAVENOUS

## 2013-03-27 MED ORDER — METOPROLOL TARTRATE 25 MG PO TABS
25.0000 mg | ORAL_TABLET | Freq: Two times a day (BID) | ORAL | Status: DC
  Administered 2013-03-27 – 2013-04-01 (×10): 25 mg via ORAL
  Filled 2013-03-27 (×12): qty 1

## 2013-03-27 MED ORDER — NITROFURANTOIN MONOHYD MACRO 100 MG PO CAPS
100.0000 mg | ORAL_CAPSULE | Freq: Two times a day (BID) | ORAL | Status: DC
  Administered 2013-03-27 – 2013-03-28 (×3): 100 mg via ORAL
  Filled 2013-03-27 (×4): qty 1

## 2013-03-27 MED ORDER — B COMPLEX-C PO TABS
1.0000 | ORAL_TABLET | Freq: Every day | ORAL | Status: DC
  Administered 2013-03-28 – 2013-04-01 (×5): 1 via ORAL
  Filled 2013-03-27 (×6): qty 1

## 2013-03-27 MED ORDER — ENOXAPARIN SODIUM 30 MG/0.3ML ~~LOC~~ SOLN: 30.0000 mg | SUBCUTANEOUS | Status: DC

## 2013-03-27 MED ORDER — ALBUTEROL SULFATE (5 MG/ML) 0.5% IN NEBU
5.0000 mg | INHALATION_SOLUTION | Freq: Once | RESPIRATORY_TRACT | Status: AC
  Administered 2013-03-27: 5 mg via RESPIRATORY_TRACT
  Filled 2013-03-27: qty 1

## 2013-03-27 NOTE — ED Notes (Signed)
Two RNs attempted two times each to get IV access, both RNs unsuccessful.  Iv team paged.

## 2013-03-27 NOTE — ED Notes (Signed)
Scott with IV team PICC placement called and stated that they don't due stat PICCs, that I needed to page IV team at [facility] again to attempt IV access until PICC can be placed.

## 2013-03-27 NOTE — ED Notes (Signed)
Bed:WA14<BR> Expected date:<BR> Expected time:<BR> Means of arrival:<BR> Comments:<BR>

## 2013-03-27 NOTE — H&P (Signed)
[facility] History and Physical  Morgan Morris:[id number] DOB: June 25, 1920 DOA: 03/27/2013  Referring physician: ER physician PCP: Juline Patch, MD   Chief Complaint: Shortness of breath  HPI:  Pt is 79 yo female with diabetes, HTN, recent diagnosis and admission for atrial fibrillation with RVR at which time she also had PNA, now presenting to ED with main concern of progressively worsening shortness of breath that initially started several days prior to admission, initially with exertion and has progressed to dyspnea at rest. This has been associated with occasional episodes of non productive cough. Pt denies fevers, chills, no orthopnea or lower extremity swelling, no other systemic concerns, no chest pain. Pt denies dizziness, syncopal events, no specific focal neurological symptoms, no headaches or visual changes.  In ED, pt found to be in atrial fibrillation with RVR, rate in 120's. CXR with congestion and ? Atelectasis vs PNA.  Principal Problem:   Acute hypoxic respiratory failure - this is likely multifactorial and secondary to acute diastolic CHF exacerbation in the setting of atrial fibrillation  - pt is now more clinically stable and maintaining oxygen saturations at target range on [place] - will continue Lasix IV but will increase the dose to 40 mg QD - continue to monitor vitals per floor protocol, strict I's and O's, daily weight - this is unlikely PNA as pt just recently treated and currently afebrile and with normal WBC, if clinically decompensates may repeat imaging to decide if ABX indicated  Active Problems:   Acute diastolic CHF (congestive heart failure) - last 2 DE CHO 02/24/2013 with diastolic CHF, normal systolic function - treatment as noted above, monitor renal function in the setting of diuresis   Anemia of chronic disease - Hg stable and at pt's baseline, CBC in AM   Atrial fibrillation with RVR - continue metoprolol, will plan on starting Cardizem  drip if rate remains uncontrolled   CKD (chronic kidney disease), stage III - monitor renal function in the setting of diuresis   UTI (lower urinary tract infection) - Rocephin IV started, monitor urine output    Diabetes mellitus, type II - continue insulin per home medical regimen   Review of Systems:  Constitutional: Negative for fever, chills and malaise/fatigue. Negative for diaphoresis.  HENT: Negative for hearing loss, ear pain, nosebleeds, congestion, sore throat, neck pain, tinnitus and ear discharge.   Eyes: Negative for blurred vision, double vision, photophobia, pain, discharge and redness.  Respiratory: Positive for cough and shortness of breath, negative for hemoptysis, sputum production, wheezing and stridor.   Cardiovascular: Negative for chest pain, palpitations, orthopnea, claudication and leg swelling.  Gastrointestinal: Negative for nausea, vomiting and abdominal pain. Negative for heartburn, constipation, blood in stool and melena.  Genitourinary: Negative for dysuria, urgency, frequency, hematuria and flank pain.  Musculoskeletal: Negative for myalgias, back pain, joint pain and falls.  Skin: Negative for itching and rash.  Neurological: Negative for dizziness and weakness. Negative for tingling, tremors, sensory change, speech change, focal weakness, loss of consciousness and headaches.  Endo/Heme/Allergies: Negative for environmental allergies and polydipsia. Does not bruise/bleed easily.  Psychiatric/Behavioral: Negative for suicidal ideas. The patient is not nervous/anxious.      Past Medical History  Diagnosis Date  . Hypertension   . Diabetes mellitus   . Depression   . Erosive esophagitis 04/02/2012  . Mallory - Weiss tear 04/02/2012  . Dysrhythmia 02/24/2013    ATRIAL FIB WITH RVR  . Arthritis    Past Surgical History  Procedure Laterality Date  .  Abdominal hysterectomy    . Appendectomy    . Cholecystectomy    . Esophagogastroduodenoscopy  03/31/2012     Procedure: ESOPHAGOGASTRODUODENOSCOPY (EGD);  Surgeon: Meryl Dare, MD,FACG;  Location: [facility];  Service: Endoscopy;  Laterality: N/A;   Social History:  reports that she has never smoked. She has never used smokeless tobacco. She reports that she does not drink alcohol or use illicit drugs.  Allergies  Allergen Reactions  . Penicillins   . Sulfa Antibiotics     Family History: no history of cancers, no cardiovascular diseases on mother or father side  Prior to Admission medications   Medication Sig Start Date End Date Taking? Authorizing Provider  albuterol (PROVENTIL HFA;VENTOLIN HFA) 108 (90 BASE) MCG/ACT inhaler Inhale 2 puffs into the lungs every 6 (six) hours as needed for wheezing. 02/27/13  Yes Richarda Overlie, MD  apixaban (ELIQUIS) 2.5 MG TABS tablet Take 1 tablet (2.5 mg total) by mouth 2 (two) times daily. 02/27/13  Yes Richarda Overlie, MD  b complex vitamins tablet Take 1 tablet by mouth daily at 12 noon.   Yes Historical Provider, MD  cholecalciferol (VITAMIN D) 1000 UNITS tablet Take 1,000 Units by mouth daily.   Yes Historical Provider, MD  escitalopram (LEXAPRO) 10 MG tablet Take 10 mg by mouth daily.   Yes Historical Provider, MD  insulin glargine (LANTUS) 100 UNIT/ML injection Inject 30 Units into the skin at bedtime. 02/27/13  Yes Richarda Overlie, MD  insulin regular (NOVOLIN R,HUMULIN R) 100 units/mL injection Inject 4-25 Units into the skin 3 (three) times daily before meals. Per sliding scale: 0 - 120 = 4 units 121 - 150 = 5 units 151 - 200 = 10 units 201 - 250 = 15 units 251 - 300 = 20 units >300 = 25 units   Yes Historical Provider, MD  latanoprost (XALATAN) 0.005 % ophthalmic solution Place 1 drop into both eyes at bedtime.   Yes Historical Provider, MD  metoprolol tartrate (LOPRESSOR) 25 MG tablet Take 1 tablet (25 mg total) by mouth 2 (two) times daily. 02/27/13  Yes Richarda Overlie, MD  Multiple Vitamins-Minerals (OCUVITE PRESERVISION PO) Take 1 tablet by mouth 2  (two) times daily.   Yes Historical Provider, MD  QUEtiapine (SEROQUEL) 25 MG tablet Take 6.25 mg by mouth at bedtime.   Yes Historical Provider, MD  simvastatin (ZOCOR) 20 MG tablet Take 1 tablet (20 mg total) by mouth every evening. 02/27/13  Yes Richarda Overlie, MD  timolol (BETIMOL) 0.5 % ophthalmic solution Place 1 drop into both eyes 2 (two) times daily.   Yes Historical Provider, MD   Physical Exam: Filed Vitals:   03/27/13 0751 03/27/13 1013 03/27/13 1149  BP: 136/67 136/76   Pulse: 126 119   Temp:  96.6 F (35.9 C)   TempSrc:  Rectal   Resp: 19 15   SpO2: 99% 100% 99%    Physical Exam  Constitutional: Appears well-developed and well-nourished. No distress.  HENT: Normocephalic. External right and left ear normal. Oropharynx is clear and moist.  Eyes: Conjunctivae and EOM are normal. PERRLA, no scleral icterus.  Neck: Normal ROM. Neck supple. No JVD. No tracheal deviation. No thyromegaly.  CVS: IRRR, no gallops, no carotid bruit.  Pulmonary: Effort and breath sounds normal, no stridor, crackles at bases, somewhat decreased breath sounds Abdominal: Soft. BS +,  no distension, tenderness, rebound or guarding.  Musculoskeletal: Normal range of motion. No edema and no tenderness.  Lymphadenopathy: No lymphadenopathy noted, cervical, inguinal. Neuro: Alert.  Normal reflexes, muscle tone coordination. No cranial nerve deficit. Skin: Skin is warm and dry. No rash noted. Not diaphoretic. No erythema. No pallor.  Psychiatric: Normal mood and affect. Behavior, judgment, thought content normal.   Labs on Admission:  Basic Metabolic Panel:  Recent Labs Lab 03/27/13 1004  NA 138  K 3.9  CL 101  CO2 27  GLUCOSE 133*  BUN 28*  CREATININE 1.21*  CALCIUM 9.0   Liver Function Tests:  Recent Labs Lab 03/27/13 1004  AST 34  ALT 37*  ALKPHOS 59  BILITOT 0.9  PROT 6.4  ALBUMIN 3.3*   CBC:  Recent Labs Lab 03/27/13 1004  WBC 10.3  HGB 11.9*  HCT 36.2  MCV 94.8  PLT 193    Cardiac Enzymes:  Recent Labs Lab 03/27/13 1004  TROPONINI <0.30   CBG:  Recent Labs Lab 03/27/13 1145  GLUCAP 116*    Radiological Exams on Admission: Dg Chest 2 View 03/27/2013   1.  Cardiomegaly and increased edema.  2.  Small to moderate bilateral pleural effusions.  3.  Retrocardiac airspace opacities increased, probably passive atelectasis associated with the pleural effusions, pneumonia is not excluded.     EKG: Atrial fib with rate 120 bmp  Code Status: Full Family Communication: Pt at bedside Disposition Plan: Admit for further evaluation  Manson Passey, MD  [facility] Pager [phone]  If 7PM-7AM, please contact night-coverage [email or web address] Password [email or web address] 03/27/2013, 12:30 PM

## 2013-03-27 NOTE — ED Provider Notes (Addendum)
History     CSN: [id number]  Arrival date & time 03/27/13  0722   First MD Initiated Contact with Patient 03/27/13 [phone]      Chief Complaint  Patient presents with  . Shortness of Breath  . Atrial Fibrillation    (Consider location/radiation/quality/duration/timing/severity/associated sxs/prior treatment) Patient is a 79 y.o. female presenting with shortness of breath and atrial fibrillation. The history is provided by the patient.  Shortness of Breath Associated symptoms: cough   Associated symptoms: no abdominal pain, no chest pain, no fever, no headaches, no neck pain, no rash, no sore throat and no vomiting   Atrial Fibrillation Associated symptoms include shortness of breath. Pertinent negatives include no chest pain, no abdominal pain and no headaches.  pt from ecf, hx afib, from ecf w congestion, ?sob, for past day. Constant, persistent. Mild-mod. Pt states feels sob. Occasional non productive cough. No cp or discomfort. Denies fever or chills. Denies orthopnea or pnd. Bilateral lower leg swelling, chronic per pt, denies recent change. Was admitted last month w afib/rvr and hcap. w above symptoms, pt denies specific exacerbating or alleviating factors.   Past Medical History  Diagnosis Date  . Hypertension   . Diabetes mellitus   . Depression   . Erosive esophagitis 04/02/2012  . Mallory - Weiss tear 04/02/2012  . Dysrhythmia 02/24/2013    ATRIAL FIB WITH RVR  . Arthritis     Past Surgical History  Procedure Laterality Date  . Abdominal hysterectomy    . Appendectomy    . Cholecystectomy    . Esophagogastroduodenoscopy  03/31/2012    Procedure: ESOPHAGOGASTRODUODENOSCOPY (EGD);  Surgeon: Meryl Dare, MD,FACG;  Location: [facility];  Service: Endoscopy;  Laterality: N/A;    Family History  Problem Relation Age of Onset  . Cancer Mother     History  Substance Use Topics  . Smoking status: Never Smoker   . Smokeless tobacco: Never Used  . Alcohol Use: No     OB History   Grav Para Term Preterm Abortions TAB SAB Ect Mult Living                  Review of Systems  Constitutional: Negative for fever and chills.  HENT: Negative for sore throat and neck pain.   Eyes: Negative for redness.  Respiratory: Positive for cough and shortness of breath.   Cardiovascular: Positive for leg swelling. Negative for chest pain.  Gastrointestinal: Negative for vomiting and abdominal pain.  Genitourinary: Negative for dysuria and flank pain.  Musculoskeletal: Negative for back pain.  Skin: Negative for rash.  Neurological: Negative for headaches.  Hematological: Does not bruise/bleed easily.  Psychiatric/Behavioral: The patient is not nervous/anxious.     Allergies  Penicillins and Sulfa antibiotics  Home Medications   Current Outpatient Rx  Name  Route  Sig  Dispense  Refill  . albuterol (PROVENTIL HFA;VENTOLIN HFA) 108 (90 BASE) MCG/ACT inhaler   Inhalation   Inhale 2 puffs into the lungs every 6 (six) hours as needed for wheezing.   1 Inhaler   2   . apixaban (ELIQUIS) 2.5 MG TABS tablet   Oral   Take 1 tablet (2.5 mg total) by mouth 2 (two) times daily.   20 tablet   5   . b complex vitamins tablet   Oral   Take 1 tablet by mouth daily at 12 noon.         . cholecalciferol (VITAMIN D) 1000 UNITS tablet   Oral   Take  1,000 Units by mouth daily.         . citalopram (CELEXA) 10 MG tablet   Oral   Take 10 mg by mouth every morning.         . insulin glargine (LANTUS) 100 UNIT/ML injection   Subcutaneous   Inject 30 Units into the skin at bedtime.   10 mL   10   . insulin regular (NOVOLIN R,HUMULIN R) 100 units/mL injection   Subcutaneous   Inject 4-25 Units into the skin 3 (three) times daily before meals. Per sliding scale: 0 - 120 = 4 units 121 - 150 = 5 units 151 - 200 = 10 units 201 - 250 = 15 units 251 - 300 = 20 units >300 = 25 units         . latanoprost (XALATAN) 0.005 % ophthalmic solution   Both  Eyes   Place 1 drop into both eyes at bedtime.         . metoprolol tartrate (LOPRESSOR) 25 MG tablet   Oral   Take 1 tablet (25 mg total) by mouth 2 (two) times daily.   60 tablet   0   . Multiple Vitamins-Minerals (OCUVITE PRESERVISION PO)   Oral   Take 1 tablet by mouth 2 (two) times daily.         . simvastatin (ZOCOR) 20 MG tablet   Oral   Take 1 tablet (20 mg total) by mouth every evening.   30 tablet   5   . timolol (BETIMOL) 0.5 % ophthalmic solution   Both Eyes   Place 1 drop into both eyes 2 (two) times daily.           There were no vitals taken for this visit.  Physical Exam  Nursing note and vitals reviewed. Constitutional: She appears well-developed and well-nourished. No distress.  HENT:  Head: Atraumatic.  Mouth/Throat: Oropharynx is clear and moist.  Eyes: Conjunctivae are normal. No scleral icterus.  Neck: Neck supple. No JVD present. No tracheal deviation present.  Cardiovascular: Normal heart sounds and intact distal pulses.   Tachy, afib  Pulmonary/Chest: Effort normal. No respiratory distress.  Basilar rale. Sl wheeze.   Abdominal: Soft. Normal appearance and bowel sounds are normal. She exhibits no distension. There is no tenderness.  Musculoskeletal: She exhibits edema. She exhibits no tenderness.  Symmetric bil lower leg edema.   Neurological: She is alert.  Skin: Skin is warm and dry. No rash noted. She is not diaphoretic.  Psychiatric: She has a normal mood and affect.    ED Course  Procedures (including critical care time)   Results for orders placed during the hospital encounter of 03/27/13  CBC      Result Value Range   WBC 10.3  4.0 - 10.5 K/uL   RBC 3.82 (*) 3.87 - 5.11 MIL/uL   Hemoglobin 11.9 (*) 12.0 - 15.0 g/dL   HCT 16.1  09.6 - 04.5 %   MCV 94.8  78.0 - 100.0 fL   MCH 31.2  26.0 - 34.0 pg   MCHC 32.9  30.0 - 36.0 g/dL   RDW 40.9  81.1 - 91.4 %   Platelets 193  150 - 400 K/uL  COMPREHENSIVE METABOLIC PANEL       Result Value Range   Sodium 138  135 - 145 mEq/L   Potassium 3.9  3.5 - 5.1 mEq/L   Chloride 101  96 - 112 mEq/L   CO2 27  19 - 32 mEq/L  Glucose, Bld 133 (*) 70 - 99 mg/dL   BUN 28 (*) 6 - 23 mg/dL   Creatinine, Ser 1.91 (*) 0.50 - 1.10 mg/dL   Calcium 9.0  8.4 - 47.8 mg/dL   Total Protein 6.4  6.0 - 8.3 g/dL   Albumin 3.3 (*) 3.5 - 5.2 g/dL   AST 34  0 - 37 U/L   ALT 37 (*) 0 - 35 U/L   Alkaline Phosphatase 59  39 - 117 U/L   Total Bilirubin 0.9  0.3 - 1.2 mg/dL   GFR calc non Af Amer 38 (*) >90 mL/min   GFR calc Af Amer 44 (*) >90 mL/min  TROPONIN I      Result Value Range   Troponin I <0.30  <0.30 ng/mL  PRO B NATRIURETIC PEPTIDE      Result Value Range   Pro B Natriuretic peptide (BNP) 3476.0 (*) 0 - 450 pg/mL  URINALYSIS, ROUTINE W REFLEX MICROSCOPIC      Result Value Range   Color, Urine AMBER (*) YELLOW   APPearance TURBID (*) CLEAR   Specific Gravity, Urine 1.021  1.005 - 1.030   pH 5.0  5.0 - 8.0   Glucose, UA NEGATIVE  NEGATIVE mg/dL   Hgb urine dipstick TRACE (*) NEGATIVE   Bilirubin Urine NEGATIVE  NEGATIVE   Ketones, ur NEGATIVE  NEGATIVE mg/dL   Protein, ur 30 (*) NEGATIVE mg/dL   Urobilinogen, UA 1.0  0.0 - 1.0 mg/dL   Nitrite NEGATIVE  NEGATIVE   Leukocytes, UA MODERATE (*) NEGATIVE  LACTIC ACID, PLASMA      Result Value Range   Lactic Acid, Venous 1.1  0.5 - 2.2 mmol/L  URINE MICROSCOPIC-ADD ON      Result Value Range   Squamous Epithelial / LPF MANY (*) RARE   WBC, UA 11-20  <3 WBC/hpf   RBC / HPF 0-2  <3 RBC/hpf   Bacteria, UA MANY (*) RARE   Urine-Other AMORPHOUS URATES/PHOSPHATES     Dg Chest 2 View  03/27/2013  *RADIOLOGY REPORT*  Clinical Data: Shortness of breath.  Hypertension.  CHEST - 2 VIEW  Comparison: 02/24/2013  Findings: Cardiomegaly noted with bilateral interstitial accentuation, Kerley B lines, and bilateral pleural effusions. Increased retrocardiac airspace opacity noted.  Increase in degree of edema noted.  There is fluid in the  minor fissure.  IMPRESSION:  1.  Cardiomegaly and increased edema. 2.  Small to moderate bilateral pleural effusions. 3.  Retrocardiac airspace opacities increased, probably passive atelectasis associated with the pleural effusions, pneumonia is not excluded.   Original Report Authenticated By: Gaylyn Rong, M.D.       MDM  Iv ns. Monitor. O2. Labs. Cxr.  Reviewed nursing notes and prior charts for additional history.   Pt with hx afib, recent admit for uncontrolled afib and hcap.   Date: 03/27/2013  Rate: 122  Rhythm: atrial fibrillation  QRS Axis: normal  Intervals: normal  ST/T Wave abnormalities: nonspecific ST changes  Conduction Disutrbances:afib  Narrative Interpretation:   Old EKG Reviewed: unchanged   cxr w vascul congestion/chf, elevated bnp.   Lasix iv.   Recheck hr 100, afib, no requiring iv meds currently for rate control.  Alb neb re mild wheezing.  Possible uti on labs, cx sent, rocephin iv.  [facility].   [facility] states tele, team 1, devine, requests card be consulted as well. Cardiology called.   Discussed w [facility] card on call, they reviewed pt/chart - they indicate for  med service to recontact them after their eval of pt should they still want cardiology consult.     Suzi Roots, MD 03/27/13 [phone]

## 2013-03-27 NOTE — ED Notes (Signed)
Called floor to give report. RN unavailable at this time, awaiting a call back.  

## 2013-03-27 NOTE — ED Notes (Signed)
Pt comes from [facility], getting over PNA, pt c/o nasal congestion and wheezing but cleared up when EMS gave neb treatment.  Also SHOB with activity. Pt denies pain but does have about 1+  Edema in bilat ankles, pt shows afib with RVR on monitor and 12lead done by EMS.

## 2013-03-28 DIAGNOSIS — E11649 Type 2 diabetes mellitus with hypoglycemia without coma: Secondary | ICD-10-CM | POA: Diagnosis not present

## 2013-03-28 DIAGNOSIS — J9601 Acute respiratory failure with hypoxia: Secondary | ICD-10-CM | POA: Diagnosis present

## 2013-03-28 DIAGNOSIS — N390 Urinary tract infection, site not specified: Secondary | ICD-10-CM

## 2013-03-28 DIAGNOSIS — J9600 Acute respiratory failure, unspecified whether with hypoxia or hypercapnia: Secondary | ICD-10-CM

## 2013-03-28 DIAGNOSIS — E1169 Type 2 diabetes mellitus with other specified complication: Secondary | ICD-10-CM

## 2013-03-28 LAB — GLUCOSE, CAPILLARY
Glucose-Capillary: 45 mg/dL — ABNORMAL LOW (ref 70–99)
Glucose-Capillary: 69 mg/dL — ABNORMAL LOW (ref 70–99)
Glucose-Capillary: 112 mg/dL — ABNORMAL HIGH (ref 70–99)
Glucose-Capillary: 211 mg/dL — ABNORMAL HIGH (ref 70–99)

## 2013-03-28 LAB — URINE CULTURE

## 2013-03-28 LAB — CBC
HCT: 33.3 % — ABNORMAL LOW (ref 36.0–46.0)
Hemoglobin: 10.9 g/dL — ABNORMAL LOW (ref 12.0–15.0)
MCH: 31.1 pg (ref 26.0–34.0)
MCHC: 32.7 g/dL (ref 30.0–36.0)

## 2013-03-28 LAB — COMPREHENSIVE METABOLIC PANEL
ALT: 31 U/L (ref 0–35)
AST: 48 U/L — ABNORMAL HIGH (ref 0–37)
Alkaline Phosphatase: 53 U/L (ref 39–117)
CO2: 31 mEq/L (ref 19–32)
Calcium: 8.8 mg/dL (ref 8.4–10.5)
Chloride: 99 mEq/L (ref 96–112)
GFR calc non Af Amer: 38 mL/min — ABNORMAL LOW (ref >90)
Potassium: 3.6 mEq/L (ref 3.5–5.1)
Sodium: 137 mEq/L (ref 135–145)

## 2013-03-28 MED ORDER — GLUCOSE 40 % PO GEL
ORAL | Status: AC
  Administered 2013-03-28: 37.5 g via ORAL
  Filled 2013-03-28: qty 1

## 2013-03-28 MED ORDER — GLUCOSE 40 % PO GEL: 1.0000 | ORAL | Status: DC | PRN

## 2013-03-28 MED ORDER — CIPROFLOXACIN HCL 500 MG PO TABS
500.0000 mg | ORAL_TABLET | Freq: Every day | ORAL | Status: DC
  Administered 2013-03-28: 500 mg via ORAL
  Filled 2013-03-28 (×2): qty 1

## 2013-03-28 MED ORDER — INSULIN GLARGINE 100 UNIT/ML ~~LOC~~ SOLN
10.0000 [IU] | Freq: Every day | SUBCUTANEOUS | Status: DC
  Administered 2013-03-28 – 2013-03-30 (×3): 10 [IU] via SUBCUTANEOUS
  Filled 2013-03-28 (×4): qty 0.1

## 2013-03-28 NOTE — Progress Notes (Addendum)
[facility] PROGRESS NOTE  Morgan Morris ZOX:[id number] DOB: 1920/10/24 DOA: 03/27/2013 PCP: Juline Patch, MD  Brief narrative 79 yo female with diabetes, HTN, recent diagnosis and admission for atrial fibrillation with RVR at which time she also had PNA, now presenting to ED with main concern of progressively worsening shortness of breath that initially started several days prior to admission, initially with exertion and has progressed to dyspnea at rest. This has been associated with occasional episodes of non productive cough. Pt denies fevers, chills, no orthopnea or lower extremity swelling, no other systemic concerns, no chest pain. Pt denies dizziness, syncopal events, no specific focal neurological symptoms, no headaches or visual changes.   In ED, pt found to be in atrial fibrillation with RVR, rate in 120's. CXR with congestion and ? Atelectasis vs PNA.   Assessment/Plan: 1. Acute hypoxic respiratory failure: Secondary to decompensated CHF. Pneumonia seems less likely. Improving. Titrate oxygen as tolerated. 2. Acute diastolic CHF: Probably precipitated by rapid A. fib. Continue IV Lasix. Improving. Recent echo 02/24/13: EF 60-65%. Mild LV hypertrophy. Moderate pulmonary hypertension. 3. Type 2 diabetes mellitus with hypoglycemia: Likely secondary to poor oral intake. Reduce insulins and monitor closely. 4. Presumed UTI: Continue IV Rocephin pending cultures. 5. A. fib with RVR: Rate better controlled. Continue metoprolol and Apixaban. 6. Anemia of chronic disease: Stable. 7. Stage III chronic kidney disease: Creatinine stable. Follow BMP while on diuretics. 8. Possible dementia:  Code Status: Full Family Communication: Discussed with son, Mr Egan Sahlin via phone. Disposition Plan: Home when stable.   Consultants:  None  Procedures:  None  Antibiotics:  IV Rocephin 4/5 >   HPI/Subjective: Patient is a poor historian/confused. Unable to say if her dyspnea is  better or not. Per nursing, no acute issues.  Objective: Filed Vitals:   03/27/13 1501 03/27/13 2128 03/28/13 0543 03/28/13 1339  BP:  138/81 130/85 108/54  Pulse:  94 61 118  Temp:  98.4 F (36.9 C) 98.6 F (37 C) 98.3 F (36.8 C)  TempSrc:  Oral Oral Oral  Resp:  18 18 18   Height: 5\' 2"  (1.575 m)     Weight: 68.947 kg (152 lb)  68.7 kg (151 lb 7.3 oz)   SpO2:  97% 96% 98%    Intake/Output Summary (Last 24 hours) at 03/28/13 1443 Last data filed at 03/28/13 1439  Gross per 24 hour  Intake    360 ml  Output   3026 ml  Net  -2666 ml   Filed Weights   03/27/13 1501 03/28/13 0543  Weight: 68.947 kg (152 lb) 68.7 kg (151 lb 7.3 oz)    Exam:   General exam: Comfortable. Lying supine in bed.  Respiratory system: Reduced breath sounds in the bases. Few basal crackles otherwise clear to auscultation. No increased work of breathing.  Cardiovascular system: S1 & S2 heard, irregular. No JVD, murmurs, gallops, clicks. 1+ pitting bilateral leg edema. Telemetry: A. fib with CVR in 90s.  Gastrointestinal system: Abdomen is nondistended, soft and nontender. Normal bowel sounds heard.  Central nervous system: Alert and oriented to self and place. No focal neurological deficits.  Extremities: Symmetric 5 x 5 power.   Data Reviewed: Basic Metabolic Panel:  Recent Labs Lab 03/27/13 1004 03/28/13 0519  NA 138 137  K 3.9 3.6  CL 101 99  CO2 27 31  GLUCOSE 133* 57*  BUN 28* 26*  CREATININE 1.21* 1.22*  CALCIUM 9.0 8.8  MG 2.5  --   PHOS 4.2  --  Liver Function Tests:  Recent Labs Lab 03/27/13 1004 03/28/13 0519  AST 34 48*  ALT 37* 31  ALKPHOS 59 53  BILITOT 0.9 0.8  PROT 6.4 6.1  ALBUMIN 3.3* 3.0*   No results found for this basename: LIPASE, AMYLASE,  in the last 168 hours No results found for this basename: AMMONIA,  in the last 168 hours CBC:  Recent Labs Lab 03/27/13 1004 03/28/13 0519  WBC 10.3 10.1  HGB 11.9* 10.9*  HCT 36.2 33.3*  MCV 94.8  94.9  PLT 193 206   Cardiac Enzymes:  Recent Labs Lab 03/27/13 1004  TROPONINI <0.30   BNP (last 3 results)  Recent Labs  03/27/13 1004  PROBNP 3476.0*   CBG:  Recent Labs Lab 03/28/13 0650 03/28/13 0730 03/28/13 0751 03/28/13 0839 03/28/13 1156  GLUCAP 45* 69* 64* 112* 211*    Recent Results (from the past 240 hour(s))  URINE CULTURE     Status: None   Collection Time    03/27/13  9:42 AM      Result Value Range Status   Specimen Description URINE, CLEAN CATCH   Final   Special Requests NONE   Final   Culture  Setup Time 03/27/2013 13:36   Final   Colony Count 75,000 COLONIES/ML   Final   Culture     Final   Value: Multiple bacterial morphotypes present, none predominant. Suggest appropriate recollection if clinically indicated.   Report Status 03/28/2013 FINAL   Final  MRSA PCR SCREENING     Status: None   Collection Time    03/27/13  2:27 PM      Result Value Range Status   MRSA by PCR NEGATIVE  NEGATIVE Final   Comment:            The GeneXpert MRSA Assay (FDA     approved for NASAL specimens     only), is one component of a     comprehensive MRSA colonization     surveillance program. It is not     intended to diagnose MRSA     infection nor to guide or     monitor treatment for     MRSA infections.     Studies: Dg Chest 2 View  03/27/2013  *RADIOLOGY REPORT*  Clinical Data: Shortness of breath.  Hypertension.  CHEST - 2 VIEW  Comparison: 02/24/2013  Findings: Cardiomegaly noted with bilateral interstitial accentuation, Kerley B lines, and bilateral pleural effusions. Increased retrocardiac airspace opacity noted.  Increase in degree of edema noted.  There is fluid in the minor fissure.  IMPRESSION:  1.  Cardiomegaly and increased edema. 2.  Small to moderate bilateral pleural effusions. 3.  Retrocardiac airspace opacities increased, probably passive atelectasis associated with the pleural effusions, pneumonia is not excluded.   Original Report  Authenticated By: Gaylyn Rong, M.D.      Additional labs:   Scheduled Meds: . apixaban  2.5 mg Oral BID  . B-complex with vitamin C  1 tablet Oral Daily  . cefTRIAXone (ROCEPHIN)  IV  1 g Intravenous Q24H  . cholecalciferol  1,000 Units Oral Daily  . escitalopram  10 mg Oral Daily  . furosemide  40 mg Intravenous Daily  . insulin aspart  0-5 Units Subcutaneous QHS  . insulin aspart  0-9 Units Subcutaneous TID WC  . insulin glargine  30 Units Subcutaneous QHS  . latanoprost  1 drop Both Eyes QHS  . metoprolol tartrate  25 mg Oral BID  . nitrofurantoin (  macrocrystal-monohydrate)  100 mg Oral Q12H  . QUEtiapine  12.5 mg Oral Q48H  . simvastatin  20 mg Oral Q supper  . sodium chloride  3 mL Intravenous Q12H  . timolol  1 drop Both Eyes BID   Continuous Infusions: . sodium chloride 20 mL/hr (03/27/13 1141)    Principal Problem:   Shortness of breath Active Problems:   Anemia   Atrial fibrillation   CKD (chronic kidney disease), stage III   UTI (lower urinary tract infection)   Acute diastolic CHF (congestive heart failure)    Time spent: 40 minutes    [facility]  [facility] Pager [phone].   If 8PM-8AM, please contact night-coverage at [email or web address], password [facility] 03/28/2013, 2:43 PM  LOS: 1 day

## 2013-03-28 NOTE — Progress Notes (Signed)
AM blood sugar per lab is 57.  Pt is asymptomatic. 8 oz orange juice given.  She reports that at home her blood sugars run mostly in the 200's and 300's, but she has had some recent low blood sugars at night.  She says her Lantus was reduced to 10 units at night and she thinks she also takes some in the morning, but is not sure how much.  She got 30 units per order last night.  Berton Bon RN

## 2013-03-28 NOTE — Evaluation (Signed)
Physical Therapy Evaluation Patient Details Name: Morgan Morris MRN: [id number] DOB: 1919/12/28 Today's Date: 03/28/2013 Time: 4098-1191 PT Time Calculation (min): 15 min  PT Assessment / Plan / Recommendation Clinical Impression  Pt is a 79 y.o. female with h/o a-fib admitted with CHF exacerbation and B pleural effusions. Pt was independent with mobility using RW at ALF prior to admission. Today she walked 200' with RW with supervision. Distance limited by SOB/fatigue. (attempted pulse ox reading, but machine didn't pick up waveform) HHPT at ALF recommended. Pt would benefit from acute PT to maximize safety and independence with mobility.      PT Assessment  Patient needs continued PT services    Follow Up Recommendations  Home health PT;Supervision - Intermittent    Does the patient have the potential to tolerate intense rehabilitation      Barriers to Discharge None      Equipment Recommendations  None recommended by PT    Recommendations for Other Services     Frequency Min 3X/week    Precautions / Restrictions Precautions Precautions: None Restrictions Weight Bearing Restrictions: No   Pertinent Vitals/Pain *pt denies pain Reports SOB after walking 100'  Unreliable dynamap reading for HR and SaO2 with walking At rest SaO2 96% on RA**      Mobility  Bed Mobility Bed Mobility: Supine to Sit Supine to Sit: 5: Supervision Transfers Transfers: Sit to Stand;Stand to Sit Sit to Stand: 5: Supervision Stand to Sit: 5: Supervision Details for Transfer Assistance: supervision for safety Ambulation/Gait Ambulation/Gait Assistance: 5: Supervision Ambulation Distance (Feet): 200 Feet Assistive device: Rolling walker Gait Pattern: Within Functional Limits General Gait Details: good sequencing, no LOB, pt reported some SOB with walking, attempted pulse ox reading but dynamap did not pick up good waveform    Exercises     PT Diagnosis: Generalized weakness;Difficulty  walking  PT Problem List: Decreased activity tolerance;Decreased mobility;Cardiopulmonary status limiting activity PT Treatment Interventions: Gait training;Functional mobility training;Therapeutic exercise;Therapeutic activities;Patient/family education   PT Goals Acute Rehab PT Goals PT Goal Formulation: With patient Time For Goal Achievement: 04/11/13 Potential to Achieve Goals: Good Pt will go Supine/Side to Sit: Independently;with HOB 0 degrees PT Goal: Supine/Side to Sit - Progress: Goal set today Pt will go Sit to Stand: with modified independence PT Goal: Sit to Stand - Progress: Goal set today Pt will Ambulate: >150 feet;with modified independence;with rolling walker PT Goal: Ambulate - Progress: Goal set today Pt will Perform Home Exercise Program: Independently PT Goal: Perform Home Exercise Program - Progress: Goal set today  Visit Information  Last PT Received On: 03/28/13 Assistance Needed: +1    Subjective Data  Subjective: It'll be good to be out of the bed.  Patient Stated Goal: return to prior level of function   Prior Functioning  Home Living Type of Home: Assisted living [facility]) Home Layout: One level Bathroom Shower/Tub: Walk-in shower Home Adaptive Equipment: Environmental consultant - rolling;Built-in shower seat;Grab bars in shower;Grab bars around toilet Prior Function Level of Independence: Independent with assistive device(s) Comments: used RW at ALF Communication Communication: HOH    Cognition  Cognition Overall Cognitive Status: Appears within functional limits for tasks assessed/performed Arousal/Alertness: Awake/alert Orientation Level: Appears intact for tasks assessed Behavior During Session: [facility] for tasks performed    Extremity/Trunk Assessment Right Upper Extremity Assessment RUE ROM/Strength/Tone: Within functional levels Left Upper Extremity Assessment LUE ROM/Strength/Tone: Within functional levels Right Lower Extremity Assessment RLE  ROM/Strength/Tone: Within functional levels RLE Sensation: WFL - Light Touch RLE Coordination: WFL -  gross/fine motor Left Lower Extremity Assessment LLE ROM/Strength/Tone: Within functional levels LLE Sensation: WFL - Light Touch LLE Coordination: WFL - gross/fine motor Trunk Assessment Trunk Assessment: Normal   Balance Balance Balance Assessed: Yes Static Sitting Balance Static Sitting - Balance Support: Feet supported;No upper extremity supported Static Sitting - Level of Assistance: 7: Independent Static Sitting - Comment/# of Minutes: 2  End of Session PT - End of Session Activity Tolerance: Patient limited by fatigue Patient left: in chair;with call bell/phone within reach Nurse Communication: Mobility status  GP     Ralene Bathe Kistler 03/28/2013, 1:34 PM [phone]

## 2013-03-28 NOTE — Progress Notes (Signed)
03/27/2013 22:24- HS CBG 327.  Glucometer not communicating with Epic, not documented in Lab section.  Berton Bon RN

## 2013-03-28 NOTE — Progress Notes (Signed)
Hypoglycemic Event  CBG: 45  Treatment: 1 tube glucose gel 15 gms  Symptoms: none  Follow-up CBG: Time: 0720 CBG Result: 69  Possible Reasons for Event: unknown  Comments/MD notified: giving another 4 oz orange juice, meal tray on the way    Morgan Morris D  Remember to initiate Hypoglycemia Order Set & complete

## 2013-03-28 NOTE — Progress Notes (Signed)
Hypoglycemic Event  CBG:57  Treatment:  8 oz orange juice  Symptoms:  asymptomatic  Follow-up CBG: Time:  0645 CBG Result:  45  Possible Reasons for Event: unknown  Comments/MD notified: note placed, will notify upon rounds    Yezenia Fredrick, Janine D  Remember to initiate Hypoglycemia Order Set & complete

## 2013-03-29 LAB — BASIC METABOLIC PANEL
CO2: 33 mEq/L — ABNORMAL HIGH (ref 19–32)
Calcium: 8.8 mg/dL (ref 8.4–10.5)
Creatinine, Ser: 1.17 mg/dL — ABNORMAL HIGH (ref 0.50–1.10)
GFR calc Af Amer: 46 mL/min — ABNORMAL LOW (ref >90)

## 2013-03-29 LAB — CBC
MCV: 94.4 fL (ref 78.0–100.0)
Platelets: 185 10*3/uL (ref 150–400)
RDW: 14.9 % (ref 11.5–15.5)
WBC: 8.5 10*3/uL (ref 4.0–10.5)

## 2013-03-29 LAB — GLUCOSE, CAPILLARY

## 2013-03-29 MED ORDER — POTASSIUM CHLORIDE CRYS ER 20 MEQ PO TBCR
40.0000 meq | EXTENDED_RELEASE_TABLET | Freq: Every day | ORAL | Status: DC
  Administered 2013-03-29 – 2013-04-01 (×4): 40 meq via ORAL
  Filled 2013-03-29 (×4): qty 2

## 2013-03-29 MED ORDER — SODIUM CHLORIDE 0.9 % IJ SOLN
10.0000 mL | INTRAMUSCULAR | Status: DC | PRN
  Administered 2013-03-29 – 2013-04-01 (×6): 10 mL

## 2013-03-29 MED ORDER — FUROSEMIDE 40 MG PO TABS
60.0000 mg | ORAL_TABLET | Freq: Once | ORAL | Status: AC
  Administered 2013-03-29: 60 mg via ORAL
  Filled 2013-03-29: qty 1

## 2013-03-29 NOTE — Progress Notes (Addendum)
[facility] PROGRESS NOTE  Morgan Morris ZHY:[id number] DOB: 1920-02-23 DOA: 03/27/2013 PCP: Juline Patch, MD  Brief narrative 79 yo female with diabetes, HTN, recent diagnosis and admission for atrial fibrillation with RVR at which time she also had PNA, now presenting to ED with main concern of progressively worsening shortness of breath that initially started several days prior to admission, initially with exertion and has progressed to dyspnea at rest. This has been associated with occasional episodes of non productive cough. Pt denies fevers, chills, no orthopnea or lower extremity swelling, no other systemic concerns, no chest pain. Pt denies dizziness, syncopal events, no specific focal neurological symptoms, no headaches or visual changes.   In ED, pt found to be in atrial fibrillation with RVR, rate in 120's. CXR with congestion and ? Atelectasis vs PNA.   Assessment/Plan: 1. Acute hypoxic respiratory failure: Secondary to decompensated CHF. Pneumonia seems less likely. Improving. Titrate oxygen as tolerated. 2. Acute diastolic CHF: Probably precipitated by rapid A. fib. Continue IV Lasix. Improving but still significantly volume overloaded/sacral 2+ pitting edema. Recent echo 02/24/13: EF 60-65%. Mild LV hypertrophy. Moderate pulmonary hypertension. No IV access-midline requested 3. Type 2 diabetes mellitus with hypoglycemia: Likely secondary to poor oral intake. Reduced insulins and monitor closely. Improved. No further hypoglycemic episodes. 4. Presumed UTI: Patient was on Rocephin and Macrobid on 4/6. Macrobid was discontinued by this M.D.. At some point Rocephin was switched to Cipro (without knowledge of this M.D.). Patient denies dysuria at any time. No fever or leukocytosis since ED. Will DC all antibiotics. Urine culture suggesting contamination. 5. A. fib with RVR: Rate controlled and anticoagulated. Continue metoprolol and Apixaban. 6. Anemia of chronic disease:  Stable. 7. Stage III chronic kidney disease: Creatinine stable. Follow BMP while on diuretics. 8. Possible dementia: MS probably baseline  9. Hypokalemia: Replete and follow   Code Status: Full Family Communication: Discussed with son, Mr Jeliyah Middlebrooks via phone. Disposition Plan: Home when stable.   Consultants:  None  Procedures:  Midline  Antibiotics:  IV Rocephin 4/5 >   HPI/Subjective: Patient is a poor historian/confused.  Indicates that her dyspnea is better. Per nursing,  difficult peripheral IV access.  Objective: Filed Vitals:   03/28/13 2137 03/28/13 2218 03/29/13 0614 03/29/13 0622  BP: 107/81 107/81 113/64   Pulse: 100 122 74   Temp:  97.7 F (36.5 C) 98.1 F (36.7 C)   TempSrc:  Oral Oral   Resp:  16 20   Height:      Weight:    66.3 kg (146 lb 2.6 oz)  SpO2:  99% 96%     Intake/Output Summary (Last 24 hours) at 03/29/13 1515 Last data filed at 03/29/13 1347  Gross per 24 hour  Intake    360 ml  Output   1001 ml  Net   -641 ml   Filed Weights   03/27/13 1501 03/28/13 0543 03/29/13 0622  Weight: 68.947 kg (152 lb) 68.7 kg (151 lb 7.3 oz) 66.3 kg (146 lb 2.6 oz)    Exam:   General exam: Comfortable. Lying supine in bed.  Respiratory system: Reduced breath sounds in the bases. Few basal crackles otherwise clear to auscultation. No increased work of breathing.  Cardiovascular system: S1 & S2 heard, irregular. No JVD, murmurs, gallops, clicks. 1+ pitting bilateral leg edema.2+ sacral pitting edema.  Telemetry: A. fib with CVR in 90s.  Gastrointestinal system: Abdomen is nondistended, soft and nontender. Normal bowel sounds heard.  Central nervous system: Alert and oriented  to self and place. No focal neurological deficits.  Extremities: Symmetric 5 x 5 power.   Data Reviewed: Basic Metabolic Panel:  Recent Labs Lab 03/27/13 1004 03/28/13 0519 03/29/13 0504  NA 138 137 140  K 3.9 3.6 3.2*  CL 101 99 100  CO2 27 31 33*  GLUCOSE  133* 57* 158*  BUN 28* 26* 22  CREATININE 1.21* 1.22* 1.17*  CALCIUM 9.0 8.8 8.8  MG 2.5  --   --   PHOS 4.2  --   --    Liver Function Tests:  Recent Labs Lab 03/27/13 1004 03/28/13 0519  AST 34 48*  ALT 37* 31  ALKPHOS 59 53  BILITOT 0.9 0.8  PROT 6.4 6.1  ALBUMIN 3.3* 3.0*   No results found for this basename: LIPASE, AMYLASE,  in the last 168 hours No results found for this basename: AMMONIA,  in the last 168 hours CBC:  Recent Labs Lab 03/27/13 1004 03/28/13 0519 03/29/13 0504  WBC 10.3 10.1 8.5  HGB 11.9* 10.9* 10.9*  HCT 36.2 33.3* 33.5*  MCV 94.8 94.9 94.4  PLT 193 206 185   Cardiac Enzymes:  Recent Labs Lab 03/27/13 1004  TROPONINI <0.30   BNP (last 3 results)  Recent Labs  03/27/13 1004  PROBNP 3476.0*   CBG:  Recent Labs Lab 03/28/13 1156 03/28/13 1708 03/28/13 2112 03/29/13 0748 03/29/13 1156  GLUCAP 211* 135* 224* 113* 149*    Recent Results (from the past 240 hour(s))  URINE CULTURE     Status: None   Collection Time    03/27/13  9:42 AM      Result Value Range Status   Specimen Description URINE, CLEAN CATCH   Final   Special Requests NONE   Final   Culture  Setup Time 03/27/2013 13:36   Final   Colony Count 75,000 COLONIES/ML   Final   Culture     Final   Value: Multiple bacterial morphotypes present, none predominant. Suggest appropriate recollection if clinically indicated.   Report Status 03/28/2013 FINAL   Final  MRSA PCR SCREENING     Status: None   Collection Time    03/27/13  2:27 PM      Result Value Range Status   MRSA by PCR NEGATIVE  NEGATIVE Final   Comment:            The GeneXpert MRSA Assay (FDA     approved for NASAL specimens     only), is one component of a     comprehensive MRSA colonization     surveillance program. It is not     intended to diagnose MRSA     infection nor to guide or     monitor treatment for     MRSA infections.     Studies: No results found.   Additional  labs:   Scheduled Meds: . apixaban  2.5 mg Oral BID  . B-complex with vitamin C  1 tablet Oral Daily  . cholecalciferol  1,000 Units Oral Daily  . ciprofloxacin  500 mg Oral Q1500  . escitalopram  10 mg Oral Daily  . furosemide  40 mg Intravenous Daily  . insulin aspart  0-9 Units Subcutaneous TID WC  . insulin glargine  10 Units Subcutaneous QHS  . latanoprost  1 drop Both Eyes QHS  . metoprolol tartrate  25 mg Oral BID  . potassium chloride  40 mEq Oral Daily  . QUEtiapine  12.5 mg Oral Q48H  . simvastatin  20 mg Oral Q supper  . sodium chloride  3 mL Intravenous Q12H  . timolol  1 drop Both Eyes BID   Continuous Infusions:    Principal Problem:   Acute respiratory failure with hypoxia Active Problems:   Anemia   Atrial fibrillation   Shortness of breath   CKD (chronic kidney disease), stage III   UTI (lower urinary tract infection)   Acute diastolic CHF (congestive heart failure)   Hypoglycemia associated with diabetes    Time spent: 40 minutes    [facility]  [facility] Pager [phone].   If 8PM-8AM, please contact night-coverage at [email or web address], password [facility] 03/29/2013, 3:15 PM  LOS: 2 days

## 2013-03-29 NOTE — Progress Notes (Signed)
Peripherally Inserted Central Catheter/Midline Placement  The IV Nurse has discussed with the patient and/or persons authorized to consent for the patient, the purpose of this procedure and the potential benefits and risks involved with this procedure.  The benefits include less needle sticks, lab draws from the catheter and patient may be discharged home with the catheter.  Risks include, but not limited to, infection, bleeding, blood clot (thrombus formation), and puncture of an artery; nerve damage and irregular heat beat.  Alternatives to this procedure were also discussed.  PICC/Midline Placement Documentation        Lisabeth Devoid 03/29/2013, 12:42 PM Consent obtained by Lazarus Gowda, RN

## 2013-03-29 NOTE — Progress Notes (Signed)
Clinical Social Work Department BRIEF PSYCHOSOCIAL ASSESSMENT 03/29/2013  Patient:  Morgan Morris, Morgan Morris     Account Number:  [id number]     Admit date:  03/27/2013  Clinical Social Worker:  Hattie Perch  Date/Time:  03/29/2013 12:00 M  Referred by:  Physician  Date Referred:  03/29/2013 Referred for  ALF Placement   Other Referral:   Interview type:  Patient Other interview type:    PSYCHOSOCIAL DATA Living Status:  FACILITY Admitted from facility:  [facility] Level of care:  Assisted Living Primary support name:  Morgan Morris Primary support relationship to patient:  CHILD, ADULT Degree of support available:   good    CURRENT CONCERNS Current Concerns  Post-Acute Placement   Other Concerns:    SOCIAL WORK ASSESSMENT / PLAN CSW met with patient. patient is alert and oriented X3 but very hard of hearing. patient confirms that she is a resident of [facility] assisted living and will return there upon discharge.   Assessment/plan status:   Other assessment/ plan:   Information/referral to community resources:    PATIENT'S/FAMILY'S RESPONSE TO PLAN OF CARE: agreeable to return to [facility] assisted living when medically stable.

## 2013-03-29 NOTE — Care Management Note (Addendum)
    Page 1 of 2   04/01/2013     2:00:16 PM   CARE MANAGEMENT NOTE 04/01/2013  Patient:  MALEE, GRAYS   Account Number:  [id number]  Date Initiated:  03/27/2013  Documentation initiated by:  [facility]  Subjective/Objective Assessment:   ADMITTED W/SOB.RESP FAILURE.READMIT 3/5-3/8-AFIB     Action/Plan:   FROM [facility]-ALF   Anticipated DC Date:  04/01/2013   Anticipated DC Plan:  HOME W HOME HEALTH SERVICES      DC Planning Services  CM consult      Choice offered to / List presented to:  C-1 Patient        HH arranged  HH-1 RN  HH-2 PT      [facility] agency  [facility]   Status of service:  Completed, signed off Medicare Important Message given?   (If response is "NO", the following Medicare IM given date fields will be blank) Date Medicare IM given:   Date Additional Medicare IM given:    Discharge Disposition:  ASSISTED LIVING  Per UR Regulation:  Reviewed for med. necessity/level of care/duration of stay  If discussed at Long Length of Stay Meetings, dates discussed:    Comments:  04/01/13 Lavern Maslow RN,BSN NCM [phone] [facility](REP) AWARE OF HH ORDERS,& D/C.D/C PLAN RETURN ALF-[facility].  03/31/13 Jazmene Racz RN,BSN NCM [phone] [facility],NEBS.PT-HH.D/C PLAN RESUMPTION OF HHRN/PT & RETURN ALF-[facility](REP) FOLLOWING.  03/28/13 Johnross Nabozny RN,BSN NCM [phone] RECEIVED CALL FROM [facility](REP)ACTIVE W/HHRN/PT.PT-HH.IF MD AGREE RESUMPTION OF HHRN/PT @ D/C.

## 2013-03-30 LAB — BASIC METABOLIC PANEL
BUN: 22 mg/dL (ref 6–23)
CO2: 34 mEq/L — ABNORMAL HIGH (ref 19–32)
Calcium: 9 mg/dL (ref 8.4–10.5)
Chloride: 99 mEq/L (ref 96–112)
Creatinine, Ser: 1.24 mg/dL — ABNORMAL HIGH (ref 0.50–1.10)
GFR calc Af Amer: 43 mL/min — ABNORMAL LOW (ref >90)
GFR calc non Af Amer: 37 mL/min — ABNORMAL LOW (ref >90)
Glucose, Bld: 165 mg/dL — ABNORMAL HIGH (ref 70–99)
Potassium: 3.4 mEq/L — ABNORMAL LOW (ref 3.5–5.1)
Sodium: 137 mEq/L (ref 135–145)

## 2013-03-30 LAB — GLUCOSE, CAPILLARY
Glucose-Capillary: 122 mg/dL — ABNORMAL HIGH (ref 70–99)
Glucose-Capillary: 187 mg/dL — ABNORMAL HIGH (ref 70–99)
Glucose-Capillary: 244 mg/dL — ABNORMAL HIGH (ref 70–99)
Glucose-Capillary: 229 mg/dL — ABNORMAL HIGH (ref 70–99)

## 2013-03-30 MED ORDER — POTASSIUM CHLORIDE CRYS ER 20 MEQ PO TBCR
40.0000 meq | EXTENDED_RELEASE_TABLET | Freq: Once | ORAL | Status: AC
  Administered 2013-03-30: 40 meq via ORAL
  Filled 2013-03-30 (×2): qty 2

## 2013-03-30 MED ORDER — INSULIN ASPART 100 UNIT/ML ~~LOC~~ SOLN
3.0000 [IU] | Freq: Three times a day (TID) | SUBCUTANEOUS | Status: DC
  Administered 2013-03-31 – 2013-04-01 (×5): 3 [IU] via SUBCUTANEOUS

## 2013-03-30 NOTE — Progress Notes (Signed)
[facility] PROGRESS NOTE  Morgan Morris WNU:[id number] DOB: 23-Jun-1920 DOA: 03/27/2013 PCP: Juline Patch, MD  Brief narrative 79 yo female with diabetes, HTN, recent diagnosis and admission for atrial fibrillation with RVR at which time she also had PNA, now presenting to ED with main concern of progressively worsening shortness of breath that initially started several days prior to admission, initially with exertion and has progressed to dyspnea at rest. This has been associated with occasional episodes of non productive cough. Pt denies fevers, chills, no orthopnea or lower extremity swelling, no other systemic concerns, no chest pain. Pt denies dizziness, syncopal events, no specific focal neurological symptoms, no headaches or visual changes.   In ED, pt found to be in atrial fibrillation with RVR, rate in 120's. CXR with congestion and ? Atelectasis vs PNA.   Assessment/Plan: 1. Acute hypoxic respiratory failure: Secondary to decompensated CHF. Pneumonia seems less likely. Resolved. 2. Acute diastolic CHF: Probably precipitated by rapid A. fib. Continue IV Lasix. Improving but still significantly volume overloaded/sacral 2+ pitting edema. Recent echo 02/24/13: EF 60-65%. Mild LV hypertrophy. Moderate pulmonary hypertension. No IV access-midline requested 3. Type 2 diabetes mellitus with hypoglycemia: Likely secondary to poor oral intake. Reduced insulins and monitor closely. Improved. No further hypoglycemic episodes. Has history of recurrent hypoglycemia. Will add mealtime NovoLog. 4. Presumed UTI: Patient was on Rocephin and Macrobid on 4/6. Macrobid was discontinued by this M.D.. At some point Rocephin was switched to Cipro (without knowledge of this M.D.). Patient denies dysuria at any time. No fever or leukocytosis since ED. Will DC all antibiotics. Urine culture suggesting contamination. 5. A. fib with RVR: Rate controlled and anticoagulated. Continue metoprolol and  Apixaban. 6. Anemia of chronic disease: Stable. 7. Stage III chronic kidney disease: Creatinine stable. Follow BMP while on diuretics. 8. Possible dementia: MS probably baseline  9. Hypokalemia: Replete and follow   Code Status: Full Family Communication: Discussed with family at bedside Disposition Plan: Home when stable.   Consultants:  None  Procedures:  Midline  Antibiotics:  IV Rocephin 4/5 > DC'd  HPI/Subjective: Patient is a poor historian/confused.  Indicates that her dyspnea is better.   Objective: Filed Vitals:   03/29/13 0622 03/29/13 2100 03/30/13 0601 03/30/13 1327  BP:  126/76 129/77 121/80  Pulse:  87 84 79  Temp:  97.4 F (36.3 C) 97.8 F (36.6 C) 98.4 F (36.9 C)  TempSrc:  Oral Oral Oral  Resp:  20 20 17   Height:      Weight: 66.3 kg (146 lb 2.6 oz)  64.2 kg (141 lb 8.6 oz)   SpO2:  96% 97% 99%    Intake/Output Summary (Last 24 hours) at 03/30/13 1830 Last data filed at 03/30/13 1700  Gross per 24 hour  Intake    360 ml  Output   1000 ml  Net   -640 ml   Filed Weights   03/28/13 0543 03/29/13 0622 03/30/13 0601  Weight: 68.7 kg (151 lb 7.3 oz) 66.3 kg (146 lb 2.6 oz) 64.2 kg (141 lb 8.6 oz)    Exam:   General exam: Comfortable.   Respiratory system: Reduced breath sounds in the bases-improving. Few basal crackles otherwise clear to auscultation. No increased work of breathing.  Cardiovascular system: S1 & S2 heard, irregular. No JVD, murmurs, gallops, clicks. 1+ pitting bilateral leg edema.2+ sacral pitting edema.    Gastrointestinal system: Abdomen is nondistended, soft and nontender. Normal bowel sounds heard.  Central nervous system: Alert and oriented to self and  place. No focal neurological deficits.  Extremities: Symmetric 5 x 5 power.   Data Reviewed: Basic Metabolic Panel:  Recent Labs Lab 03/27/13 1004 03/28/13 0519 03/29/13 0504 03/30/13 0500  NA 138 137 140 137  K 3.9 3.6 3.2* 3.4*  CL 101 99 100 99  CO2  27 31 33* 34*  GLUCOSE 133* 57* 158* 165*  BUN 28* 26* 22 22  CREATININE 1.21* 1.22* 1.17* 1.24*  CALCIUM 9.0 8.8 8.8 9.0  MG 2.5  --   --   --   PHOS 4.2  --   --   --    Liver Function Tests:  Recent Labs Lab 03/27/13 1004 03/28/13 0519  AST 34 48*  ALT 37* 31  ALKPHOS 59 53  BILITOT 0.9 0.8  PROT 6.4 6.1  ALBUMIN 3.3* 3.0*   No results found for this basename: LIPASE, AMYLASE,  in the last 168 hours No results found for this basename: AMMONIA,  in the last 168 hours CBC:  Recent Labs Lab 03/27/13 1004 03/28/13 0519 03/29/13 0504  WBC 10.3 10.1 8.5  HGB 11.9* 10.9* 10.9*  HCT 36.2 33.3* 33.5*  MCV 94.8 94.9 94.4  PLT 193 206 185   Cardiac Enzymes:  Recent Labs Lab 03/27/13 1004  TROPONINI <0.30   BNP (last 3 results)  Recent Labs  03/27/13 1004  PROBNP 3476.0*   CBG:  Recent Labs Lab 03/29/13 1711 03/29/13 2211 03/30/13 0751 03/30/13 1126 03/30/13 1633  GLUCAP 213* 254* 122* 187* 244*    Recent Results (from the past 240 hour(s))  URINE CULTURE     Status: None   Collection Time    03/27/13  9:42 AM      Result Value Range Status   Specimen Description URINE, CLEAN CATCH   Final   Special Requests NONE   Final   Culture  Setup Time 03/27/2013 13:36   Final   Colony Count 75,000 COLONIES/ML   Final   Culture     Final   Value: Multiple bacterial morphotypes present, none predominant. Suggest appropriate recollection if clinically indicated.   Report Status 03/28/2013 FINAL   Final  MRSA PCR SCREENING     Status: None   Collection Time    03/27/13  2:27 PM      Result Value Range Status   MRSA by PCR NEGATIVE  NEGATIVE Final   Comment:            The GeneXpert MRSA Assay (FDA     approved for NASAL specimens     only), is one component of a     comprehensive MRSA colonization     surveillance program. It is not     intended to diagnose MRSA     infection nor to guide or     monitor treatment for     MRSA infections.      Studies: No results found.   Additional labs:   Scheduled Meds: . apixaban  2.5 mg Oral BID  . B-complex with vitamin C  1 tablet Oral Daily  . cholecalciferol  1,000 Units Oral Daily  . escitalopram  10 mg Oral Daily  . furosemide  40 mg Intravenous Daily  . insulin aspart  0-9 Units Subcutaneous TID WC  . insulin glargine  10 Units Subcutaneous QHS  . latanoprost  1 drop Both Eyes QHS  . metoprolol tartrate  25 mg Oral BID  . potassium chloride  40 mEq Oral Daily  . QUEtiapine  12.5 mg  Oral Q48H  . simvastatin  20 mg Oral Q supper  . sodium chloride  3 mL Intravenous Q12H  . timolol  1 drop Both Eyes BID   Continuous Infusions:    Principal Problem:   Acute respiratory failure with hypoxia Active Problems:   Anemia   Atrial fibrillation   Shortness of breath   CKD (chronic kidney disease), stage III   UTI (lower urinary tract infection)   Acute diastolic CHF (congestive heart failure)   Hypoglycemia associated with diabetes    Time spent: 30 minutes    [facility]  [facility] Pager [phone].   If 8PM-8AM, please contact night-coverage at [email or web address], password [facility] 03/30/2013, 6:30 PM  LOS: 3 days

## 2013-03-30 NOTE — Progress Notes (Signed)
Stopped to see patient today. She had seen my service dog and wanted to meet [place].  We had a nice visit. She seemed to be appreciative. Offered encouragement for her continued healing and well-being. Will stop to see her on Thursday if she is still here in the hospital.

## 2013-03-30 NOTE — Progress Notes (Signed)
Inpatient Diabetes Program Recommendations  AACE/ADA: New Consensus Statement on Inpatient Glycemic Control (2013)  Target Ranges:  Prepandial:   less than 140 mg/dL      Peak postprandial:   less than 180 mg/dL (1-2 hours)      Critically ill patients:  140 - 180 mg/dL   Reason for Visit: Hyperglycemia Results for Morgan Morris, Morgan Morris (MRN [id number]) as of 03/30/2013 10:23  Ref. Range 03/29/2013 11:56 03/29/2013 17:11 03/29/2013 22:11 03/30/2013 07:51  Glucose-Capillary Latest Range: 70-99 mg/dL 782 (H) 956 (H) 213 (H) 122 (H)   Needs meal coverage insulin.  FBS in am look ok.  Recommendations:  Add Novolog 3 units tidwc for meal coverage insulin if pt eats >50% meal.  Thank you. Ailene Ards, RD, LDN, CDE Inpatient Diabetes Coordinator [phone]

## 2013-03-30 NOTE — Progress Notes (Signed)
Physical Therapy Treatment Patient Details Name: Morgan Morris MRN: [id number] DOB: 30-Apr-1920 Today's Date: 03/30/2013 Time: 2952-8413 PT Time Calculation (min): 29 min  PT Assessment / Plan / Recommendation Comments on Treatment Session  Pt ambulated in hallway and performed exercises in chair.      Follow Up Recommendations  Home health PT;Supervision - Intermittent     Does the patient have the potential to tolerate intense rehabilitation     Barriers to Discharge        Equipment Recommendations  None recommended by PT    Recommendations for Other Services    Frequency     Plan Discharge plan remains appropriate;Frequency remains appropriate    Precautions / Restrictions Precautions Precautions: None   Pertinent Vitals/Pain See below, no pain    Mobility  Bed Mobility Bed Mobility: Supine to Sit Supine to Sit: 5: Supervision Transfers Transfers: Sit to Stand;Stand to Sit Sit to Stand: 5: Supervision Stand to Sit: 5: Supervision Details for Transfer Assistance: supervision for safety Ambulation/Gait Ambulation/Gait Assistance: 5: Supervision Ambulation Distance (Feet): 400 Feet Assistive device: Rolling walker Ambulation/Gait Assistance Details: verbal cues for safe use of RW, pt uncertain when questioned about SOB ("I don't know, maybe I am") SaO2 95% room air upon return to room after ambulation Gait Pattern: Within Functional Limits    Exercises General Exercises - Lower Extremity Ankle Circles/Pumps: AROM;Both;20 reps Short Arc Quad: AROM;Both;20 reps Long Arc Quad: AROM;Both;20 reps;Seated Hip ABduction/ADduction: AROM;Both;20 reps Straight Leg Raises: AROM;Both;20 reps Hip Flexion/Marching: AROM;Both;20 reps;Seated   PT Diagnosis:    PT Problem List:   PT Treatment Interventions:     PT Goals Acute Rehab PT Goals PT Goal: Supine/Side to Sit - Progress: Progressing toward goal PT Goal: Sit to Stand - Progress: Progressing toward goal PT Goal:  Ambulate - Progress: Progressing toward goal PT Goal: Perform Home Exercise Program - Progress: Progressing toward goal  Visit Information  Last PT Received On: 03/30/13 Assistance Needed: +1    Subjective Data  Subjective: I'm really tired today.  I slept like a log though.   Cognition  Cognition Overall Cognitive Status: Appears within functional limits for tasks assessed/performed Arousal/Alertness: Awake/alert Orientation Level: Appears intact for tasks assessed Behavior During Session: [facility] for tasks performed    Balance     End of Session PT - End of Session Activity Tolerance: Patient tolerated treatment well Patient left: in chair;with call bell/phone within reach;with nursing in room;with chair alarm set;with family/visitor present   GP     Rumaisa Schnetzer,KATHrine E 03/30/2013, 10:48 AM Zenovia Jarred, PT, DPT 03/30/2013 Pager: [phone]

## 2013-03-31 DIAGNOSIS — J9600 Acute respiratory failure, unspecified whether with hypoxia or hypercapnia: Secondary | ICD-10-CM

## 2013-03-31 DIAGNOSIS — E119 Type 2 diabetes mellitus without complications: Secondary | ICD-10-CM

## 2013-03-31 DIAGNOSIS — I4891 Unspecified atrial fibrillation: Secondary | ICD-10-CM

## 2013-03-31 LAB — GLUCOSE, CAPILLARY
Glucose-Capillary: 168 mg/dL — ABNORMAL HIGH (ref 70–99)
Glucose-Capillary: 213 mg/dL — ABNORMAL HIGH (ref 70–99)

## 2013-03-31 LAB — PRO B NATRIURETIC PEPTIDE: Pro B Natriuretic peptide (BNP): 2796 pg/mL — ABNORMAL HIGH (ref 0–450)

## 2013-03-31 LAB — BASIC METABOLIC PANEL
CO2: 33 mEq/L — ABNORMAL HIGH (ref 19–32)
Calcium: 8.8 mg/dL (ref 8.4–10.5)
Chloride: 98 mEq/L (ref 96–112)
Creatinine, Ser: 1.21 mg/dL — ABNORMAL HIGH (ref 0.50–1.10)
GFR calc Af Amer: 44 mL/min — ABNORMAL LOW (ref >90)
Sodium: 137 mEq/L (ref 135–145)

## 2013-03-31 MED ORDER — INSULIN GLARGINE 100 UNIT/ML ~~LOC~~ SOLN
15.0000 [IU] | Freq: Every day | SUBCUTANEOUS | Status: DC
  Administered 2013-03-31: 15 [IU] via SUBCUTANEOUS
  Filled 2013-03-31 (×2): qty 0.15

## 2013-03-31 MED ORDER — FUROSEMIDE 40 MG PO TABS
40.0000 mg | ORAL_TABLET | Freq: Two times a day (BID) | ORAL | Status: DC
  Administered 2013-04-01: 40 mg via ORAL
  Filled 2013-03-31 (×3): qty 1

## 2013-03-31 NOTE — Progress Notes (Signed)
[facility] PROGRESS NOTE  Morgan Morris ZOX:[id number] DOB: 1920-09-27 DOA: 03/27/2013 PCP: Juline Patch, MD  Brief narrative 79 yo female with diabetes, HTN, recent diagnosis and admission for atrial fibrillation with RVR at which time she also had PNA, now presenting to ED with main concern of progressively worsening shortness of breath that initially started several days prior to admission, initially with exertion and has progressed to dyspnea at rest. This has been associated with occasional episodes of non productive cough. Pt denies fevers, chills, no orthopnea or lower extremity swelling, no other systemic concerns, no chest pain. Pt denies dizziness, syncopal events, no specific focal neurological symptoms, no headaches or visual changes.   In ED, pt found to be in atrial fibrillation with RVR, rate in 120's. CXR with congestion and ? Atelectasis vs PNA.   Assessment/Plan: 1. Acute hypoxic respiratory failure: Secondary to decompensated CHF. Pneumonia seems less likely. Resolved.  2. Acute diastolic CHF: Probably precipitated by rapid A. Fib. - Improving - wt down 11lbs and 5.1L negative - change to PO lasix from 4/10 - Recent echo 02/24/13: EF 60-65%. Mild LV hypertrophy. Moderate pulmonary hypertension.  3. Type 2 diabetes mellitus with hypoglycemia: - initially hypoglycemic, now CBGs up will increase lantus, SSI  4. Presumed UTI: cx negative, abx stopped  5. A. fib with RVR: - Rate controlled and anticoagulated.  -Continue metoprolol and Apixaban.  6. Anemia of chronic disease: Stable.  7. Stage III chronic kidney disease: Creatinine stable. Follow BMP while on diuretics.  8. Possible dementia: this was noted on previous hospitalizations too  9. Hypokalemia: Replete and follow   Code Status: Full Family Communication: Discussed with family at bedside Disposition Plan: Home when stable.   Consultants:  None  Procedures:  Midline  Antibiotics:  IV  Rocephin 4/5 > DC'd  HPI/Subjective: dyspnea is better, getting weaker laying in bed.   Objective: Filed Vitals:   03/30/13 0601 03/30/13 1327 03/30/13 2034 03/31/13 0526  BP: 129/77 121/80 103/73 122/83  Pulse: 84 79 70 63  Temp: 97.8 F (36.6 C) 98.4 F (36.9 C) 98 F (36.7 C) 97.8 F (36.6 C)  TempSrc: Oral Oral Oral Oral  Resp: 20 17 18 18   Height:      Weight: 64.2 kg (141 lb 8.6 oz)   64 kg (141 lb 1.5 oz)  SpO2: 97% 99% 98% 97%    Intake/Output Summary (Last 24 hours) at 03/31/13 1305 Last data filed at 03/31/13 1244  Gross per 24 hour  Intake    240 ml  Output   1300 ml  Net  -1060 ml   Filed Weights   03/29/13 0622 03/30/13 0601 03/31/13 0526  Weight: 66.3 kg (146 lb 2.6 oz) 64.2 kg (141 lb 8.6 oz) 64 kg (141 lb 1.5 oz)    Exam:   General exam: Comfortable, oriented    Respiratory system: Reduced breath sounds in the bases No increased work of breathing.  Cardiovascular system: S1 & S2 heard, irregular. No JVD, murmurs, gallops, clicks. 1+ pitting bilateral leg edema.2+ sacral pitting edema.    Gastrointestinal system: Abdomen is nondistended, soft and nontender. Normal bowel sounds heard.  Central nervous system: Alert and oriented to self and place. No focal neurological deficits.  Extremities: Symmetric 5 x 5 power.   Data Reviewed: Basic Metabolic Panel:  Recent Labs Lab 03/27/13 1004 03/28/13 0519 03/29/13 0504 03/30/13 0500 03/31/13 0425  NA 138 137 140 137 137  K 3.9 3.6 3.2* 3.4* 3.9  CL 101 99  100 99 98  CO2 27 31 33* 34* 33*  GLUCOSE 133* 57* 158* 165* 188*  BUN 28* 26* 22 22 24*  CREATININE 1.21* 1.22* 1.17* 1.24* 1.21*  CALCIUM 9.0 8.8 8.8 9.0 8.8  MG 2.5  --   --   --   --   PHOS 4.2  --   --   --   --    Liver Function Tests:  Recent Labs Lab 03/27/13 1004 03/28/13 0519  AST 34 48*  ALT 37* 31  ALKPHOS 59 53  BILITOT 0.9 0.8  PROT 6.4 6.1  ALBUMIN 3.3* 3.0*   No results found for this basename: LIPASE, AMYLASE,   in the last 168 hours No results found for this basename: AMMONIA,  in the last 168 hours CBC:  Recent Labs Lab 03/27/13 1004 03/28/13 0519 03/29/13 0504  WBC 10.3 10.1 8.5  HGB 11.9* 10.9* 10.9*  HCT 36.2 33.3* 33.5*  MCV 94.8 94.9 94.4  PLT 193 206 185   Cardiac Enzymes:  Recent Labs Lab 03/27/13 1004  TROPONINI <0.30   BNP (last 3 results)  Recent Labs  03/27/13 1004 03/31/13 0425  PROBNP 3476.0* 2796.0*   CBG:  Recent Labs Lab 03/29/13 2211 03/30/13 0751 03/30/13 1126 03/30/13 1633 03/30/13 2155  GLUCAP 254* 122* 187* 244* 229*    Recent Results (from the past 240 hour(s))  URINE CULTURE     Status: None   Collection Time    03/27/13  9:42 AM      Result Value Range Status   Specimen Description URINE, CLEAN CATCH   Final   Special Requests NONE   Final   Culture  Setup Time 03/27/2013 13:36   Final   Colony Count 75,000 COLONIES/ML   Final   Culture     Final   Value: Multiple bacterial morphotypes present, none predominant. Suggest appropriate recollection if clinically indicated.   Report Status 03/28/2013 FINAL   Final  MRSA PCR SCREENING     Status: None   Collection Time    03/27/13  2:27 PM      Result Value Range Status   MRSA by PCR NEGATIVE  NEGATIVE Final   Comment:            The GeneXpert MRSA Assay (FDA     approved for NASAL specimens     only), is one component of a     comprehensive MRSA colonization     surveillance program. It is not     intended to diagnose MRSA     infection nor to guide or     monitor treatment for     MRSA infections.     Studies: No results found.   Additional labs:   Scheduled Meds: . apixaban  2.5 mg Oral BID  . B-complex with vitamin C  1 tablet Oral Daily  . cholecalciferol  1,000 Units Oral Daily  . escitalopram  10 mg Oral Daily  . [START ON 04/01/2013] furosemide  40 mg Oral BID  . insulin aspart  0-9 Units Subcutaneous TID WC  . insulin aspart  3 Units Subcutaneous TID WC  .  insulin glargine  10 Units Subcutaneous QHS  . latanoprost  1 drop Both Eyes QHS  . metoprolol tartrate  25 mg Oral BID  . potassium chloride  40 mEq Oral Daily  . QUEtiapine  12.5 mg Oral Q48H  . simvastatin  20 mg Oral Q supper  . sodium chloride  3 mL Intravenous  Q12H  . timolol  1 drop Both Eyes BID   Continuous Infusions:    Principal Problem:   Acute respiratory failure with hypoxia Active Problems:   Anemia   Atrial fibrillation   Shortness of breath   CKD (chronic kidney disease), stage III   UTI (lower urinary tract infection)   Acute diastolic CHF (congestive heart failure)   Hypoglycemia associated with diabetes    Time spent: 30 minutes    Garlen Reinig  [facility] Pager [phone]  If 8PM-8AM, please contact night-coverage at [email or web address], password [facility] 03/31/2013, 1:05 PM  LOS: 4 days

## 2013-03-31 NOTE — Care Management (Signed)
This is an active pt with [facility].  Please write orders for [facility] and HHPT to be resumed at discharge. Also Pt is a Medicare pt and will need Face to Face completed at discharge  Thank You Venia Minks   [phone]

## 2013-04-01 DIAGNOSIS — D649 Anemia, unspecified: Secondary | ICD-10-CM

## 2013-04-01 DIAGNOSIS — I5031 Acute diastolic (congestive) heart failure: Secondary | ICD-10-CM

## 2013-04-01 DIAGNOSIS — J9600 Acute respiratory failure, unspecified whether with hypoxia or hypercapnia: Secondary | ICD-10-CM

## 2013-04-01 LAB — BASIC METABOLIC PANEL
BUN: 20 mg/dL (ref 6–23)
CO2: 33 mEq/L — ABNORMAL HIGH (ref 19–32)
Chloride: 98 mEq/L (ref 96–112)
Creatinine, Ser: 1.18 mg/dL — ABNORMAL HIGH (ref 0.50–1.10)
GFR calc Af Amer: 46 mL/min — ABNORMAL LOW (ref >90)
Glucose, Bld: 95 mg/dL (ref 70–99)
Potassium: 3.8 mEq/L (ref 3.5–5.1)

## 2013-04-01 LAB — CBC
HCT: 34.9 % — ABNORMAL LOW (ref 36.0–46.0)
Hemoglobin: 11.5 g/dL — ABNORMAL LOW (ref 12.0–15.0)
MCV: 93.8 fL (ref 78.0–100.0)
RDW: 14.6 % (ref 11.5–15.5)
WBC: 10 10*3/uL (ref 4.0–10.5)

## 2013-04-01 MED ORDER — FUROSEMIDE 40 MG PO TABS: 40.0000 mg | ORAL_TABLET | Freq: Every day | ORAL | Status: DC

## 2013-04-01 MED ORDER — POTASSIUM CHLORIDE CRYS ER 20 MEQ PO TBCR: 40.0000 meq | EXTENDED_RELEASE_TABLET | Freq: Every day | ORAL | Status: AC

## 2013-04-01 MED ORDER — INSULIN GLARGINE 100 UNIT/ML ~~LOC~~ SOLN: 20.0000 [IU] | Freq: Every day | SUBCUTANEOUS | Status: DC

## 2013-04-01 MED ORDER — INSULIN REGULAR HUMAN 100 UNIT/ML IJ SOLN: 4.0000 [IU] | Freq: Three times a day (TID) | INTRAMUSCULAR | Status: DC

## 2013-04-01 NOTE — Progress Notes (Signed)
Per MD order, Midline removed. Cath intact at 12cm. Vaseline pressure gauze to site, pressure held x . No bleeding to site. Pt instructed to keep dressing CDI x 24 hours. Avoid heavy lifting, pushing or pulling x 24 hours,  If bleeding occurs hold pressure, if bleeding does not stop contact MD or go to the ED. Pt does not have any questions. Morgan Morris

## 2013-04-01 NOTE — Progress Notes (Signed)
Patient cleared for discharge. Packet copied and placed in [place]. Patient family to drive.  Behr Cislo C. Tion Tse MSW, LCSW [phone]

## 2013-04-01 NOTE — Progress Notes (Signed)
Physical Therapy Treatment (late entry) Patient Details Name: Morgan Morris MRN: [id number] DOB: 1920-06-10 Today's Date: 04/01/2013 Time: 4098-1191 PT Time Calculation (min): 24 min  PT Assessment / Plan / Recommendation Comments on Treatment Session  Assisted pt OOB to amb in hallway then assist to BR due to loose stools.  Pt progressing well and moving well for 79yo. Pt plans to D/C to home.    Follow Up Recommendations  Home health PT;Supervision - Intermittent     Does the patient have the potential to tolerate intense rehabilitation     Barriers to Discharge        Equipment Recommendations  None recommended by PT    Recommendations for Other Services    Frequency Min 3X/week   Plan Discharge plan remains appropriate;Frequency remains appropriate    Precautions / Restrictions     Pertinent Vitals/Pain No c/o pain    Mobility  Bed Mobility Bed Mobility: Supine to Sit Supine to Sit: 5: Supervision Details for Bed Mobility Assistance: [facility] Transfers Transfers: Sit to Stand;Stand to Sit Sit to Stand: 5: Supervision Stand to Sit: 5: Supervision Details for Transfer Assistance: [facility] Ambulation/Gait Ambulation/Gait Assistance: 5: Supervision Ambulation Distance (Feet): 385 Feet Assistive device: Rolling walker Ambulation/Gait Assistance Details: good alternating gait and good safety cognition. Gait Pattern: Within Functional Limits Gait velocity: WFL General Gait Details: mild SOB noted RA 98%     PT Goals                                                       progressing    Visit Information  Last PT Received On: 04/01/13 Assistance Needed: +1    Subjective Data      Cognition    good   Balance   good  End of Session PT - End of Session Equipment Utilized During Treatment: Gait belt Activity Tolerance: Patient tolerated treatment well   Felecia Shelling  PTA [facility]  Acute  Rehab Pager      [phone]

## 2013-04-01 NOTE — Discharge Summary (Signed)
Physician Discharge Summary  Morgan Morris WUJ:[id number] DOB: 02-12-1920 DOA: 03/27/2013  PCP: Juline Patch, MD  Admit date: 03/27/2013 Discharge date: 04/01/2013  Time spent: 45 minutes  Recommendations for Outpatient Follow-up:  1. PCP in 1 week 2. Bmet in 1 week  Discharge Diagnoses:    Acute respiratory failure with hypoxia   Acute diastolic CHF (congestive heart failure)   Anemia   P Atrial fibrillation on Apixaban   Shortness of breath   CKD (chronic kidney disease), stage III   UTI (lower urinary tract infection)   DM 2 on Insulin   Suspected Early Dementia  Discharge Condition: Improved  Diet recommendation: Low Sodium  Filed Weights   03/30/13 0601 03/31/13 0526 04/01/13 0500  Weight: 64.2 kg (141 lb 8.6 oz) 64 kg (141 lb 1.5 oz) 62.8 kg (138 lb 7.2 oz)    History of present illness:   Pt is 79 yo female with diabetes, HTN, recent diagnosis and admission for atrial fibrillation with RVR at which time she also had PNA, now presenting to ED with main concern of progressively worsening shortness of breath that initially started several days prior to admission, initially with exertion and has progressed to dyspnea at rest. This has been associated with occasional episodes of non productive cough. Pt denies fevers, chills, no orthopnea or lower extremity swelling, no other systemic concerns, no chest pain. Pt denies dizziness, syncopal events, no specific focal neurological symptoms, no headaches or visual changes.  In ED, pt found to be in atrial fibrillation with RVR, rate in 120's. CXR with interstitial edema vs pneumonia   Hospital Course:  1. Acute hypoxic respiratory failure: Secondary to decompensated CHF. Pneumonia seems less likely. Resolved.  2. Acute diastolic CHF: Probably precipitated by rapid A. Fib. - Improved with diuresis using IV lasix - wt down 14lbs and 6L negative  - changed to PO lasix from 4/10  - continue Lasix 40mg  daily, check Bmet in 1 week -  Recent echo 02/24/13: EF 60-65%. Mild LV hypertrophy. Moderate pulmonary hypertension.   3. Type 2 diabetes mellitus with hypoglycemia: - initially hypoglycemic, then CBGs up will continue lantus and sliding scale insulin   4. Presumed UTI: Urine cultures negative, antibiotics stopped after 2 days  5. A. fib with RVR: - Rate controlled and anticoagulated.  -Continue metoprolol and Apixaban.   6. Anemia of chronic disease: Stable.  7. Stage III chronic kidney disease: Creatinine stable. Follow BMP while on diuretics.  8. Possible dementia: this was noted on previous hospitalizations too  9. Hypokalemia: Repleted and corrected     Discharge Exam: Filed Vitals:   04/01/13 0500 04/01/13 0516 04/01/13 0909 04/01/13 0952  BP:  115/82 108/61 108/61  Pulse:  104 86 86  Temp:  98 F (36.7 C) 97.9 F (36.6 C)   TempSrc:  Oral Oral   Resp:  16 18   Height:      Weight: 62.8 kg (138 lb 7.2 oz)     SpO2:  98% 98%     General: AAOx3 Cardiovascular: S1S2/RRR Respiratory: CTAB  Discharge Instructions      Discharge Orders   Future Orders Complete By Expires     Diet - low sodium heart healthy  As directed     Increase activity slowly  As directed         Medication List    TAKE these medications       albuterol 108 (90 BASE) MCG/ACT inhaler  Commonly known as:  PROVENTIL HFA;VENTOLIN HFA  Inhale 2 puffs into the lungs every 6 (six) hours as needed for wheezing.     apixaban 2.5 MG Tabs tablet  Commonly known as:  ELIQUIS  Take 1 tablet (2.5 mg total) by mouth 2 (two) times daily.     b complex vitamins tablet  Take 1 tablet by mouth daily at 12 noon.     cholecalciferol 1000 UNITS tablet  Commonly known as:  VITAMIN D  Take 1,000 Units by mouth daily.     escitalopram 10 MG tablet  Commonly known as:  LEXAPRO  Take 10 mg by mouth daily.     furosemide 40 MG tablet  Commonly known as:  LASIX  Take 1 tablet (40 mg total) by mouth daily.     insulin glargine  100 UNIT/ML injection  Commonly known as:  LANTUS  Inject 0.2 mLs (20 Units total) into the skin at bedtime.     insulin regular 100 units/mL injection  Commonly known as:  NOVOLIN R,HUMULIN R  Inject 0.04-0.25 mLs (4-25 Units total) into the skin 3 (three) times daily before meals. Per sliding scale:  0 - 120 = 0 units  121 - 150 =2 units  151 - 200 = 4 units  201 - 250 = 6 units  251 - 300 = 10 units  >300 = 12 units     latanoprost 0.005 % ophthalmic solution  Commonly known as:  XALATAN  Place 1 drop into both eyes at bedtime.     metoprolol tartrate 25 MG tablet  Commonly known as:  LOPRESSOR  Take 1 tablet (25 mg total) by mouth 2 (two) times daily.     OCUVITE PRESERVISION PO  Take 1 tablet by mouth 2 (two) times daily.     potassium chloride SA 20 MEQ tablet  Commonly known as:  K-DUR,KLOR-CON  Take 2 tablets (40 mEq total) by mouth daily.     QUEtiapine 25 MG tablet  Commonly known as:  SEROQUEL  Take 6.25 mg by mouth at bedtime.     simvastatin 20 MG tablet  Commonly known as:  ZOCOR  Take 1 tablet (20 mg total) by mouth every evening.     timolol 0.5 % ophthalmic solution  Commonly known as:  BETIMOL  Place 1 drop into both eyes 2 (two) times daily.       Follow-up Information   Follow up with PANG,RICHARD, MD In 1 week.   Contact information:   [address] [place], [address] [place] [address] [phone]        The results of significant diagnostics from this hospitalization (including imaging, microbiology, ancillary and laboratory) are listed below for reference.    Significant Diagnostic Studies: Dg Chest 2 View  03/27/2013  *RADIOLOGY REPORT*  Clinical Data: Shortness of breath.  Hypertension.  CHEST - 2 VIEW  Comparison: 02/24/2013  Findings: Cardiomegaly noted with bilateral interstitial accentuation, Kerley B lines, and bilateral pleural effusions. Increased retrocardiac airspace opacity noted.  Increase in degree of edema noted.   There is fluid in the minor fissure.  IMPRESSION:  1.  Cardiomegaly and increased edema. 2.  Small to moderate bilateral pleural effusions. 3.  Retrocardiac airspace opacities increased, probably passive atelectasis associated with the pleural effusions, pneumonia is not excluded.   Original Report Authenticated By: Gaylyn Rong, M.D.     Microbiology: Recent Results (from the past 240 hour(s))  URINE CULTURE     Status: None   Collection Time    03/27/13  9:42 AM  Result Value Range Status   Specimen Description URINE, CLEAN CATCH   Final   Special Requests NONE   Final   Culture  Setup Time 03/27/2013 13:36   Final   Colony Count 75,000 COLONIES/ML   Final   Culture     Final   Value: Multiple bacterial morphotypes present, none predominant. Suggest appropriate recollection if clinically indicated.   Report Status 03/28/2013 FINAL   Final  MRSA PCR SCREENING     Status: None   Collection Time    03/27/13  2:27 PM      Result Value Range Status   MRSA by PCR NEGATIVE  NEGATIVE Final   Comment:            The GeneXpert MRSA Assay (FDA     approved for NASAL specimens     only), is one component of a     comprehensive MRSA colonization     surveillance program. It is not     intended to diagnose MRSA     infection nor to guide or     monitor treatment for     MRSA infections.     Labs: Basic Metabolic Panel:  Recent Labs Lab 03/27/13 1004 03/28/13 0519 03/29/13 0504 03/30/13 0500 03/31/13 0425 04/01/13 0635  NA 138 137 140 137 137 138  K 3.9 3.6 3.2* 3.4* 3.9 3.8  CL 101 99 100 99 98 98  CO2 27 31 33* 34* 33* 33*  GLUCOSE 133* 57* 158* 165* 188* 95  BUN 28* 26* 22 22 24* 20  CREATININE 1.21* 1.22* 1.17* 1.24* 1.21* 1.18*  CALCIUM 9.0 8.8 8.8 9.0 8.8 8.8  MG 2.5  --   --   --   --   --   PHOS 4.2  --   --   --   --   --    Liver Function Tests:  Recent Labs Lab 03/27/13 1004 03/28/13 0519  AST 34 48*  ALT 37* 31  ALKPHOS 59 53  BILITOT 0.9 0.8   PROT 6.4 6.1  ALBUMIN 3.3* 3.0*   No results found for this basename: LIPASE, AMYLASE,  in the last 168 hours No results found for this basename: AMMONIA,  in the last 168 hours CBC:  Recent Labs Lab 03/27/13 1004 03/28/13 0519 03/29/13 0504 04/01/13 0635  WBC 10.3 10.1 8.5 10.0  HGB 11.9* 10.9* 10.9* 11.5*  HCT 36.2 33.3* 33.5* 34.9*  MCV 94.8 94.9 94.4 93.8  PLT 193 206 185 184   Cardiac Enzymes:  Recent Labs Lab 03/27/13 1004  TROPONINI <0.30   BNP: BNP (last 3 results)  Recent Labs  03/27/13 1004 03/31/13 0425  PROBNP 3476.0* 2796.0*   CBG:  Recent Labs Lab 03/31/13 1136 03/31/13 1702 03/31/13 2102 03/31/13 2334 04/01/13 0752  GLUCAP 289* 142* 213* 168* 87       Signed:  Severino Paolo  [facility] 04/01/2013, 11:25 AM

## 2013-05-28 ENCOUNTER — Other Ambulatory Visit: Payer: Self-pay

## 2013-05-28 ENCOUNTER — Encounter (HOSPITAL_COMMUNITY): Payer: Self-pay | Admitting: Emergency Medicine

## 2013-05-28 ENCOUNTER — Emergency Department (HOSPITAL_COMMUNITY): Payer: Medicare Other

## 2013-05-28 ENCOUNTER — Inpatient Hospital Stay (HOSPITAL_COMMUNITY)
Admission: EM | Admit: 2013-05-28 | Discharge: 2013-06-03 | DRG: 308 | Disposition: A | Discharge status: Skilled Nursing Facility | Payer: Medicare Other | Attending: Internal Medicine | Admitting: Internal Medicine

## 2013-05-28 DIAGNOSIS — K2210 Ulcer of esophagus without bleeding: Secondary | ICD-10-CM

## 2013-05-28 DIAGNOSIS — R0602 Shortness of breath: Secondary | ICD-10-CM

## 2013-05-28 DIAGNOSIS — N390 Urinary tract infection, site not specified: Secondary | ICD-10-CM

## 2013-05-28 DIAGNOSIS — J9601 Acute respiratory failure with hypoxia: Secondary | ICD-10-CM

## 2013-05-28 DIAGNOSIS — Z66 Do not resuscitate: Secondary | ICD-10-CM | POA: Diagnosis present

## 2013-05-28 DIAGNOSIS — M129 Arthropathy, unspecified: Secondary | ICD-10-CM | POA: Diagnosis present

## 2013-05-28 DIAGNOSIS — D649 Anemia, unspecified: Secondary | ICD-10-CM

## 2013-05-28 DIAGNOSIS — E119 Type 2 diabetes mellitus without complications: Secondary | ICD-10-CM

## 2013-05-28 DIAGNOSIS — N039 Chronic nephritic syndrome with unspecified morphologic changes: Secondary | ICD-10-CM | POA: Diagnosis present

## 2013-05-28 DIAGNOSIS — E162 Hypoglycemia, unspecified: Secondary | ICD-10-CM | POA: Diagnosis not present

## 2013-05-28 DIAGNOSIS — I5031 Acute diastolic (congestive) heart failure: Secondary | ICD-10-CM

## 2013-05-28 DIAGNOSIS — E11649 Type 2 diabetes mellitus with hypoglycemia without coma: Secondary | ICD-10-CM

## 2013-05-28 DIAGNOSIS — D631 Anemia in chronic kidney disease: Secondary | ICD-10-CM | POA: Diagnosis present

## 2013-05-28 DIAGNOSIS — I4891 Unspecified atrial fibrillation: Principal | ICD-10-CM

## 2013-05-28 DIAGNOSIS — I5033 Acute on chronic diastolic (congestive) heart failure: Secondary | ICD-10-CM | POA: Diagnosis present

## 2013-05-28 DIAGNOSIS — I129 Hypertensive chronic kidney disease with stage 1 through stage 4 chronic kidney disease, or unspecified chronic kidney disease: Secondary | ICD-10-CM | POA: Diagnosis present

## 2013-05-28 DIAGNOSIS — R918 Other nonspecific abnormal finding of lung field: Secondary | ICD-10-CM | POA: Diagnosis present

## 2013-05-28 DIAGNOSIS — N179 Acute kidney failure, unspecified: Secondary | ICD-10-CM | POA: Diagnosis present

## 2013-05-28 DIAGNOSIS — I509 Heart failure, unspecified: Secondary | ICD-10-CM

## 2013-05-28 DIAGNOSIS — F0390 Unspecified dementia without behavioral disturbance: Secondary | ICD-10-CM | POA: Diagnosis present

## 2013-05-28 DIAGNOSIS — N183 Chronic kidney disease, stage 3 unspecified: Secondary | ICD-10-CM

## 2013-05-28 DIAGNOSIS — Z794 Long term (current) use of insulin: Secondary | ICD-10-CM

## 2013-05-28 DIAGNOSIS — J209 Acute bronchitis, unspecified: Secondary | ICD-10-CM

## 2013-05-28 DIAGNOSIS — E876 Hypokalemia: Secondary | ICD-10-CM | POA: Diagnosis not present

## 2013-05-28 DIAGNOSIS — H9190 Unspecified hearing loss, unspecified ear: Secondary | ICD-10-CM | POA: Diagnosis present

## 2013-05-28 LAB — POCT I-STAT 3, ART BLOOD GAS (G3+)
Acid-Base Excess: 1 mmol/L (ref 0.0–2.0)
O2 Saturation: 99 %
TCO2: 27 mmol/L (ref 0–100)
pCO2 arterial: 39 mmHg (ref 35.0–45.0)

## 2013-05-28 LAB — URINALYSIS, ROUTINE W REFLEX MICROSCOPIC
Bilirubin Urine: NEGATIVE
Glucose, UA: NEGATIVE mg/dL
Ketones, ur: NEGATIVE mg/dL
Nitrite: NEGATIVE
Specific Gravity, Urine: 1.014 (ref 1.005–1.030)
pH: 5.5 (ref 5.0–8.0)

## 2013-05-28 LAB — CBC
HCT: 36.3 % (ref 36.0–46.0)
MCH: 29.8 pg (ref 26.0–34.0)
MCV: 91 fL (ref 78.0–100.0)
RBC: 3.99 MIL/uL (ref 3.87–5.11)
WBC: 9.8 10*3/uL (ref 4.0–10.5)

## 2013-05-28 LAB — URINE MICROSCOPIC-ADD ON

## 2013-05-28 LAB — BASIC METABOLIC PANEL
BUN: 42 mg/dL — ABNORMAL HIGH (ref 6–23)
CO2: 27 mEq/L (ref 19–32)
Calcium: 9.5 mg/dL (ref 8.4–10.5)
Chloride: 99 mEq/L (ref 96–112)
Creatinine, Ser: 1.57 mg/dL — ABNORMAL HIGH (ref 0.50–1.10)
Glucose, Bld: 202 mg/dL — ABNORMAL HIGH (ref 70–99)

## 2013-05-28 LAB — PRO B NATRIURETIC PEPTIDE: Pro B Natriuretic peptide (BNP): 16021 pg/mL — ABNORMAL HIGH (ref 0–450)

## 2013-05-28 MED ORDER — FUROSEMIDE 10 MG/ML IJ SOLN
40.0000 mg | Freq: Once | INTRAMUSCULAR | Status: AC
  Administered 2013-05-28: 40 mg via INTRAVENOUS
  Filled 2013-05-28: qty 4

## 2013-05-28 MED ORDER — ONDANSETRON HCL 4 MG PO TABS: 4.0000 mg | ORAL_TABLET | Freq: Four times a day (QID) | ORAL | Status: DC | PRN

## 2013-05-28 MED ORDER — TIMOLOL MALEATE 0.5 % OP SOLN
1.0000 [drp] | Freq: Two times a day (BID) | OPHTHALMIC | Status: DC
  Administered 2013-05-28 – 2013-06-03 (×12): 1 [drp] via OPHTHALMIC
  Filled 2013-05-28: qty 5

## 2013-05-28 MED ORDER — INSULIN GLARGINE 100 UNIT/ML ~~LOC~~ SOLN
15.0000 [IU] | Freq: Every day | SUBCUTANEOUS | Status: DC
Start: 2013-05-28 — End: 2013-05-30
  Administered 2013-05-28 – 2013-05-29 (×2): 15 [IU] via SUBCUTANEOUS
  Filled 2013-05-28 (×2): qty 0.15

## 2013-05-28 MED ORDER — DEXTROSE 5 % IV SOLN
1.0000 g | Freq: Once | INTRAVENOUS | Status: AC
  Administered 2013-05-28: 1 g via INTRAVENOUS
  Filled 2013-05-28: qty 10

## 2013-05-28 MED ORDER — ONDANSETRON HCL 4 MG/2ML IJ SOLN: 4.0000 mg | Freq: Four times a day (QID) | INTRAMUSCULAR | Status: DC | PRN

## 2013-05-28 MED ORDER — METOPROLOL TARTRATE 25 MG PO TABS
25.0000 mg | ORAL_TABLET | Freq: Two times a day (BID) | ORAL | Status: DC
  Administered 2013-05-29 – 2013-05-31 (×5): 25 mg via ORAL
  Filled 2013-05-28 (×9): qty 1

## 2013-05-28 MED ORDER — FUROSEMIDE 10 MG/ML IJ SOLN
40.0000 mg | Freq: Four times a day (QID) | INTRAMUSCULAR | Status: DC
  Administered 2013-05-29 – 2013-05-30 (×6): 40 mg via INTRAVENOUS
  Filled 2013-05-28 (×10): qty 4

## 2013-05-28 MED ORDER — ACETAMINOPHEN 650 MG RE SUPP: 650.0000 mg | Freq: Four times a day (QID) | RECTAL | Status: DC | PRN

## 2013-05-28 MED ORDER — SPIRONOLACTONE 12.5 MG HALF TABLET
12.5000 mg | ORAL_TABLET | Freq: Every morning | ORAL | Status: DC
  Filled 2013-05-28: qty 1

## 2013-05-28 MED ORDER — LATANOPROST 0.005 % OP SOLN
1.0000 [drp] | Freq: Every day | OPHTHALMIC | Status: DC
  Administered 2013-05-28 – 2013-06-02 (×6): 1 [drp] via OPHTHALMIC
  Filled 2013-05-28: qty 2.5

## 2013-05-28 MED ORDER — POTASSIUM CHLORIDE CRYS ER 20 MEQ PO TBCR
40.0000 meq | EXTENDED_RELEASE_TABLET | Freq: Every day | ORAL | Status: DC
  Administered 2013-05-29 – 2013-05-31 (×3): 40 meq via ORAL
  Filled 2013-05-28 (×4): qty 2

## 2013-05-28 MED ORDER — ACETAMINOPHEN 325 MG PO TABS: 650.0000 mg | ORAL_TABLET | Freq: Four times a day (QID) | ORAL | Status: DC | PRN

## 2013-05-28 MED ORDER — DEXTROSE 5 % IV SOLN
5.0000 mg/h | INTRAVENOUS | Status: DC
  Administered 2013-05-28 – 2013-05-29 (×2): 5 mg/h via INTRAVENOUS

## 2013-05-28 MED ORDER — DILTIAZEM LOAD VIA INFUSION
10.0000 mg | Freq: Once | INTRAVENOUS | Status: AC
  Administered 2013-05-28: 10 mg via INTRAVENOUS
  Filled 2013-05-28: qty 10

## 2013-05-28 MED ORDER — ESCITALOPRAM OXALATE 10 MG PO TABS
10.0000 mg | ORAL_TABLET | Freq: Every day | ORAL | Status: DC
  Administered 2013-05-29 – 2013-06-03 (×6): 10 mg via ORAL
  Filled 2013-05-28 (×6): qty 1

## 2013-05-28 MED ORDER — LEVALBUTEROL HCL 0.63 MG/3ML IN NEBU: 0.6300 mg | INHALATION_SOLUTION | Freq: Four times a day (QID) | RESPIRATORY_TRACT | Status: DC | PRN

## 2013-05-28 MED ORDER — SODIUM CHLORIDE 0.9 % IJ SOLN
3.0000 mL | Freq: Two times a day (BID) | INTRAMUSCULAR | Status: DC
  Administered 2013-05-28 – 2013-05-31 (×6): 3 mL via INTRAVENOUS

## 2013-05-28 MED ORDER — SIMVASTATIN 20 MG PO TABS
20.0000 mg | ORAL_TABLET | Freq: Every evening | ORAL | Status: DC
  Filled 2013-05-28: qty 1

## 2013-05-28 MED ORDER — SODIUM CHLORIDE 0.9 % IJ SOLN
3.0000 mL | Freq: Two times a day (BID) | INTRAMUSCULAR | Status: DC
  Administered 2013-05-29 – 2013-06-02 (×8): 3 mL via INTRAVENOUS

## 2013-05-28 MED ORDER — INSULIN ASPART 100 UNIT/ML ~~LOC~~ SOLN
0.0000 [IU] | Freq: Three times a day (TID) | SUBCUTANEOUS | Status: DC
  Administered 2013-05-29: 9 [IU] via SUBCUTANEOUS
  Administered 2013-05-29: 5 [IU] via SUBCUTANEOUS

## 2013-05-28 MED ORDER — VITAMIN D3 25 MCG (1000 UNIT) PO TABS
1000.0000 [IU] | ORAL_TABLET | Freq: Every day | ORAL | Status: DC
  Administered 2013-05-29 – 2013-06-03 (×6): 1000 [IU] via ORAL
  Filled 2013-05-28 (×6): qty 1

## 2013-05-28 MED ORDER — LEVALBUTEROL HCL 0.63 MG/3ML IN NEBU
0.6300 mg | INHALATION_SOLUTION | Freq: Four times a day (QID) | RESPIRATORY_TRACT | Status: DC
  Administered 2013-05-29 (×2): 0.63 mg via RESPIRATORY_TRACT
  Filled 2013-05-28 (×7): qty 3

## 2013-05-28 MED ORDER — ASPIRIN EC 325 MG PO TBEC
325.0000 mg | DELAYED_RELEASE_TABLET | Freq: Once | ORAL | Status: AC
  Administered 2013-05-28: 325 mg via ORAL
  Filled 2013-05-28: qty 1

## 2013-05-28 MED ORDER — CIPROFLOXACIN IN D5W 400 MG/200ML IV SOLN
400.0000 mg | INTRAVENOUS | Status: DC
Start: 2013-05-28 — End: 2013-05-29
  Administered 2013-05-28: 400 mg via INTRAVENOUS
  Filled 2013-05-28 (×2): qty 200

## 2013-05-28 MED ORDER — APIXABAN 2.5 MG PO TABS
2.5000 mg | ORAL_TABLET | Freq: Two times a day (BID) | ORAL | Status: DC
  Administered 2013-05-28 – 2013-06-03 (×12): 2.5 mg via ORAL
  Filled 2013-05-28 (×14): qty 1

## 2013-05-28 NOTE — Consult Note (Signed)
Cardiology Consult Note Juline Patch, MD No ref. provider found  Reason for consult: atrial fibrillation with RVR  History of Present Illness (and review of medical records): Morgan Morris is a 79 y.o. female who presents for evaluation of atrial fibrillation with RVR.  She has hx of HTN, DM, Mallory Weiss tear with erosive gastritis 03/2012 and recent admission for Afib with RVR in 03/2013.  TTE revealed norma LV function.  She was started on BB for rate control and Eliquis for anticoagulation.  She now presents to ED with shortness of breath and palpitations.  She also had new lower extremity swelling and cough.  She was found to be in Afib with RVR.  Patient was also in mod respiratory distress requiring breathing treatments, CPAP, and IV lasix.   Review of Systems Further review of systems was otherwise negative other than stated in HPI.  Patient Active Problem List   Diagnosis Date Noted  . Acute respiratory failure with hypoxia 03/28/2013  . Hypoglycemia associated with diabetes 03/28/2013  . Shortness of breath 03/27/2013  . CKD (chronic kidney disease), stage III 03/27/2013  . UTI (lower urinary tract infection) 03/27/2013  . Acute diastolic CHF (congestive heart failure) 03/27/2013  . Atrial fibrillation 02/24/2013  . Erosive esophagitis 04/02/2012  . Mallory - Weiss tear 04/02/2012  . Anemia 03/29/2012   Past Medical History  Diagnosis Date  . Hypertension   . Diabetes mellitus   . Depression   . Erosive esophagitis 04/02/2012  . Mallory - Weiss tear 04/02/2012  . Dysrhythmia 02/24/2013    ATRIAL FIB WITH RVR  . Arthritis     Past Surgical History  Procedure Laterality Date  . Abdominal hysterectomy    . Appendectomy    . Cholecystectomy    . Esophagogastroduodenoscopy  03/31/2012    Procedure: ESOPHAGOGASTRODUODENOSCOPY (EGD);  Surgeon: Meryl Dare, MD,FACG;  Location: [facility];  Service: Endoscopy;  Laterality: N/A;     (Not in a hospital  admission) Allergies  Allergen Reactions  . Penicillins   . Sulfa Antibiotics     History  Substance Use Topics  . Smoking status: Never Smoker   . Smokeless tobacco: Never Used  . Alcohol Use: No    Family History  Problem Relation Age of Onset  . Cancer Mother      Objective:  Patient Vitals for the past 8 hrs:  BP Temp Temp src Pulse Resp SpO2  05/28/13 1930 113/54 mmHg - - 100 17 100 %  05/28/13 1845 136/92 mmHg - - - 27 -  05/28/13 1800 108/75 mmHg - - - 21 -  05/28/13 1746 - - - - - 77 %  05/28/13 1745 138/52 mmHg - - - 24 -  05/28/13 1742 151/82 mmHg 97.8 F (36.6 C) Oral 111 24 100 %   BP 108/49  Pulse 107  Temp(Src) 97.5 F (36.4 C) (Oral)  Resp 16  Ht 5\' 2"  (1.575 m)  Wt 69.8 kg (153 lb 14.1 oz)  BMI 28.14 kg/m2  SpO2 100%  General Appearance:    Alert, cooperative, no distress, appears stated age  Head:    Normocephalic, without obvious abnormality, atraumatic  Eyes:    PERRL, conjunctiva/corneas clear, EOM's intact, fundi    benign, both eyes  Neck:   Supple, symmetrical, trachea midline,  Lungs:     Clear to auscultation bilaterally, respirations unlabored CV:  Irregular rate and rhythm  Abdomen:     Soft, non-tender, bowel sounds active all four quadrants,  no masses, no organomegaly  Extremities:   Extremities normal, atraumatic, no cyanosis or edema  Pulses:   2+ and symmetric all extremities          Results for orders placed during the hospital encounter of 05/28/13 (from the past 48 hour(s))  CBC     Status: Abnormal   Collection Time    05/28/13  5:52 PM      Result Value Range   WBC 9.8  4.0 - 10.5 K/uL   RBC 3.99  3.87 - 5.11 MIL/uL   Hemoglobin 11.9 (*) 12.0 - 15.0 g/dL   HCT 81.1  91.4 - 78.2 %   MCV 91.0  78.0 - 100.0 fL   MCH 29.8  26.0 - 34.0 pg   MCHC 32.8  30.0 - 36.0 g/dL   RDW 95.6  21.3 - 08.6 %   Platelets 172  150 - 400 K/uL  BASIC METABOLIC PANEL     Status: Abnormal   Collection Time    05/28/13  5:52 PM       Result Value Range   Sodium 138  135 - 145 mEq/L   Potassium 4.7  3.5 - 5.1 mEq/L   Chloride 99  96 - 112 mEq/L   CO2 27  19 - 32 mEq/L   Glucose, Bld 202 (*) 70 - 99 mg/dL   BUN 42 (*) 6 - 23 mg/dL   Creatinine, Ser 5.78 (*) 0.50 - 1.10 mg/dL   Calcium 9.5  8.4 - 46.9 mg/dL   GFR calc non Af Amer 28 (*) >90 mL/min   GFR calc Af Amer 32 (*) >90 mL/min   Comment:            The eGFR has been calculated     using the CKD EPI equation.     This calculation has not been     validated in all clinical     situations.     eGFR's persistently     <90 mL/min signify     possible Chronic Kidney Disease.  PRO B NATRIURETIC PEPTIDE     Status: Abnormal   Collection Time    05/28/13  5:52 PM      Result Value Range   Pro B Natriuretic peptide (BNP) 16021.0 (*) 0 - 450 pg/mL  POCT I-STAT TROPONIN I     Status: None   Collection Time    05/28/13  6:06 PM      Result Value Range   Troponin i, poc 0.01  0.00 - 0.08 ng/mL   Comment 3            Comment: Due to the release kinetics of cTnI,     a negative result within the first hours     of the onset of symptoms does not rule out     myocardial infarction with certainty.     If myocardial infarction is still suspected,     repeat the test at appropriate intervals.  POCT I-STAT 3, BLOOD GAS (G3+)     Status: Abnormal   Collection Time    05/28/13  6:24 PM      Result Value Range   pH, Arterial 7.424  7.350 - 7.450   pCO2 arterial 39.0  35.0 - 45.0 mmHg   pO2, Arterial 133.0 (*) 80.0 - 100.0 mmHg   Bicarbonate 25.5 (*) 20.0 - 24.0 mEq/L   TCO2 27  0 - 100 mmol/L   O2 Saturation 99.0     Acid-Base  Excess 1.0  0.0 - 2.0 mmol/L   Patient temperature 98.6 F     Collection site RADIAL, ALLEN'S TEST ACCEPTABLE     Drawn by RT     Sample type ARTERIAL    URINALYSIS, ROUTINE W REFLEX MICROSCOPIC     Status: Abnormal   Collection Time    05/28/13  7:06 PM      Result Value Range   Color, Urine YELLOW  YELLOW   APPearance CLOUDY (*) CLEAR    Specific Gravity, Urine 1.014  1.005 - 1.030   pH 5.5  5.0 - 8.0   Glucose, UA NEGATIVE  NEGATIVE mg/dL   Hgb urine dipstick LARGE (*) NEGATIVE   Bilirubin Urine NEGATIVE  NEGATIVE   Ketones, ur NEGATIVE  NEGATIVE mg/dL   Protein, ur 30 (*) NEGATIVE mg/dL   Urobilinogen, UA 0.2  0.0 - 1.0 mg/dL   Nitrite NEGATIVE  NEGATIVE   Leukocytes, UA LARGE (*) NEGATIVE  URINE MICROSCOPIC-ADD ON     Status: Abnormal   Collection Time    05/28/13  7:06 PM      Result Value Range   Squamous Epithelial / LPF MANY (*) RARE   WBC, UA 7-10  <3 WBC/hpf   RBC / HPF 11-20  <3 RBC/hpf   Bacteria, UA FEW (*) RARE   Crystals CA OXALATE CRYSTALS (*) NEGATIVE   Urine-Other MUCOUS PRESENT     Comment: AMORPHOUS URATES/PHOSPHATES     LESS THAN 10 mL OF URINE SUBMITTED   Dg Chest 2 View  05/28/2013   *RADIOLOGY REPORT*  Clinical Data: Short of breath.  CHEST - 2 VIEW  Comparison: 03/27/2013.  Findings: Cardiomegaly and interstitial pulmonary edema are present.  Calcification over the right lung base has moved compared to the prior exam, likely within the right breast.  Probable tiny bilateral pleural effusions.  Aortic arch atherosclerosis. Monitoring leads are projected over the chest. Faint Kerley B lines are noted in the periphery of the bases.  IMPRESSION: Mild CHF with cardiomegaly and interstitial pulmonary edema.   Original Report Authenticated By: Andreas Newport, M.D.    ECG:  Atrial fibrillation with RVR HR 124, nonspecific ST-T changes  Impression: Atrial Fibrillation with RVR Mild volume overload, likely 2/2 to above UTI  Recommendations: 1. Attempt rate control with Diltiazem infusion 2. Continue anticoagulation 3. IV lasix for gentle diuresis, strict I/Os, daily weights 4.  When rate better controlled and more euvolemic, consider transition to oral BBs. 5.  If still in Afib, may make further recommendations for TEE/DCCV. 6.  Antibiotics per primary team for UTI.

## 2013-05-28 NOTE — ED Notes (Signed)
Care transferred and report given to [place], [place]

## 2013-05-28 NOTE — Progress Notes (Signed)
79yo female c/o SOB x3d, found with UTI, to begin Cipro.  Will start Cipro 400mg  IV Q24H for CrCl ~20 ml/min.  Vernard Gambles, PharmD, BCPS 05/28/2013 10:23 PM

## 2013-05-28 NOTE — ED Provider Notes (Signed)
I saw and evaluated the patient, reviewed the resident's note and I agree with the findings and plan.  EKG Rate 124 Atrial fibrillation Low-voltage throughout Borderline repolarization abnormality EKG not significantly changed his prior EKG  On exam pt has wheezing, tachycardia, irregularly irregular and peripheral edema.  Treated with cardizem and diuretics for CHF and rapid a fib.  Will plan on cardiac consultation regarding admission.  CRITICAL CARE Performed by: Celene Kras Total critical care time: 40 Critical care time was exclusive of separately billable procedures and treating other patients. Critical care was necessary to treat or prevent imminent or life-threatening deterioration. Critical care was time spent personally by me on the following activities: development of treatment plan with patient and/or surrogate as well as nursing, discussions with consultants, evaluation of patient's response to treatment, examination of patient, obtaining history from patient or surrogate, ordering and performing treatments and interventions, ordering and review of laboratory studies, ordering and review of radiographic studies, pulse oximetry and re-evaluation of patient's condition.   Celene Kras, MD 05/28/13 2040

## 2013-05-28 NOTE — ED Provider Notes (Signed)
History     CSN: [id number]  Arrival date & time 05/28/13  1739   First MD Initiated Contact with Patient 05/28/13 1740      Chief Complaint  Patient presents with  . Shortness of Breath    (Consider location/radiation/quality/duration/timing/severity/associated sxs/prior treatment) Patient is a 79 y.o. female presenting with shortness of breath.  Shortness of Breath Severity:  Moderate Onset quality:  Gradual Duration:  3 days Timing:  Constant Progression:  Worsening Chronicity:  Recurrent Context comment:  Recent dc for pna and afib with rvr Relieved by:  None tried Worsened by:  Coughing, activity and exertion Ineffective treatments:  None tried Associated symptoms: cough and wheezing   Associated symptoms: no abdominal pain, no chest pain, no fever, no rash, no sore throat and no vomiting   Risk factors: no hx of PE/DVT     Past Medical History  Diagnosis Date  . Hypertension   . Diabetes mellitus   . Depression   . Erosive esophagitis 04/02/2012  . Mallory - Weiss tear 04/02/2012  . Dysrhythmia 02/24/2013    ATRIAL FIB WITH RVR  . Arthritis     Past Surgical History  Procedure Laterality Date  . Abdominal hysterectomy    . Appendectomy    . Cholecystectomy    . Esophagogastroduodenoscopy  03/31/2012    Procedure: ESOPHAGOGASTRODUODENOSCOPY (EGD);  Surgeon: Meryl Dare, MD,FACG;  Location: [facility];  Service: Endoscopy;  Laterality: N/A;    Family History  Problem Relation Age of Onset  . Cancer Mother     History  Substance Use Topics  . Smoking status: Never Smoker   . Smokeless tobacco: Never Used  . Alcohol Use: No    OB History   Grav Para Term Preterm Abortions TAB SAB Ect Mult Living                  Review of Systems  Constitutional: Negative for fever and chills.  HENT: Negative for congestion, sore throat and rhinorrhea.   Eyes: Negative for photophobia and visual disturbance.  Respiratory: Positive for cough, shortness of  breath and wheezing.   Cardiovascular: Negative for chest pain and leg swelling.  Gastrointestinal: Negative for nausea, vomiting, abdominal pain, diarrhea and constipation.  Endocrine: Negative for polyphagia and polyuria.  Genitourinary: Negative for dysuria, flank pain, vaginal bleeding, vaginal discharge and enuresis.  Musculoskeletal: Negative for back pain and gait problem.  Skin: Negative for color change and rash.  Neurological: Negative for dizziness, syncope, light-headedness and numbness.  Hematological: Negative for adenopathy. Does not bruise/bleed easily.  All other systems reviewed and are negative.    Allergies  Penicillins and Sulfa antibiotics  Home Medications   Current Outpatient Rx  Name  Route  Sig  Dispense  Refill  . albuterol (PROVENTIL HFA;VENTOLIN HFA) 108 (90 BASE) MCG/ACT inhaler   Inhalation   Inhale 2 puffs into the lungs every 6 (six) hours as needed for wheezing.   1 Inhaler   2   . albuterol (PROVENTIL) (2.5 MG/3ML) 0.083% nebulizer solution   Nebulization   Take 2.5 mg by nebulization every 6 (six) hours as needed for wheezing.         Marland Kitchen apixaban (ELIQUIS) 2.5 MG TABS tablet   Oral   Take 1 tablet (2.5 mg total) by mouth 2 (two) times daily.   20 tablet   5   . b complex vitamins tablet   Oral   Take 1 tablet by mouth daily at 12 noon.         Marland Kitchen  cholecalciferol (VITAMIN D) 1000 UNITS tablet   Oral   Take 1,000 Units by mouth daily.         Marland Kitchen escitalopram (LEXAPRO) 10 MG tablet   Oral   Take 10 mg by mouth daily.         . furosemide (LASIX) 40 MG tablet   Oral   Take 20-40 mg by mouth 2 (two) times daily. 40 mg in a.m. And 20 mg at noon.         . insulin glargine (LANTUS) 100 UNIT/ML injection   Subcutaneous   Inject 15 Units into the skin at bedtime.         . insulin regular (NOVOLIN R,HUMULIN R) 100 units/mL injection   Subcutaneous   Inject 0.04-0.25 mLs (4-25 Units total) into the skin 3 (three) times daily  before meals. Per sliding scale: 0 - 120 = 0 units 121 - 150 =2 units 151 - 200 = 4 units 201 - 250 = 6 units 251 - 300 = 10 units >300 = 12 units   10 mL   0   . latanoprost (XALATAN) 0.005 % ophthalmic solution   Both Eyes   Place 1 drop into both eyes at bedtime.         Marland Kitchen lisinopril (PRINIVIL,ZESTRIL) 2.5 MG tablet   Oral   Take 2.5 mg by mouth daily.         . metoprolol tartrate (LOPRESSOR) 25 MG tablet   Oral   Take 1 tablet (25 mg total) by mouth 2 (two) times daily.   60 tablet   0   . Multiple Vitamins-Minerals (OCUVITE PRESERVISION PO)   Oral   Take 1 tablet by mouth 2 (two) times daily.         . potassium chloride SA (K-DUR,KLOR-CON) 20 MEQ tablet   Oral   Take 2 tablets (40 mEq total) by mouth daily.         . QUEtiapine (SEROQUEL) 25 MG tablet   Oral   Take 6.25 mg by mouth at bedtime.         . simvastatin (ZOCOR) 20 MG tablet   Oral   Take 1 tablet (20 mg total) by mouth every evening.   30 tablet   5   . spironolactone (ALDACTONE) 25 MG tablet   Oral   Take 12.5 mg by mouth every morning.         . timolol (BETIMOL) 0.5 % ophthalmic solution   Both Eyes   Place 1 drop into both eyes 2 (two) times daily.           BP 113/54  Pulse 100  Temp(Src) 97.8 F (36.6 C) (Oral)  Resp 17  SpO2 100%  Physical Exam  Vitals reviewed. Constitutional: She is oriented to person, place, and time. She appears well-developed and well-nourished.  HENT:  Head: Normocephalic and atraumatic.  Right Ear: External ear normal.  Left Ear: External ear normal.  Eyes: Conjunctivae and EOM are normal. Pupils are equal, round, and reactive to light.  Neck: Normal range of motion. Neck supple.  Cardiovascular: Normal rate, regular rhythm, normal heart sounds and intact distal pulses.   Pulmonary/Chest: Tachypnea noted. She has wheezes in the right upper field, the right middle field, the right lower field, the left upper field and the left middle  field. She has rales in the left middle field and the left lower field.  Abdominal: Soft. Bowel sounds are normal.  Musculoskeletal: Normal range of motion.  Right lower leg: She exhibits edema.       Left lower leg: She exhibits edema.  Neurological: She is alert and oriented to person, place, and time.  Skin: Skin is warm and dry.    ED Course  Procedures (including critical care time)  Labs Reviewed  CBC - Abnormal; Notable for the following:    Hemoglobin 11.9 (*)    All other components within normal limits  BASIC METABOLIC PANEL - Abnormal; Notable for the following:    Glucose, Bld 202 (*)    BUN 42 (*)    Creatinine, Ser 1.57 (*)    GFR calc non Af Amer 28 (*)    GFR calc Af Amer 32 (*)    All other components within normal limits  PRO B NATRIURETIC PEPTIDE - Abnormal; Notable for the following:    Pro B Natriuretic peptide (BNP) 16021.0 (*)    All other components within normal limits  URINALYSIS, ROUTINE W REFLEX MICROSCOPIC - Abnormal; Notable for the following:    APPearance CLOUDY (*)    Hgb urine dipstick LARGE (*)    Protein, ur 30 (*)    Leukocytes, UA LARGE (*)    All other components within normal limits  URINE MICROSCOPIC-ADD ON - Abnormal; Notable for the following:    Squamous Epithelial / LPF MANY (*)    Bacteria, UA FEW (*)    Crystals CA OXALATE CRYSTALS (*)    All other components within normal limits  POCT I-STAT 3, BLOOD GAS (G3+) - Abnormal; Notable for the following:    pO2, Arterial 133.0 (*)    Bicarbonate 25.5 (*)    All other components within normal limits  URINE CULTURE  POCT I-STAT TROPONIN I   Dg Chest 2 View  05/28/2013   *RADIOLOGY REPORT*  Clinical Data: Short of breath.  CHEST - 2 VIEW  Comparison: 03/27/2013.  Findings: Cardiomegaly and interstitial pulmonary edema are present.  Calcification over the right lung base has moved compared to the prior exam, likely within the right breast.  Probable tiny bilateral pleural  effusions.  Aortic arch atherosclerosis. Monitoring leads are projected over the chest. Faint Kerley B lines are noted in the periphery of the bases.  IMPRESSION: Mild CHF with cardiomegaly and interstitial pulmonary edema.   Original Report Authenticated By: Andreas Newport, M.D.     No diagnosis found.   Date: 05/28/2013  Rate: 124  Rhythm: atrial fibrillation  QRS Axis: indeterminate  Intervals: normal  ST/T Wave abnormalities: normal  Conduction Disutrbances:none  Narrative Interpretation:   Old EKG Reviewed: changes noted    MDM  79 y.o. female  with pertinent PMH of afib, dm, htn  presents with dyspnea x 3 days as described above.  No reported fevers.  EMS arrived to find pt dyspneic, tachypneic, and wheezing, so administered duoneb and solumedrol, pt desatted to 77%, placed on cpap, sats improved to mid 80s.  On arrival vitals as above, sats 100% on [place].  Physical exam with wheezing, rales, and bil pedal edema.  Will obtain cxr prior to use of fluids or lasix for possible CHF vs PNA.    CXR with cardiomegaly and pulmonary edema, BNP 16000, consulted cardiology who requested medicine admission with their consultation.  Medicine requested stepdown bed.  Rocephin given for UTI.  Lasix 40 mg given IV.  Transferred in stable condition.    Labs and imaging as above reviewed by myself and attending,Dr. Lynelle Doctor, with whom case was discussed.   Diagnosis: Afib  with RVR          Noel Gerold, MD 05/28/13 2035

## 2013-05-28 NOTE — ED Notes (Signed)
RN notified MD of high HR.

## 2013-05-28 NOTE — ED Notes (Signed)
Per EMS pt came from [facility] c/o SOB x 3 days. Pt had wheezing to lung fields and EMS gave 5mg  albuterol with no relief, then duo neb of 5 mg albuterol and 0.5 atrovent. After treatment SpO2 decreased to 77%. Pt then placed on CPAP. Pt also has new bilateral lower extremity edema and cough.

## 2013-05-28 NOTE — H&P (Signed)
[facility] History and Physical  Morgan Morris ZOX:[id number] DOB: July 06, 1920 DOA: 05/28/2013  Referring physician: ER physician. PCP: Juline Patch, MD  Chief Complaint: Shortness of breath and tachycardia.  HPI: Morgan Morris is a 79 y.o. female with known history of atrial fibrillation and CHF was found to be short of breath and when the EMS arrived patient was given Solu-Medrol and nebulizers and initially was placed on CPAP. On arrival the ER patient was found to be in A. fib with RVR with features consistent with CHF. Patient was started on Cardizem infusion after bolus with Lasix 40 mg IV and one dose after which patient's symptoms improved. Patient denies any chest pain or fever chills productive cough. Denies any abdominal pain dysuria diarrhea.  Review of Systems: As presented in the history of presenting illness, rest negative.  Past Medical History  Diagnosis Date  . Hypertension   . Diabetes mellitus   . Depression   . Erosive esophagitis 04/02/2012  . Mallory - Weiss tear 04/02/2012  . Dysrhythmia 02/24/2013    ATRIAL FIB WITH RVR  . Arthritis    Past Surgical History  Procedure Laterality Date  . Abdominal hysterectomy    . Appendectomy    . Cholecystectomy    . Esophagogastroduodenoscopy  03/31/2012    Procedure: ESOPHAGOGASTRODUODENOSCOPY (EGD);  Surgeon: Meryl Dare, MD,FACG;  Location: [facility];  Service: Endoscopy;  Laterality: N/A;   Social History:  reports that she has never smoked. She has never used smokeless tobacco. She reports that she does not drink alcohol or use illicit drugs.  Allergies  Allergen Reactions  . Penicillins   . Sulfa Antibiotics     Family History  Problem Relation Age of Onset  . Cancer Mother       Prior to Admission medications   Medication Sig Start Date End Date Taking? Authorizing Provider  albuterol (PROVENTIL HFA;VENTOLIN HFA) 108 (90 BASE) MCG/ACT inhaler Inhale 2 puffs into the lungs every 6 (six) hours  as needed for wheezing. 02/27/13  Yes Richarda Overlie, MD  albuterol (PROVENTIL) (2.5 MG/3ML) 0.083% nebulizer solution Take 2.5 mg by nebulization every 6 (six) hours as needed for wheezing.   Yes Historical Provider, MD  apixaban (ELIQUIS) 2.5 MG TABS tablet Take 1 tablet (2.5 mg total) by mouth 2 (two) times daily. 02/27/13  Yes Richarda Overlie, MD  b complex vitamins tablet Take 1 tablet by mouth daily at 12 noon.   Yes Historical Provider, MD  cholecalciferol (VITAMIN D) 1000 UNITS tablet Take 1,000 Units by mouth daily.   Yes Historical Provider, MD  escitalopram (LEXAPRO) 10 MG tablet Take 10 mg by mouth daily.   Yes Historical Provider, MD  furosemide (LASIX) 40 MG tablet Take 20-40 mg by mouth 2 (two) times daily. 40 mg in a.m. And 20 mg at noon.   Yes Historical Provider, MD  insulin glargine (LANTUS) 100 UNIT/ML injection Inject 15 Units into the skin at bedtime.   Yes Historical Provider, MD  insulin regular (NOVOLIN R,HUMULIN R) 100 units/mL injection Inject 0.04-0.25 mLs (4-25 Units total) into the skin 3 (three) times daily before meals. Per sliding scale: 0 - 120 = 0 units 121 - 150 =2 units 151 - 200 = 4 units 201 - 250 = 6 units 251 - 300 = 10 units >300 = 12 units 04/01/13  Yes Zannie Cove, MD  latanoprost (XALATAN) 0.005 % ophthalmic solution Place 1 drop into both eyes at bedtime.   Yes Historical Provider, MD  lisinopril (  PRINIVIL,ZESTRIL) 2.5 MG tablet Take 2.5 mg by mouth daily.   Yes Historical Provider, MD  metoprolol tartrate (LOPRESSOR) 25 MG tablet Take 1 tablet (25 mg total) by mouth 2 (two) times daily. 02/27/13  Yes Richarda Overlie, MD  Multiple Vitamins-Minerals (OCUVITE PRESERVISION PO) Take 1 tablet by mouth 2 (two) times daily.   Yes Historical Provider, MD  potassium chloride SA (K-DUR,KLOR-CON) 20 MEQ tablet Take 2 tablets (40 mEq total) by mouth daily. 04/01/13  Yes Zannie Cove, MD  QUEtiapine (SEROQUEL) 25 MG tablet Take 6.25 mg by mouth at bedtime.   Yes Historical  Provider, MD  simvastatin (ZOCOR) 20 MG tablet Take 1 tablet (20 mg total) by mouth every evening. 02/27/13  Yes Richarda Overlie, MD  spironolactone (ALDACTONE) 25 MG tablet Take 12.5 mg by mouth every morning.   Yes Historical Provider, MD  timolol (BETIMOL) 0.5 % ophthalmic solution Place 1 drop into both eyes 2 (two) times daily.   Yes Historical Provider, MD   Physical Exam: Filed Vitals:   05/28/13 1845 05/28/13 1930 05/28/13 2030 05/28/13 2115  BP: 136/92 113/54 104/57 105/65  Pulse:  100 86 103  Temp:      TempSrc:      Resp: 27 17 17 21   SpO2:  100% 95% 98%     General:  Well developed well nourished.  Eyes: Anicteric no pallor.  ENT: No discharge from the ears eyes nose mouth.  Neck: No mass felt.  Cardiovascular: S1-S2 heard.  Respiratory: No rhonchi but mild crepitations.  Abdomen: Soft nontender bowel sounds present.  Skin: No rash.  Musculoskeletal: Mild edema.  Psychiatric: Appears normal.  Neurologic: Alert and oriented to place and person. Moves all extremities.  Labs on Admission:  Basic Metabolic Panel:  Recent Labs Lab 05/28/13 1752  NA 138  K 4.7  CL 99  CO2 27  GLUCOSE 202*  BUN 42*  CREATININE 1.57*  CALCIUM 9.5   Liver Function Tests: No results found for this basename: AST, ALT, ALKPHOS, BILITOT, PROT, ALBUMIN,  in the last 168 hours No results found for this basename: LIPASE, AMYLASE,  in the last 168 hours No results found for this basename: AMMONIA,  in the last 168 hours CBC:  Recent Labs Lab 05/28/13 1752  WBC 9.8  HGB 11.9*  HCT 36.3  MCV 91.0  PLT 172   Cardiac Enzymes: No results found for this basename: CKTOTAL, CKMB, CKMBINDEX, TROPONINI,  in the last 168 hours  BNP (last 3 results)  Recent Labs  03/27/13 1004 03/31/13 0425 05/28/13 1752  PROBNP 3476.0* 2796.0* 16021.0*   CBG: No results found for this basename: GLUCAP,  in the last 168 hours  Radiological Exams on Admission: Dg Chest 2 View  05/28/2013    *RADIOLOGY REPORT*  Clinical Data: Short of breath.  CHEST - 2 VIEW  Comparison: 03/27/2013.  Findings: Cardiomegaly and interstitial pulmonary edema are present.  Calcification over the right lung base has moved compared to the prior exam, likely within the right breast.  Probable tiny bilateral pleural effusions.  Aortic arch atherosclerosis. Monitoring leads are projected over the chest. Faint Kerley B lines are noted in the periphery of the bases.  IMPRESSION: Mild CHF with cardiomegaly and interstitial pulmonary edema.   Original Report Authenticated By: Andreas Newport, M.D.    EKG: Independently reviewed. A. fib with RVR.  Assessment/Plan Principal Problem:   Atrial fibrillation with RVR Active Problems:   Anemia   CKD (chronic kidney disease), stage III  UTI (lower urinary tract infection)   Acute diastolic CHF (congestive heart failure)   Diabetes mellitus   1. A. fib with RVR - continue with Cardizem infusion and the patient takes oral medications. Check cardiac enzymes and TSH. 2. Decompensated CHF last EF measured was March 2014 60-65% - decompensated probably secondary to atrial fibrillation with RVR. 3. Possible UTI - check urine cultures. Continue Cipro. 4. Diabetes mellitus type 2 - continue home medications with sliding-scale coverage. 5. Anemia - follow CBC. 6. Chronic kidney disease - closely follow intake output and metabolic panel.    Code Status: Full code.  Family Communication: None.  Disposition Plan: Admit to inpatient.    Fadel Clason N. [facility] Pager [phone].  If 7PM-7AM, please contact night-coverage [email or web address] Password [facility] 05/28/2013, 9:45 PM

## 2013-05-28 NOTE — ED Notes (Signed)
Morgan Morris (son) [phone]

## 2013-05-29 DIAGNOSIS — J209 Acute bronchitis, unspecified: Secondary | ICD-10-CM | POA: Diagnosis present

## 2013-05-29 DIAGNOSIS — J9600 Acute respiratory failure, unspecified whether with hypoxia or hypercapnia: Secondary | ICD-10-CM

## 2013-05-29 LAB — CBC WITH DIFFERENTIAL/PLATELET
Basophils Relative: 0 % (ref 0–1)
HCT: 34.3 % — ABNORMAL LOW (ref 36.0–46.0)
Lymphs Abs: 0.4 10*3/uL — ABNORMAL LOW (ref 0.7–4.0)
MCHC: 32.4 g/dL (ref 30.0–36.0)
MCV: 90.7 fL (ref 78.0–100.0)
Monocytes Relative: 2 % — ABNORMAL LOW (ref 3–12)
Neutro Abs: 5.6 10*3/uL (ref 1.7–7.7)
Neutrophils Relative %: 92 % — ABNORMAL HIGH (ref 43–77)
WBC: 6.1 10*3/uL (ref 4.0–10.5)

## 2013-05-29 LAB — TROPONIN I
Troponin I: <0.3 ng/mL (ref <0.30)
Troponin I: <0.3 ng/mL (ref <0.30)

## 2013-05-29 LAB — MRSA PCR SCREENING: MRSA by PCR: NEGATIVE

## 2013-05-29 LAB — COMPREHENSIVE METABOLIC PANEL
ALT: 22 U/L (ref 0–35)
AST: 27 U/L (ref 0–37)
Albumin: 3.1 g/dL — ABNORMAL LOW (ref 3.5–5.2)
Alkaline Phosphatase: 55 U/L (ref 39–117)
Chloride: 96 mEq/L (ref 96–112)
Potassium: 4.4 mEq/L (ref 3.5–5.1)
Sodium: 135 mEq/L (ref 135–145)
Total Bilirubin: 0.7 mg/dL (ref 0.3–1.2)
Total Protein: 6.4 g/dL (ref 6.0–8.3)

## 2013-05-29 LAB — GLUCOSE, CAPILLARY
Glucose-Capillary: 290 mg/dL — ABNORMAL HIGH (ref 70–99)
Glucose-Capillary: 211 mg/dL — ABNORMAL HIGH (ref 70–99)

## 2013-05-29 LAB — HEMOGLOBIN A1C: Mean Plasma Glucose: 148 mg/dL — ABNORMAL HIGH (ref <117)

## 2013-05-29 LAB — TSH: TSH: 0.355 u[IU]/mL (ref 0.350–4.500)

## 2013-05-29 MED ORDER — LEVALBUTEROL HCL 0.63 MG/3ML IN NEBU
0.6300 mg | INHALATION_SOLUTION | RESPIRATORY_TRACT | Status: DC | PRN
  Filled 2013-05-29: qty 3

## 2013-05-29 MED ORDER — MENTHOL 3 MG MT LOZG
1.0000 | LOZENGE | OROMUCOSAL | Status: DC | PRN
Start: 2013-05-29 — End: 2013-06-03
  Administered 2013-05-29 – 2013-05-30 (×2): 3 mg via ORAL
  Filled 2013-05-29: qty 9

## 2013-05-29 MED ORDER — DIGOXIN 125 MCG PO TABS
0.1250 mg | ORAL_TABLET | Freq: Every day | ORAL | Status: DC
  Administered 2013-05-30 – 2013-05-31 (×2): 0.125 mg via ORAL
  Filled 2013-05-29 (×2): qty 1

## 2013-05-29 MED ORDER — INSULIN ASPART 100 UNIT/ML ~~LOC~~ SOLN
0.0000 [IU] | Freq: Every day | SUBCUTANEOUS | Status: DC
  Administered 2013-05-29: 2 [IU] via SUBCUTANEOUS
  Administered 2013-05-30: 4 [IU] via SUBCUTANEOUS
  Administered 2013-06-01: 2 [IU] via SUBCUTANEOUS

## 2013-05-29 MED ORDER — IPRATROPIUM BROMIDE 0.02 % IN SOLN
0.5000 mg | Freq: Four times a day (QID) | RESPIRATORY_TRACT | Status: DC
  Administered 2013-05-29 – 2013-05-30 (×3): 0.5 mg via RESPIRATORY_TRACT
  Filled 2013-05-29 (×3): qty 2.5

## 2013-05-29 MED ORDER — ATORVASTATIN CALCIUM 10 MG PO TABS
10.0000 mg | ORAL_TABLET | Freq: Every day | ORAL | Status: DC
  Administered 2013-05-29 – 2013-06-02 (×5): 10 mg via ORAL
  Filled 2013-05-29 (×6): qty 1

## 2013-05-29 MED ORDER — DIGOXIN 0.25 MG/ML IJ SOLN
0.2500 mg | Freq: Once | INTRAMUSCULAR | Status: AC
  Administered 2013-05-29: 0.25 mg via INTRAVENOUS
  Filled 2013-05-29: qty 1

## 2013-05-29 MED ORDER — BENZONATATE 100 MG PO CAPS
100.0000 mg | ORAL_CAPSULE | Freq: Three times a day (TID) | ORAL | Status: DC | PRN
  Administered 2013-05-29 – 2013-05-30 (×2): 100 mg via ORAL
  Filled 2013-05-29 (×2): qty 1

## 2013-05-29 MED ORDER — LEVOFLOXACIN IN D5W 750 MG/150ML IV SOLN: 750.0000 mg | INTRAVENOUS | Status: DC

## 2013-05-29 MED ORDER — INSULIN ASPART 100 UNIT/ML ~~LOC~~ SOLN
0.0000 [IU] | Freq: Three times a day (TID) | SUBCUTANEOUS | Status: DC
  Administered 2013-05-29: 8 [IU] via SUBCUTANEOUS
  Administered 2013-05-30 (×3): 5 [IU] via SUBCUTANEOUS
  Administered 2013-05-31: 8 [IU] via SUBCUTANEOUS
  Administered 2013-05-31: 15 [IU] via SUBCUTANEOUS
  Administered 2013-05-31 – 2013-06-01 (×2): 5 [IU] via SUBCUTANEOUS
  Administered 2013-06-01: 11 [IU] via SUBCUTANEOUS
  Administered 2013-06-02 (×2): 2 [IU] via SUBCUTANEOUS

## 2013-05-29 MED ORDER — LEVOFLOXACIN IN D5W 750 MG/150ML IV SOLN
750.0000 mg | INTRAVENOUS | Status: DC
  Administered 2013-05-29 – 2013-05-31 (×2): 750 mg via INTRAVENOUS
  Filled 2013-05-29 (×4): qty 150

## 2013-05-29 MED ORDER — BUDESONIDE 0.25 MG/2ML IN SUSP
0.2500 mg | Freq: Two times a day (BID) | RESPIRATORY_TRACT | Status: DC
  Administered 2013-05-29 – 2013-06-03 (×9): 0.25 mg via RESPIRATORY_TRACT
  Filled 2013-05-29 (×13): qty 2

## 2013-05-29 MED ORDER — LEVALBUTEROL HCL 0.63 MG/3ML IN NEBU
0.6300 mg | INHALATION_SOLUTION | Freq: Four times a day (QID) | RESPIRATORY_TRACT | Status: DC
  Administered 2013-05-29 – 2013-05-30 (×3): 0.63 mg via RESPIRATORY_TRACT
  Filled 2013-05-29 (×5): qty 3

## 2013-05-29 MED ORDER — HYDROCOD POLST-CHLORPHEN POLST 10-8 MG/5ML PO LQCR: 5.0000 mL | Freq: Two times a day (BID) | ORAL | Status: DC | PRN

## 2013-05-29 NOTE — Progress Notes (Signed)
Subjective:  The patient is still very dyspneic.  No chest pain. Troponins are negative.  Objective:  Vital Signs in the last 24 hours: Temp:  [96.7 F (35.9 C)-97.8 F (36.6 C)] 97.3 F (36.3 C) (06/07 0800) Pulse Rate:  [70-112] 100 (06/07 1119) Resp:  [14-33] 16 (06/07 0700) BP: (89-151)/(42-92) 96/52 mmHg (06/07 1119) SpO2:  [77 %-100 %] 100 % (06/07 0837) Weight:  [153 lb 14.1 oz (69.8 kg)] 153 lb 14.1 oz (69.8 kg) (06/06 2210)  Intake/Output from previous day: 06/06 0701 - 06/07 0700 In: 550.6 [P.O.:240; I.V.:110.6; IV Piggyback:200] Out: 400 [Urine:400] Intake/Output from this shift: Total I/O In: -  Out: 125 [Urine:125]  . apixaban  2.5 mg Oral BID  . budesonide  0.25 mg Nebulization BID  . cholecalciferol  1,000 Units Oral Daily  . digoxin  0.25 mg Intravenous Once  . escitalopram  10 mg Oral Daily  . furosemide  40 mg Intravenous Q6H  . insulin aspart  0-9 Units Subcutaneous TID WC  . insulin glargine  15 Units Subcutaneous QHS  . levalbuterol  0.63 mg Nebulization Q6H   And  . ipratropium  0.5 mg Nebulization Q6H  . latanoprost  1 drop Both Eyes QHS  . levofloxacin (LEVAQUIN) IV  750 mg Intravenous Q48H  . metoprolol tartrate  25 mg Oral BID  . potassium chloride SA  40 mEq Oral Daily  . simvastatin  20 mg Oral QPM  . sodium chloride  3 mL Intravenous Q12H  . sodium chloride  3 mL Intravenous Q12H  . timolol  1 drop Both Eyes BID   . diltiazem (CARDIZEM) infusion 2.5 mg/hr (05/29/13 0700)    Physical Exam: The patient appears to be in moderate distress.  Head and neck exam reveals that the pupils are equal and reactive.  The extraocular movements are full.  There is no scleral icterus.  Mouth and pharynx are benign.  No lymphadenopathy.  No carotid bruits.  The jugular venous pressure is normal.  Thyroid is not enlarged or tender.  Chest reveals moderate expiratory wheezes consistent with cardiac asthma.  Heart irregular. No S3 gallop  The  abdomen is soft and nontender.  Bowel sounds are normoactive.  There is no hepatosplenomegaly or mass.  There are no abdominal bruits.  Extremities moderate pretibial edema.  Neurologic exam is normal strength and no lateralizing weakness.  No sensory deficits.  Integument reveals no rash  Lab Results:  Recent Labs  05/28/13 1752 05/29/13 0520  WBC 9.8 6.1  HGB 11.9* 11.1*  PLT 172 138*    Recent Labs  05/28/13 1752 05/29/13 0520  NA 138 135  K 4.7 4.4  CL 99 96  CO2 27 23  GLUCOSE 202* 329*  BUN 42* 49*  CREATININE 1.57* 1.47*    Recent Labs  05/28/13 2225 05/29/13 0520  TROPONINI <0.30 <0.30   Hepatic Function Panel  Recent Labs  05/29/13 0520  PROT 6.4  ALBUMIN 3.1*  AST 27  ALT 22  ALKPHOS 55  BILITOT 0.7   No results found for this basename: CHOL,  in the last 72 hours No results found for this basename: PROTIME,  in the last 72 hours  Imaging: Dg Chest 2 View  05/28/2013   *RADIOLOGY REPORT*  Clinical Data: Short of breath.  CHEST - 2 VIEW  Comparison: 03/27/2013.  Findings: Cardiomegaly and interstitial pulmonary edema are present.  Calcification over the right lung base has moved compared to the prior exam, likely within the  right breast.  Probable tiny bilateral pleural effusions.  Aortic arch atherosclerosis. Monitoring leads are projected over the chest. Faint Kerley B lines are noted in the periphery of the bases.  IMPRESSION: Mild CHF with cardiomegaly and interstitial pulmonary edema.   Original Report Authenticated By: Andreas Newport, M.D.    Cardiac Studies: 2D echo in March 2014 showed EF 60-65%  Telemetry shows Atrial fib with RVR Assessment/Plan:  1. AF with RVR 2. A/C Diastolic HF  Plan: BP soft. Agree with plans to try digoxin for additional rate control.   LOS: 1 day    Cassell Clement 05/29/2013, 11:20 AM

## 2013-05-29 NOTE — Progress Notes (Signed)
Patient ID: Morgan Morris  female  DGU:[id number]    DOB: 1920-09-08    DOA: 05/28/2013  PCP: Juline Patch, MD  Assessment/Plan: Principal Problem:   Atrial fibrillation with RVR - Heart rate still in 110-120's at the time of my examination, on Cardizem drip and oral Lopressor, still dyspneic - Gave digoxin 0.25 mg IV x1, will repeat in 6 hours if still not well controlled. If HR <100,place on oral digoxin in AM, dig level in AM - on eliquis,  troponins x2 negative  - Cardiology following  Active Problems:   Anemia - Monitor CBC    CKD (chronic kidney disease), stage III:  - Creatinine better from yesterday, on Lasix, monitor creatinine function closely    UTI (lower urinary tract infection) - Follow urine cultures, continue IV Levaquin     Acute diastolic CHF (congestive heart failure) - Continue IV Lasix, negative balance this shift    Diabetes mellitus - Change to moderate sliding scale, Lantus    Acute bronchitis - Placed on Xopenex and Atrovent nebs every 6 hours, Pulmicort, Levaquin, supplementation   DVT Prophylaxis:  Code Status: DNR/DNI  Disposition: Discussed in detail with patient's son at the bedside, he explained that patient has been going downhill since April this year. She had lost her husband 3 years ago and has since 'given up'. Per, patient's son, she had requested a DNR/DNI status which I changed this morning. Patient's son also requested that if she does not improve in next 2-3 days, he will be open to comfort care.  Subjective: Still struggling, dyspneic, hearing deficit, heart rate still not well controlled on Cardizem drip  Objective: Weight change:   Intake/Output Summary (Last 24 hours) at 05/29/13 1356 Last data filed at 05/29/13 1320  Gross per 24 hour  Intake 553.64 ml  Output    675 ml  Net -121.36 ml   Blood pressure 125/68, pulse 100, temperature 97.6 F (36.4 C), temperature source Oral, resp. rate 20, height 5\' 2"  (1.575 m), weight  69.8 kg (153 lb 14.1 oz), SpO2 95.00%.  Physical Exam: General: Alert and awake,  HEENT: anicteric sclera, PERLA, EOMI CVS: Irregularly irregular, tachycardia  Chest: Coarse breath sounds bilaterally with wheezing  Abdomen: soft nontender, nondistended, normal bowel sounds  Extremities: no cyanosis, clubbing or edema noted bilaterally   Lab Results: Basic Metabolic Panel:  Recent Labs Lab 05/28/13 1752 05/29/13 0520  NA 138 135  K 4.7 4.4  CL 99 96  CO2 27 23  GLUCOSE 202* 329*  BUN 42* 49*  CREATININE 1.57* 1.47*  CALCIUM 9.5 8.8   Liver Function Tests:  Recent Labs Lab 05/29/13 0520  AST 27  ALT 22  ALKPHOS 55  BILITOT 0.7  PROT 6.4  ALBUMIN 3.1*   CBC:  Recent Labs Lab 05/28/13 1752 05/29/13 0520  WBC 9.8 6.1  NEUTROABS  --  5.6  HGB 11.9* 11.1*  HCT 36.3 34.3*  MCV 91.0 90.7  PLT 172 138*   Cardiac Enzymes:  Recent Labs Lab 05/28/13 2225 05/29/13 0520 05/29/13 1045  TROPONINI <0.30 <0.30 <0.30   CBG:  Recent Labs Lab 05/28/13 1857 05/28/13 2313 05/29/13 0739 05/29/13 1138  GLUCAP 198* 266* 289* 398*     Micro Results: Recent Results (from the past 240 hour(s))  MRSA PCR SCREENING     Status: None   Collection Time    05/29/13  1:24 AM      Result Value Range Status   MRSA by PCR NEGATIVE  NEGATIVE  Final   Comment:            The GeneXpert MRSA Assay (FDA     approved for NASAL specimens     only), is one component of a     comprehensive MRSA colonization     surveillance program. It is not     intended to diagnose MRSA     infection nor to guide or     monitor treatment for     MRSA infections.    Studies/Results: Dg Chest 2 View  05/28/2013   *RADIOLOGY REPORT*  Clinical Data: Short of breath.  CHEST - 2 VIEW  Comparison: 03/27/2013.  Findings: Cardiomegaly and interstitial pulmonary edema are present.  Calcification over the right lung base has moved compared to the prior exam, likely within the right breast.  Probable  tiny bilateral pleural effusions.  Aortic arch atherosclerosis. Monitoring leads are projected over the chest. Faint Kerley B lines are noted in the periphery of the bases.  IMPRESSION: Mild CHF with cardiomegaly and interstitial pulmonary edema.   Original Report Authenticated By: Andreas Newport, M.D.    Medications: Scheduled Meds: . apixaban  2.5 mg Oral BID  . atorvastatin  10 mg Oral q1800  . budesonide  0.25 mg Nebulization BID  . cholecalciferol  1,000 Units Oral Daily  . escitalopram  10 mg Oral Daily  . furosemide  40 mg Intravenous Q6H  . insulin aspart  0-9 Units Subcutaneous TID WC  . insulin glargine  15 Units Subcutaneous QHS  . levalbuterol  0.63 mg Nebulization Q6H   And  . ipratropium  0.5 mg Nebulization Q6H  . latanoprost  1 drop Both Eyes QHS  . levofloxacin (LEVAQUIN) IV  750 mg Intravenous Q48H  . metoprolol tartrate  25 mg Oral BID  . potassium chloride SA  40 mEq Oral Daily  . sodium chloride  3 mL Intravenous Q12H  . sodium chloride  3 mL Intravenous Q12H  . timolol  1 drop Both Eyes BID      LOS: 1 day   RAI,RIPUDEEP M.D. [facility] 05/29/2013, 1:56 PM Pager: [phone]  If 7PM-7AM, please contact night-coverage [email or web address] Password [email or web address]

## 2013-05-29 NOTE — Progress Notes (Signed)
Patient has received one time dose of .25mg  of digoxin IV, per order.

## 2013-05-30 ENCOUNTER — Inpatient Hospital Stay (HOSPITAL_COMMUNITY): Payer: Medicare Other

## 2013-05-30 DIAGNOSIS — D649 Anemia, unspecified: Secondary | ICD-10-CM

## 2013-05-30 LAB — BASIC METABOLIC PANEL
CO2: 28 mEq/L (ref 19–32)
Chloride: 98 mEq/L (ref 96–112)
GFR calc Af Amer: 32 mL/min — ABNORMAL LOW (ref >90)
Potassium: 3.8 mEq/L (ref 3.5–5.1)
Sodium: 134 mEq/L — ABNORMAL LOW (ref 135–145)

## 2013-05-30 LAB — GLUCOSE, CAPILLARY
Glucose-Capillary: 209 mg/dL — ABNORMAL HIGH (ref 70–99)
Glucose-Capillary: 207 mg/dL — ABNORMAL HIGH (ref 70–99)

## 2013-05-30 LAB — URINE CULTURE: Colony Count: >=100000

## 2013-05-30 MED ORDER — METHYLPREDNISOLONE SODIUM SUCC 40 MG IJ SOLR
40.0000 mg | Freq: Three times a day (TID) | INTRAMUSCULAR | Status: DC
  Administered 2013-05-30 – 2013-05-31 (×5): 40 mg via INTRAVENOUS
  Filled 2013-05-30 (×7): qty 1

## 2013-05-30 MED ORDER — DILTIAZEM HCL 30 MG PO TABS
30.0000 mg | ORAL_TABLET | Freq: Four times a day (QID) | ORAL | Status: DC
  Administered 2013-05-30 – 2013-05-31 (×5): 30 mg via ORAL
  Filled 2013-05-30 (×8): qty 1

## 2013-05-30 MED ORDER — INSULIN GLARGINE 100 UNIT/ML ~~LOC~~ SOLN
18.0000 [IU] | Freq: Every day | SUBCUTANEOUS | Status: DC
  Administered 2013-05-30 – 2013-05-31 (×2): 18 [IU] via SUBCUTANEOUS
  Filled 2013-05-30 (×3): qty 0.18

## 2013-05-30 MED ORDER — IPRATROPIUM BROMIDE 0.02 % IN SOLN
0.5000 mg | RESPIRATORY_TRACT | Status: DC
  Administered 2013-05-30 – 2013-05-31 (×8): 0.5 mg via RESPIRATORY_TRACT
  Filled 2013-05-30 (×7): qty 2.5

## 2013-05-30 MED ORDER — FUROSEMIDE 10 MG/ML IJ SOLN
40.0000 mg | Freq: Two times a day (BID) | INTRAMUSCULAR | Status: DC
  Administered 2013-05-30 – 2013-05-31 (×2): 40 mg via INTRAVENOUS
  Filled 2013-05-30 (×2): qty 4

## 2013-05-30 MED ORDER — LEVALBUTEROL HCL 0.63 MG/3ML IN NEBU
0.6300 mg | INHALATION_SOLUTION | RESPIRATORY_TRACT | Status: DC
  Administered 2013-05-30 – 2013-05-31 (×8): 0.63 mg via RESPIRATORY_TRACT
  Filled 2013-05-30 (×12): qty 3

## 2013-05-30 MED ORDER — INSULIN ASPART 100 UNIT/ML ~~LOC~~ SOLN
4.0000 [IU] | Freq: Three times a day (TID) | SUBCUTANEOUS | Status: DC
  Administered 2013-05-30 – 2013-05-31 (×4): 4 [IU] via SUBCUTANEOUS

## 2013-05-30 NOTE — Progress Notes (Addendum)
Subjective:  The patient is less dyspneic and more alert this am. Heart rate has improved on digoxin, BB and IV cardizem. BP improved.  Objective:  Vital Signs in the last 24 hours: Temp:  [97.4 F (36.3 C)-97.9 F (36.6 C)] 97.4 F (36.3 C) (06/08 0700) Pulse Rate:  [30-245] 83 (06/08 0800) Resp:  [13-23] 15 (06/08 0800) BP: (80-130)/(35-88) 130/76 mmHg (06/08 0800) SpO2:  [90 %-100 %] 95 % (06/08 0833) Weight:  [160 lb 0.9 oz (72.6 kg)] 160 lb 0.9 oz (72.6 kg) (06/08 0304)  Intake/Output from previous day: 06/07 0701 - 06/08 0700 In: 372.7 [P.O.:340; I.V.:32.7] Out: 1325 [Urine:1325] Intake/Output from this shift:    . apixaban  2.5 mg Oral BID  . atorvastatin  10 mg Oral q1800  . budesonide  0.25 mg Nebulization BID  . cholecalciferol  1,000 Units Oral Daily  . digoxin  0.125 mg Oral Daily  . diltiazem  30 mg Oral Q6H  . escitalopram  10 mg Oral Daily  . furosemide  40 mg Intravenous Q6H  . insulin aspart  0-15 Units Subcutaneous TID WC  . insulin aspart  0-5 Units Subcutaneous QHS  . insulin aspart  4 Units Subcutaneous TID WC  . insulin glargine  18 Units Subcutaneous QHS  . levalbuterol  0.63 mg Nebulization Q4H   And  . ipratropium  0.5 mg Nebulization Q4H  . latanoprost  1 drop Both Eyes QHS  . levofloxacin (LEVAQUIN) IV  750 mg Intravenous Q48H  . methylPREDNISolone (SOLU-MEDROL) injection  40 mg Intravenous Q8H  . metoprolol tartrate  25 mg Oral BID  . potassium chloride SA  40 mEq Oral Daily  . sodium chloride  3 mL Intravenous Q12H  . sodium chloride  3 mL Intravenous Q12H  . timolol  1 drop Both Eyes BID      Physical Exam: The patient appears to be in moderate distress.  Head and neck exam reveals that the pupils are equal and reactive.  The extraocular movements are full.  There is no scleral icterus.  Mouth and pharynx are benign.  No lymphadenopathy.  No carotid bruits.  The jugular venous pressure is normal.  Thyroid is not enlarged or  tender.  Chest reveals moderate expiratory wheezes consistent with cardiac asthma.  Heart irregular. No S3 gallop  The abdomen is soft and nontender.  Bowel sounds are normoactive.  There is no hepatosplenomegaly or mass.  There are no abdominal bruits.  Extremities moderate pretibial edema.  Neurologic exam is normal strength and no lateralizing weakness.  No sensory deficits.  Integument reveals no rash  Lab Results:  Recent Labs  05/28/13 1752 05/29/13 0520  WBC 9.8 6.1  HGB 11.9* 11.1*  PLT 172 138*    Recent Labs  05/29/13 0520 05/30/13 0540  NA 135 134*  K 4.4 3.8  CL 96 98  CO2 23 28  GLUCOSE 329* 226*  BUN 49* 58*  CREATININE 1.47* 1.57*    Recent Labs  05/29/13 0520 05/29/13 1045  TROPONINI <0.30 <0.30   Hepatic Function Panel  Recent Labs  05/29/13 0520  PROT 6.4  ALBUMIN 3.1*  AST 27  ALT 22  ALKPHOS 55  BILITOT 0.7   No results found for this basename: CHOL,  in the last 72 hours No results found for this basename: PROTIME,  in the last 72 hours  Imaging: Dg Chest 2 View  05/28/2013   *RADIOLOGY REPORT*  Clinical Data: Short of breath.  CHEST - 2  VIEW  Comparison: 03/27/2013.  Findings: Cardiomegaly and interstitial pulmonary edema are present.  Calcification over the right lung base has moved compared to the prior exam, likely within the right breast.  Probable tiny bilateral pleural effusions.  Aortic arch atherosclerosis. Monitoring leads are projected over the chest. Faint Kerley B lines are noted in the periphery of the bases.  IMPRESSION: Mild CHF with cardiomegaly and interstitial pulmonary edema.   Original Report Authenticated By: Andreas Newport, M.D.    Cardiac Studies: 2D echo in March 2014 showed EF 60-65%  Telemetry shows Atrial fib with RVR Assessment/Plan:  1. AF with RVR, improved. 2. A/C Diastolic HF, improving.  Plan: Still moderate fluid overload. Weights appear to not be reliable.States weight up 7 lb since  admission. Creatinine higher.  Will change IV lasix to BID. recheck BMET in am. Will get followup chest xray today. Will switch to oral cardizem .   LOS: 2 days    Cassell Clement 05/30/2013, 10:34 AM

## 2013-05-30 NOTE — Progress Notes (Signed)
Patient ID: Morgan Morris  female  NWG:[id number]    DOB: 1920-09-13    DOA: 05/28/2013  PCP: Juline Patch, MD  Assessment/Plan: Principal Problem:   Atrial fibrillation with RVR - Heart rate improved in 85-low100's, still on Cardizem drip and oral Lopressor, digoxin  - on eliquis,  troponins x3 negative  - Cardiology following, ?change to oral cardizem  Active Problems:   CKD (chronic kidney disease), stage III:  - Creatinine trending up, on aggressive IV diuresis, may need to cut down, negative balance this shift (-801 cc), will defer to cardiology    UTI (lower urinary tract infection) - Urine Cx with multiple bacterial morphotypes, repeat urine culture pending, continue IV Levaquin     Acute diastolic CHF (congestive heart failure) - Continue IV Lasix, negative balance this shift, defer to cardiology for monitoring diuresis    Diabetes mellitus: uncontrolled in-patient - Change to moderate sliding scale, inc Lantus to 18units, added meal coverage    Acute bronchitis ?PNA - Inc Xopenex and Atrovent nebs every 4 hours, Pulmicort, Levaquin, O2,  -  add flutter valve, placed on IV steroids    Anemia - Monitor CBC  DVT Prophylaxis: eliquis, SCD's  Code Status: DNR/DNI  Disposition: Comfort care if doesnot improve in 2-3 days  Subjective: Still dyspneic, coughing, still on Cardizem drip @5mg /hr  Objective: Weight change: 2.8 kg (6 lb 2.8 oz)  Intake/Output Summary (Last 24 hours) at 05/30/13 0743 Last data filed at 05/30/13 0304  Gross per 24 hour  Intake 372.71 ml  Output   1325 ml  Net -952.29 ml   Blood pressure 108/69, pulse 138, temperature 97.4 F (36.3 C), temperature source Oral, resp. rate 14, height 5\' 2"  (1.575 m), weight 72.6 kg (160 lb 0.9 oz), SpO2 97.00%.  Physical Exam: General: Alert and awake, oriented, coughing CVS: Irregularly irregular, tachycardia  Chest: Coarse breath sounds bilaterally with wheezing, rattling  Abdomen: soft NT, ND,  NBS Extremities: no c/c/e bilaterally   Lab Results: Basic Metabolic Panel:  Recent Labs Lab 05/29/13 0520 05/30/13 0540  NA 135 134*  K 4.4 3.8  CL 96 98  CO2 23 28  GLUCOSE 329* 226*  BUN 49* 58*  CREATININE 1.47* 1.57*  CALCIUM 8.8 8.7   Liver Function Tests:  Recent Labs Lab 05/29/13 0520  AST 27  ALT 22  ALKPHOS 55  BILITOT 0.7  PROT 6.4  ALBUMIN 3.1*   CBC:  Recent Labs Lab 05/28/13 1752 05/29/13 0520  WBC 9.8 6.1  NEUTROABS  --  5.6  HGB 11.9* 11.1*  HCT 36.3 34.3*  MCV 91.0 90.7  PLT 172 138*   Cardiac Enzymes:  Recent Labs Lab 05/28/13 2225 05/29/13 0520 05/29/13 1045  TROPONINI <0.30 <0.30 <0.30   CBG:  Recent Labs Lab 05/29/13 0739 05/29/13 1138 05/29/13 1726 05/29/13 2138 05/30/13 0437  GLUCAP 289* 398* 290* 211* 191*     Micro Results: Recent Results (from the past 240 hour(s))  MRSA PCR SCREENING     Status: None   Collection Time    05/29/13  1:24 AM      Result Value Range Status   MRSA by PCR NEGATIVE  NEGATIVE Final   Comment:            The GeneXpert MRSA Assay (FDA     approved for NASAL specimens     only), is one component of a     comprehensive MRSA colonization     surveillance program. It is not  intended to diagnose MRSA     infection nor to guide or     monitor treatment for     MRSA infections.    Studies/Results: Dg Chest 2 View  05/28/2013   *RADIOLOGY REPORT*  Clinical Data: Short of breath.  CHEST - 2 VIEW  Comparison: 03/27/2013.  Findings: Cardiomegaly and interstitial pulmonary edema are present.  Calcification over the right lung base has moved compared to the prior exam, likely within the right breast.  Probable tiny bilateral pleural effusions.  Aortic arch atherosclerosis. Monitoring leads are projected over the chest. Faint Kerley B lines are noted in the periphery of the bases.  IMPRESSION: Mild CHF with cardiomegaly and interstitial pulmonary edema.   Original Report Authenticated By:  Andreas Newport, M.D.    Medications: Scheduled Meds: . apixaban  2.5 mg Oral BID  . atorvastatin  10 mg Oral q1800  . budesonide  0.25 mg Nebulization BID  . cholecalciferol  1,000 Units Oral Daily  . digoxin  0.125 mg Oral Daily  . escitalopram  10 mg Oral Daily  . furosemide  40 mg Intravenous Q6H  . insulin aspart  0-15 Units Subcutaneous TID WC  . insulin aspart  0-5 Units Subcutaneous QHS  . insulin aspart  4 Units Subcutaneous TID WC  . insulin glargine  18 Units Subcutaneous QHS  . levalbuterol  0.63 mg Nebulization Q4H   And  . ipratropium  0.5 mg Nebulization Q4H  . latanoprost  1 drop Both Eyes QHS  . levofloxacin (LEVAQUIN) IV  750 mg Intravenous Q48H  . methylPREDNISolone (SOLU-MEDROL) injection  40 mg Intravenous Q8H  . metoprolol tartrate  25 mg Oral BID  . potassium chloride SA  40 mEq Oral Daily  . sodium chloride  3 mL Intravenous Q12H  . sodium chloride  3 mL Intravenous Q12H  . timolol  1 drop Both Eyes BID      LOS: 2 days   Dezerae Freiberger M.D. [facility] 05/30/2013, 7:43 AM Pager: [phone]  If 7PM-7AM, please contact night-coverage [email or web address] Password [email or web address]

## 2013-05-31 LAB — BASIC METABOLIC PANEL
CO2: 30 mEq/L (ref 19–32)
Calcium: 8.7 mg/dL (ref 8.4–10.5)
Chloride: 97 mEq/L (ref 96–112)
GFR calc Af Amer: 36 mL/min — ABNORMAL LOW (ref >90)
Sodium: 137 mEq/L (ref 135–145)

## 2013-05-31 LAB — GLUCOSE, CAPILLARY
Glucose-Capillary: 353 mg/dL — ABNORMAL HIGH (ref 70–99)
Glucose-Capillary: 241 mg/dL — ABNORMAL HIGH (ref 70–99)
Glucose-Capillary: 153 mg/dL — ABNORMAL HIGH (ref 70–99)

## 2013-05-31 MED ORDER — IPRATROPIUM BROMIDE 0.02 % IN SOLN
0.5000 mg | Freq: Four times a day (QID) | RESPIRATORY_TRACT | Status: DC
  Administered 2013-05-31 – 2013-06-03 (×12): 0.5 mg via RESPIRATORY_TRACT
  Filled 2013-05-31 (×13): qty 2.5

## 2013-05-31 MED ORDER — DILTIAZEM HCL ER COATED BEADS 120 MG PO CP24
120.0000 mg | ORAL_CAPSULE | Freq: Every day | ORAL | Status: DC
  Administered 2013-05-31: 120 mg via ORAL
  Filled 2013-05-31 (×2): qty 1

## 2013-05-31 MED ORDER — INSULIN ASPART 100 UNIT/ML ~~LOC~~ SOLN
6.0000 [IU] | Freq: Three times a day (TID) | SUBCUTANEOUS | Status: DC
  Administered 2013-05-31: 6 [IU] via SUBCUTANEOUS

## 2013-05-31 MED ORDER — FUROSEMIDE 40 MG PO TABS
40.0000 mg | ORAL_TABLET | Freq: Two times a day (BID) | ORAL | Status: DC
  Administered 2013-05-31 – 2013-06-03 (×6): 40 mg via ORAL
  Filled 2013-05-31 (×8): qty 1

## 2013-05-31 MED ORDER — LEVALBUTEROL HCL 0.63 MG/3ML IN NEBU
0.6300 mg | INHALATION_SOLUTION | Freq: Four times a day (QID) | RESPIRATORY_TRACT | Status: DC
  Administered 2013-05-31 – 2013-06-03 (×12): 0.63 mg via RESPIRATORY_TRACT
  Filled 2013-05-31 (×18): qty 3

## 2013-05-31 NOTE — Progress Notes (Signed)
Received pt from 3300 via wheelchair accompanied by nurse tech. Awake alert oriented x3. HOH. Assisted to bed . sr up x3. Oriented to room and call bell. Telemetry on. VS taken.

## 2013-05-31 NOTE — Progress Notes (Signed)
Patient being transferred to [id number] via wheelchair. Have called report to [place], Charity fundraiser. Patient's some is aware of the new room number.

## 2013-05-31 NOTE — Progress Notes (Signed)
[facility] Progress Note [facility] TEAM 1 - Stepdown/ICU TEAM   DORIANNA MCKIVER JYN:[id number] DOB: Jan 03, 1920 DOA: 05/28/2013 PCP: Juline Patch, MD  Brief narrative: 79 y.o. female with known history of atrial fibrillation and CHF found to be short of breath.  When EMS arrived patient was given Solu-Medrol and nebulizers and initially was placed on CPAP. On arrival to the ER patient was found to be in A. fib with RVR with features consistent with CHF. Patient was started on Cardizem infusion after bolus with Lasix 40 mg IV. Patient denied any chest pain or fever chills productive cough. Denied abdominal pain dysuria diarrhea.   Assessment/Plan:  Chronic Atrial fibrillation with acute RVR Cardiology following - heart rate has improved on digoxin, BB and cardizem - remains on chronic Eliquis - Cards has decided to stop dig given age/renal dysfxn  Acute decompensation of chronic diastolic CHF EF measured March 2014 60-65% - diuresis being directed by Cardiology  Anemia Recheck CBC in a.m. - likely due to chronic kidney disease  CKD stage III crt holding stable/improving   UTI  Urine culture was not helpful in determining a specific pathogen - urinalysis was however convincing of a true urinary tract infection - will plan to complete a 7 day course of empiric antibiotic therapy  Diabetes mellitus type 2 - uncontrolled  CBG remains quite high - adjust tx plan and follow - discontinue steroids - A1c 6.8 this admit   LLL atx v/s infiltrate Noted via CXR - no wheezing on exam so will d/c steroids - Levaquin should provide adequate empiric coverage for both UTI and possible community-acquired pneumonia  Code Status: NO CODE Family Communication: No family present at time of exam Disposition Plan: transfer to tele bed  Consultants: Cardiology - [place]   Procedures: none  Antibiotics: Levaquin 6/7 >> Cipro 6/6 Rocephin 6/6    DVT  prophylaxis: Eliquis  HPI/Subjective: The patient is hard of hearing but alert and conversant.  She denies chest pain fevers chills nausea or vomiting.  She does feel dyspneic on exertion.  Objective: Blood pressure 105/43, pulse 82, temperature 97 F (36.1 C), temperature source Oral, resp. rate 14, height 5\' 2"  (1.575 m), weight 70.2 kg (154 lb 12.2 oz), SpO2 96.00%.  Intake/Output Summary (Last 24 hours) at 05/31/13 1434 Last data filed at 05/31/13 1050  Gross per 24 hour  Intake    252 ml  Output   2275 ml  Net  -2023 ml   Exam: General: No acute respiratory distress at rest Lungs: Left basilar crackles with good air movement throughout other fields with no wheeze Cardiovascular: Regular rate without appreciable murmur gallop or rub Abdomen: Nontender, nondistended, soft, bowel sounds positive, no rebound, no ascites, no appreciable mass Extremities: No significant cyanosis, clubbing, or edema bilateral lower extremities  Data Reviewed: Basic Metabolic Panel:  Recent Labs Lab 05/28/13 1752 05/29/13 0520 05/30/13 0540 05/31/13 0530  NA 138 135 134* 137  K 4.7 4.4 3.8 3.6  CL 99 96 98 97  CO2 27 23 28 30   GLUCOSE 202* 329* 226* 264*  BUN 42* 49* 58* 53*  CREATININE 1.57* 1.47* 1.57* 1.42*  CALCIUM 9.5 8.8 8.7 8.7   Liver Function Tests:  Recent Labs Lab 05/29/13 0520  AST 27  ALT 22  ALKPHOS 55  BILITOT 0.7  PROT 6.4  ALBUMIN 3.1*   CBC:  Recent Labs Lab 05/28/13 1752 05/29/13 0520  WBC 9.8 6.1  NEUTROABS  --  5.6  HGB 11.9* 11.1*  HCT 36.3 34.3*  MCV 91.0 90.7  PLT 172 138*   Cardiac Enzymes:  Recent Labs Lab 05/28/13 2225 05/29/13 0520 05/29/13 1045  TROPONINI <0.30 <0.30 <0.30   BNP (last 3 results)  Recent Labs  03/27/13 1004 03/31/13 0425 05/28/13 1752  PROBNP 3476.0* 2796.0* 16021.0*   CBG:  Recent Labs Lab 05/30/13 1218 05/30/13 1628 05/30/13 2145 05/31/13 0725 05/31/13 1230  GLUCAP 209* 207* 326* 270* 353*     Recent Results (from the past 240 hour(s))  URINE CULTURE     Status: None   Collection Time    05/28/13  7:06 PM      Result Value Range Status   Specimen Description URINE, CLEAN CATCH   Final   Special Requests NONE   Final   Culture  Setup Time 05/28/2013 20:12   Final   Colony Count >=100,000 COLONIES/ML   Final   Culture     Final   Value: Multiple bacterial morphotypes present, none predominant. Suggest appropriate recollection if clinically indicated.   Report Status 05/30/2013 FINAL   Final  MRSA PCR SCREENING     Status: None   Collection Time    05/29/13  1:24 AM      Result Value Range Status   MRSA by PCR NEGATIVE  NEGATIVE Final   Comment:            The GeneXpert MRSA Assay (FDA     approved for NASAL specimens     only), is one component of a     comprehensive MRSA colonization     surveillance program. It is not     intended to diagnose MRSA     infection nor to guide or     monitor treatment for     MRSA infections.     Studies:  Recent x-ray studies have been reviewed in detail by the Attending Physician  Scheduled Meds:  Scheduled Meds: . apixaban  2.5 mg Oral BID  . atorvastatin  10 mg Oral q1800  . budesonide  0.25 mg Nebulization BID  . cholecalciferol  1,000 Units Oral Daily  . diltiazem  120 mg Oral Daily  . escitalopram  10 mg Oral Daily  . furosemide  40 mg Intravenous Q12H  . insulin aspart  0-15 Units Subcutaneous TID WC  . insulin aspart  0-5 Units Subcutaneous QHS  . insulin aspart  4 Units Subcutaneous TID WC  . insulin glargine  18 Units Subcutaneous QHS  . levalbuterol  0.63 mg Nebulization Q4H   And  . ipratropium  0.5 mg Nebulization Q4H  . latanoprost  1 drop Both Eyes QHS  . levofloxacin (LEVAQUIN) IV  750 mg Intravenous Q48H  . methylPREDNISolone (SOLU-MEDROL) injection  40 mg Intravenous Q8H  . metoprolol tartrate  25 mg Oral BID  . potassium chloride SA  40 mEq Oral Daily  . sodium chloride  3 mL Intravenous Q12H   . sodium chloride  3 mL Intravenous Q12H  . timolol  1 drop Both Eyes BID   Time spent on care of this patient:   [facility] T  [facility]  [phone] Pager - Text Page per Loretha Stapler as per below:  On-Call/Text Page:      [email or web address]      password [email or web address]  If 7PM-7AM, please contact night-coverage [email or web address] Password [email or web address] 05/31/2013, 2:34 PM   LOS: 3 days

## 2013-05-31 NOTE — Progress Notes (Signed)
Inpatient Diabetes Program Recommendations  AACE/ADA: New Consensus Statement on Inpatient Glycemic Control (2013)  Target Ranges:  Prepandial:   less than 140 mg/dL      Peak postprandial:   less than 180 mg/dL (1-2 hours)      Critically ill patients:  140 - 180 mg/dL   Inpatient Diabetes Program Recommendations Insulin - Basal: consider increasing Lantus to 25 units during steroid therapy Thank you  Piedad Climes BSN, RN,CDE Inpatient Diabetes Coordinator [phone] (team pager)

## 2013-05-31 NOTE — Progress Notes (Signed)
Clinical Social Work Department CLINICAL SOCIAL WORK PLACEMENT NOTE 05/31/2013  Patient:  Morgan Morris, Morgan Morris  Account Number:  [id number] Admit date:  05/28/2013  Clinical Social Worker:  Theresia Bough, Theresia Majors  Date/time:  05/31/2013 03:22 PM  Clinical Social Work is seeking post-discharge placement for this patient at the following level of care:   SKILLED NURSING   (*CSW will update this form in Epic as items are completed)   05/31/2013  Patient/family provided with [facility] Department of Clinical Social Work's list of facilities offering this level of care within the geographic area requested by the patient (or if unable, by the patient's family).  05/31/2013  Patient/family informed of their freedom to choose among providers that offer the needed level of care, that participate in Medicare, Medicaid or managed care program needed by the patient, have an available bed and are willing to accept the patient.  05/31/2013  Patient/family informed of [facility]' ownership interest in [facility], as well as of the fact that they are under no obligation to receive care at this facility.  PASARR submitted to EDS on 05/31/2013 PASARR number received from EDS on   FL2 transmitted to all facilities in geographic area requested by pt/family on  05/31/2013 FL2 transmitted to all facilities within larger geographic area on   Patient informed that his/her managed care company has contracts with or will negotiate with  certain facilities, including the following:     Patient/family informed of bed offers received:   Patient chooses bed at  Physician recommends and patient chooses bed at    Patient to be transferred to  on   Patient to be transferred to facility by   The following physician request were entered in Epic:   Additional Comments: Theresia Bough, MSW, [employer] [phone]

## 2013-05-31 NOTE — Progress Notes (Signed)
Clinical Social Work Department BRIEF PSYCHOSOCIAL ASSESSMENT 05/31/2013  Patient:  Morgan Morris, Morgan Morris     Account Number:  [id number]     Admit date:  05/28/2013  Clinical Social Worker:  Lourdes Sledge  Date/Time:  05/31/2013 02:13 PM  Referred by:  Care Management  Date Referred:  05/31/2013 Referred for  SNF Placement   Other Referral:   Interview type:  Family Other interview type:   CSW completed assessment with pt son Emiah Pellicano [phone]    PSYCHOSOCIAL DATA Living Status:  FACILITY Admitted from facility:  [facility] Level of care:  Assisted Living Primary support name:  Shatoria Stooksbury [phone] Primary support relationship to patient:  CHILD, ADULT Degree of support available:   Pt son actively involved in pt care.    CURRENT CONCERNS Current Concerns  Post-Acute Placement   Other Concerns:    SOCIAL WORK ASSESSMENT / PLAN CSW informed that pt was admitted from [facility]F.    CSW informed that pt son, who is main support for pt had concerns about whether pt can return to her ALF. CSW spoke with pt son who expressed concern with pt returning to her level of care at the ALF. Pt son stated that if SNF placement was recommended he would prefer placement at [facility] unless the pt can return to a higher level within her ALF. CSW informed pt son that CSW would request a PT/OT eval to determine how much assistance pt will need at dc and whether pt could potentially return to a higher level at [facility]. Pt son very appreciative and agreeable to a SNF search.   Assessment/plan status:  Psychosocial Support/Ongoing Assessment of Needs Other assessment/ plan:   Information/referral to community resources:   A SNF search to be provided to son.    PATIENT'S/FAMILY'S RESPONSE TO PLAN OF CARE: Pt son very involved in pt care. Son requested assistance to determine best venue of care for pt at discharge.       Theresia Bough,  MSW, Theresia Majors [phone]

## 2013-05-31 NOTE — Progress Notes (Signed)
    Subjective:  No chest pain or dyspnea at rest. Wants to get out of hospital.  Objective:  Vital Signs in the last 24 hours: Temp:  [97.1 F (36.2 C)-98 F (36.7 C)] 97.8 F (36.6 C) (06/09 0722) Pulse Rate:  [43-82] 82 (06/09 1046) Resp:  [14-18] 14 (06/09 0308) BP: (80-120)/(43-75) 105/43 mmHg (06/09 1321) SpO2:  [89 %-97 %] 96 % (06/09 1210) Weight:  [70.2 kg (154 lb 12.2 oz)] 70.2 kg (154 lb 12.2 oz) (06/09 0308)  Intake/Output from previous day: 06/08 0701 - 06/09 0700 In: 960 [P.O.:960] Out: 3050 [Urine:3050]  Physical Exam: Pt is alert and oriented, elderly woman in NAD HEENT: normal Neck: JVP - modestly elevated Lungs: diffuse rhonchi CV: irregular without murmur or gallop Abd: soft, NT, Positive BS, no hepatomegaly Ext: no C/C/E, distal pulses intact and equal Skin: warm/dry no rash   Lab Results:  Recent Labs  05/28/13 1752 05/29/13 0520  WBC 9.8 6.1  HGB 11.9* 11.1*  PLT 172 138*    Recent Labs  05/30/13 0540 05/31/13 0530  NA 134* 137  K 3.8 3.6  CL 98 97  CO2 28 30  GLUCOSE 226* 264*  BUN 58* 53*  CREATININE 1.57* 1.42*    Recent Labs  05/29/13 0520 05/29/13 1045  TROPONINI <0.30 <0.30    Tele: Atrial fibrillation, rate-controlled  Assessment/Plan:  1. Atrial fibrillation with RVR, now rate-controlled 2. Acute on chronic diastolic CHF 3. Acute kidney injury, improved  Pt improved. Probably best to stop digoxin at her advanced age and renal insufficiency. Continue apixaban for anticoagulation. Continue metoprolol and cardizem for rate=-control. Will change dilt to long-acting prep.   Tonny Bollman, M.D. 05/31/2013, 1:30 PM

## 2013-05-31 NOTE — Progress Notes (Signed)
Utilization review completed.  

## 2013-06-01 LAB — BASIC METABOLIC PANEL
CO2: 31 mEq/L (ref 19–32)
Calcium: 8.6 mg/dL (ref 8.4–10.5)
Chloride: 98 mEq/L (ref 96–112)
Sodium: 138 mEq/L (ref 135–145)

## 2013-06-01 LAB — GLUCOSE, CAPILLARY: Glucose-Capillary: 112 mg/dL — ABNORMAL HIGH (ref 70–99)

## 2013-06-01 MED ORDER — INSULIN GLARGINE 100 UNIT/ML ~~LOC~~ SOLN
15.0000 [IU] | Freq: Every day | SUBCUTANEOUS | Status: DC
  Administered 2013-06-01 – 2013-06-02 (×2): 15 [IU] via SUBCUTANEOUS
  Filled 2013-06-01 (×3): qty 0.15

## 2013-06-01 MED ORDER — DILTIAZEM HCL ER COATED BEADS 180 MG PO CP24
180.0000 mg | ORAL_CAPSULE | Freq: Every day | ORAL | Status: DC
  Administered 2013-06-01 – 2013-06-03 (×3): 180 mg via ORAL
  Filled 2013-06-01 (×3): qty 1

## 2013-06-01 MED ORDER — METOPROLOL TARTRATE 12.5 MG HALF TABLET
12.5000 mg | ORAL_TABLET | Freq: Two times a day (BID) | ORAL | Status: DC
  Administered 2013-06-01 – 2013-06-03 (×5): 12.5 mg via ORAL
  Filled 2013-06-01 (×6): qty 1

## 2013-06-01 MED ORDER — LORAZEPAM 2 MG/ML IJ SOLN
0.2500 mg | Freq: Once | INTRAMUSCULAR | Status: AC
  Administered 2013-06-01: 0.25 mg via INTRAVENOUS
  Filled 2013-06-01: qty 1

## 2013-06-01 MED ORDER — POTASSIUM CHLORIDE CRYS ER 20 MEQ PO TBCR
40.0000 meq | EXTENDED_RELEASE_TABLET | Freq: Two times a day (BID) | ORAL | Status: DC
  Administered 2013-06-01 – 2013-06-03 (×5): 40 meq via ORAL
  Filled 2013-06-01 (×5): qty 2

## 2013-06-01 MED ORDER — INSULIN ASPART 100 UNIT/ML ~~LOC~~ SOLN
6.0000 [IU] | Freq: Three times a day (TID) | SUBCUTANEOUS | Status: DC
  Administered 2013-06-02 (×2): 6 [IU] via SUBCUTANEOUS

## 2013-06-01 NOTE — Evaluation (Signed)
Occupational Therapy Evaluation and Discharge Patient Details Name: Morgan Morris MRN: [id number] DOB: 1920/07/20 Today's Date: 06/01/2013 Time: 0832-0901 OT Time Calculation (min): 29 min  OT Assessment / Plan / Recommendation Clinical Impression  This 79 yo female admitted from ALF with Shortness of breath and tachycardia and found to be in A-fib with RVR and have a UTI presents to acute OT with problems below and will benefit from follow up OT at SNF. Will defer remainder of OT to this facility.    OT Assessment  Patient needs continued OT Services    Follow Up Recommendations  SNF    Barriers to Discharge Decreased caregiver support    Equipment Recommendations  None recommended by OT          Precautions / Restrictions Precautions Precautions: Fall Restrictions Weight Bearing Restrictions: No       ADL  Eating/Feeding: Set up;Performed Where Assessed - Eating/Feeding: Bed level Grooming: Set up;Supervision/safety;Performed;Brushing hair Where Assessed - Grooming: Supported sitting Upper Body Bathing: Simulated;Set up;Supervision/safety Where Assessed - Upper Body Bathing: Supported sitting Lower Body Bathing: Simulated Where Assessed - Lower Body Bathing: Supported sit to stand Upper Body Dressing: Moderate assistance Where Assessed - Upper Body Dressing: Unsupported sitting Lower Body Dressing: Maximal assistance Where Assessed - Lower Body Dressing: Supported sit to stand Toilet Transfer: Performed;Minimal assistance Toilet Transfer Method: Surveyor, minerals: Materials engineer and Hygiene: Performed;+1 Total assistance Where Assessed - Engineer, mining and Hygiene: Standing Equipment Used: Rolling walker;Gait belt Transfers/Ambulation Related to ADLs: Min A for all with RW    OT Diagnosis: Cognitive deficits;Generalized weakness  OT Problem List: Impaired balance (sitting and/or  standing);Decreased cognition;Decreased knowledge of use of DME or AE;Decreased safety awareness     Visit Information  Last OT Received On: 06/01/13 Assistance Needed: +2 (just to follow with chair) PT/OT Co-Evaluation/Treatment: Yes    Subjective Data  Subjective: I don't know my son knows all that and should be here soon. (in reference to asking where she was living)   Prior Functioning     Home Living Available Help at Discharge:  (staff ALF) Type of Home: Assisted living Home Access: Level entry Home Layout: One level Bathroom Shower/Tub:  (sponge baths) Bathroom Toilet: Handicapped height Bathroom Accessibility: Yes How Accessible: Accessible via walker Home Adaptive Equipment: Walker - rolling Prior Function Level of Independence: Needs assistance Needs Assistance: Dressing;Bathing;Toileting Bath:  (only to get in and out of shower) Dressing:  (Independent) Toileting:  (Independent) Meal Prep: Total Light Housekeeping: Total Able to Take Stairs?: No Driving: No Vocation: Retired Musician: HOH Dominant Hand: Right         Vision/Perception Vision - History Baseline Vision: Wears glasses all the time Patient Visual Report: No change from baseline   Cognition  Cognition Arousal/Alertness: Awake/alert Behavior During Therapy: WFL for tasks assessed/performed Overall Cognitive Status: Impaired/Different from baseline Area of Impairment: Memory General Comments: Pt with conflicting answers to PLOF questions. Son helped correct incorrect answers.    Extremity/Trunk Assessment Right Upper Extremity Assessment RUE ROM/Strength/Tone: Within functional levels Left Upper Extremity Assessment LUE ROM/Strength/Tone: Within functional levels     Mobility Bed Mobility Bed Mobility: Supine to Sit;Sitting - Scoot to Edge of Bed Supine to Sit: 5: Supervision;HOB flat;With rails Sitting - Scoot to Edge of Bed: 5: Supervision;With  rail Transfers Transfers: Sit to Stand;Stand to Sit Sit to Stand: 4: Min assist;With upper extremity assist;From bed Stand to Sit: 4: Min assist;With upper extremity assist;With  armrests;To chair/3-in-1 Details for Transfer Assistance: VCs for safe hand placement           End of Session OT - End of Session Equipment Utilized During Treatment: Gait belt Activity Tolerance:  (limited by urinary incontience) Patient left: in chair;with call bell/phone within reach Nurse Communication: Mobility status       Evette Georges [phone] 06/01/2013, 9:50 AM

## 2013-06-01 NOTE — Progress Notes (Addendum)
[facility] Progress Note Morgan TEAM [address]   Morgan Morris ZOX:[id number] DOB: 1920-02-03 DOA: 05/28/2013 PCP: Juline Patch, MD  Brief narrative: 79 y.o. female with known history of atrial fibrillation and CHF found to be Morgan Morris of breath.  When EMS arrived patient was given Solu-Medrol and nebulizers and initially was placed on CPAP. On arrival to the ER patient was found to be in A. fib with RVR with features consistent with CHF. Patient was started on Cardizem infusion after bolus with Lasix 40 mg IV. Patient denied any chest pain or fever chills productive cough. Denied abdominal pain dysuria diarrhea.   Assessment/Plan:  Chronic Atrial fibrillation with acute RVR - Cardiology following - heart rate has improved on digoxin, BB and cardizem  - Continue Eliquis  - Cards decided to stop dig given age/renal dysfxn -  Increasing Cardizem and decreasing beta blocker per cardiology recommendations today  Acute decompensation of chronic diastolic CHF EF measured March 2014 60-65%  - diuresis being directed by Cardiology  Anemia, stable, likely due to chronic kidney disease  CKD stage III, crt holding stable/improving   UTI, Urine culture was not helpful in determining a specific pathogen -  Complete a seven-day course of levofloxacin tomorrow   Diabetes mellitus type 2 - uncontrolled fingersticks trending down now that steroids have been discontinued at may be at risk for hypoglycemia in the next few days. -  Reduce to home Lantus dose -  Continue standing mealtime insulin -  Continue moderate dose sliding scale insulin, patient is normally on low-dose sliding-scale - A1c 6.8 this admit   LLL atx v/s infiltrate, Noted via CXR  - Continue Levaquin for community-acquired pneumonia x 7 days, last dose tomorrow  Hypokalemia, likely due to diuresis - Increased to twice a day potassium today and decrease back to her home dose tomorrow his potassium has improved  Code Status: NO  CODE Family Communication: No family present at time of exam Disposition Plan: Likely to skilled nursing, social work to arrange.  Monitor blood pressure and heart rates on telemetry for an additional 24 hours after making adjustments beta blocker and calcium channel blocker  Consultants: Cardiology - [place]   Procedures: none  Antibiotics: Levaquin 6/7 >> Cipro 6/6 Rocephin 6/6    DVT prophylaxis: Eliquis  HPI/Subjective: The patient is hard of hearing but alert and conversant.  She is asking to have some assistance to transfer from her chair back to the bed. She feels weak. She states that she has not had chest pain or shortness of breath, nausea, vomiting, diarrhea. She has been voiding frequently.  Objective: Blood pressure 109/68, pulse 74, temperature 98 F (36.7 C), temperature source Oral, resp. rate 18, height 5\' 2"  (1.575 m), weight 67.7 kg (149 lb 4 oz), SpO2 98.00%.  Intake/Output Summary (Last 24 hours) at 06/01/13 1124 Last data filed at 06/01/13 0947  Gross per 24 hour  Intake   1170 ml  Output    250 ml  Net    920 ml   Exam: General: Caucasian female, no acute distress HEENT: NCAT, moist mucous membranes, JVP to the preauricular area while sitting upright  Lungs:  Diminished at the bilateral bases with rales bilateral bases and no rhonchi or wheeze. No increased work of breathing Cardiovascular: IRRR, without appreciable murmur gallop or rub Abdomen: Nontender, nondistended, soft, bowel sounds positive, no rebound, no ascites, no appreciable mass Extremities: No significant cyanosis, clubbing. 1+ edema bilateral lower extremities  Data Reviewed: Basic Metabolic Panel:  Recent Labs Lab 05/28/13 1752 05/29/13 0520 05/30/13 0540 05/31/13 0530 06/01/13 0445  NA 138 135 134* 137 138  K 4.7 4.4 3.8 3.6 3.4*  CL 99 96 98 97 98  CO2 27 23 28 30 31   GLUCOSE 202* 329* 226* 264* 128*  BUN 42* 49* 58* 53* 52*  CREATININE 1.57* 1.47* 1.57* 1.42* 1.33*   CALCIUM 9.5 8.8 8.7 8.7 8.6   Liver Function Tests:  Recent Labs Lab 05/29/13 0520  AST 27  ALT 22  ALKPHOS 55  BILITOT 0.7  PROT 6.4  ALBUMIN 3.1*   CBC:  Recent Labs Lab 05/28/13 1752 05/29/13 0520  WBC 9.8 6.1  NEUTROABS  --  5.6  HGB 11.9* 11.1*  HCT 36.3 34.3*  MCV 91.0 90.7  PLT 172 138*   Cardiac Enzymes:  Recent Labs Lab 05/28/13 2225 05/29/13 0520 05/29/13 1045  TROPONINI <0.30 <0.30 <0.30   BNP (last 3 results)  Recent Labs  03/27/13 1004 03/31/13 0425 05/28/13 1752  PROBNP 3476.0* 2796.0* 16021.0*   CBG:  Recent Labs Lab 05/31/13 0725 05/31/13 1230 05/31/13 1700 05/31/13 2131 06/01/13 0630  GLUCAP 270* 353* 241* 153* 112*    Recent Results (from the past 240 hour(s))  URINE CULTURE     Status: None   Collection Time    05/28/13  7:06 PM      Result Value Range Status   Specimen Description URINE, CLEAN CATCH   Final   Special Requests NONE   Final   Culture  Setup Time 05/28/2013 20:12   Final   Colony Count >=100,000 COLONIES/ML   Final   Culture     Final   Value: Multiple bacterial morphotypes present, none predominant. Suggest appropriate recollection if clinically indicated.   Report Status 05/30/2013 FINAL   Final  MRSA PCR SCREENING     Status: None   Collection Time    05/29/13  1:24 AM      Result Value Range Status   MRSA by PCR NEGATIVE  NEGATIVE Final   Comment:            The GeneXpert MRSA Assay (FDA     approved for NASAL specimens     only), is one component of a     comprehensive MRSA colonization     surveillance program. It is not     intended to diagnose MRSA     infection nor to guide or     monitor treatment for     MRSA infections.     Studies:  Recent x-ray studies have been reviewed in detail by the Attending Physician  Scheduled Meds:  Scheduled Meds: . apixaban  2.5 mg Oral BID  . atorvastatin  10 mg Oral q1800  . budesonide  0.25 mg Nebulization BID  . cholecalciferol  1,000 Units  Oral Daily  . diltiazem  180 mg Oral Daily  . escitalopram  10 mg Oral Daily  . furosemide  40 mg Oral BID  . insulin aspart  0-15 Units Subcutaneous TID WC  . insulin aspart  0-5 Units Subcutaneous QHS  . insulin aspart  6 Units Subcutaneous TID WC  . insulin glargine  18 Units Subcutaneous QHS  . levalbuterol  0.63 mg Nebulization QID   And  . ipratropium  0.5 mg Nebulization QID  . latanoprost  1 drop Both Eyes QHS  . levofloxacin (LEVAQUIN) IV  750 mg Intravenous Q48H  . metoprolol tartrate  12.5 mg Oral BID  . potassium chloride  SA  40 mEq Oral BID  . sodium chloride  3 mL Intravenous Q12H  . timolol  1 drop Both Eyes BID   Time spent on care of this patient:   Renae Fickle Pager:  [phone]  [facility] Office  [phone] Pager - Text Page per Loretha Stapler as per below:  On-Call/Text Page:      [email or web address]      password [email or web address]  If 7PM-7AM, please contact night-coverage [email or web address] Password [email or web address] 06/01/2013, 11:24 AM   LOS: 4 days

## 2013-06-01 NOTE — Progress Notes (Signed)
Subjective:  Patient is less dyspneic.  Still has moderate wheezing. Rate controlled on BB and diltiazem. Objective:  Vital Signs in the last 24 hours: Temp:  [97 F (36.1 C)-98 F (36.7 C)] 98 F (36.7 C) (06/10 0453) Pulse Rate:  [74-82] 74 (06/10 0453) Resp:  [16-18] 18 (06/10 0453) BP: (102-109)/(39-68) 109/68 mmHg (06/10 0453) SpO2:  [93 %-100 %] 98 % (06/10 0743) Weight:  [149 lb 4 oz (67.7 kg)-149 lb 7.6 oz (67.8 kg)] 149 lb 4 oz (67.7 kg) (06/10 0453)  Intake/Output from previous day: 06/09 0701 - 06/10 0700 In: 1062 [P.O.:900; I.V.:12; IV Piggyback:150] Out: 975 [Urine:975] Intake/Output from this shift:    . apixaban  2.5 mg Oral BID  . atorvastatin  10 mg Oral q1800  . budesonide  0.25 mg Nebulization BID  . cholecalciferol  1,000 Units Oral Daily  . diltiazem  120 mg Oral Daily  . escitalopram  10 mg Oral Daily  . furosemide  40 mg Oral BID  . insulin aspart  0-15 Units Subcutaneous TID WC  . insulin aspart  0-5 Units Subcutaneous QHS  . insulin aspart  6 Units Subcutaneous TID WC  . insulin glargine  18 Units Subcutaneous QHS  . levalbuterol  0.63 mg Nebulization QID   And  . ipratropium  0.5 mg Nebulization QID  . latanoprost  1 drop Both Eyes QHS  . levofloxacin (LEVAQUIN) IV  750 mg Intravenous Q48H  . metoprolol tartrate  25 mg Oral BID  . potassium chloride SA  40 mEq Oral BID  . sodium chloride  3 mL Intravenous Q12H  . timolol  1 drop Both Eyes BID      Physical Exam: The patient appears to be in moderate distress.  Head and neck exam reveals that the pupils are equal and reactive.  The extraocular movements are full.  There is no scleral icterus.  Mouth and pharynx are benign.  No lymphadenopathy.  No carotid bruits.  The jugular venous pressure is normal.  Thyroid is not enlarged or tender.  Chest reveals moderate expiratory wheezing.  Heart irregular. No S3 gallop.  Gr 2/6 apical systolic murmur.  The abdomen is soft and nontender.   Bowel sounds are normoactive.  There is no hepatosplenomegaly or mass.  There are no abdominal bruits.  Extremities mild pretibial edema.  Neurologic exam is normal strength and no lateralizing weakness.  No sensory deficits.  Integument reveals no rash  Lab Results: No results found for this basename: WBC, HGB, PLT,  in the last 72 hours  Recent Labs  05/31/13 0530 06/01/13 0445  NA 137 138  K 3.6 3.4*  CL 97 98  CO2 30 31  GLUCOSE 264* 128*  BUN 53* 52*  CREATININE 1.42* 1.33*    Recent Labs  05/29/13 1045  TROPONINI <0.30   Hepatic Function Panel No results found for this basename: PROT, ALBUMIN, AST, ALT, ALKPHOS, BILITOT, BILIDIR, IBILI,  in the last 72 hours No results found for this basename: CHOL,  in the last 72 hours No results found for this basename: PROTIME,  in the last 72 hours  Imaging: Dg Chest 2 View  05/30/2013   *RADIOLOGY REPORT*  Clinical Data: Wheezing and CHF.  CHEST - 2 VIEW  Comparison: 05/28/2013  Findings: The heart is enlarged.  There is mild interstitial edema. Left base atelectasis or infiltrate partially obscures the hemidiaphragm and is best seen in the lateral view, posterior in location.  There are small bilateral pleural  effusions.  The at the left lung base, there has been slight increase in focal area of atelectasis or developing infiltrate.  Again noted is coarse right breast calcification, overlying the right lung base.  IMPRESSION:  1.  Cardiomegaly and mild edema. 2.  Increased left lower lobe atelectasis or infiltrate.   Original Report Authenticated By: Norva Pavlov, M.D.    Cardiac Studies: 2D echo in March 2014 showed EF 60-65%  Telemetry shows Atrial fib with RVR Assessment/Plan:  1. AF with controlled ventricular response 2. A/C Diastolic HF, improved. 3. Acute renal injury, improving  Plan: With persistent wheezing will reduce dose of BB and increase dose of diltiazem.   LOS: 4 days    Cassell Clement 06/01/2013,  8:00 AM

## 2013-06-01 NOTE — Progress Notes (Signed)
Pt seen getting out of bed suddenly became confused and agitated , with paranoid thoughts. "everybody is against me".pt screaming. Charge nurse aware and house coverage [place] on floor . Pts son called to come over and updated with his mothers condition. Son came to floor and with Mom in the room . meds given as ordered by tri[facility] kAREN kIRBY. Pt calmer but still confused and paranoid. Rest of meds given . Observed pt closely

## 2013-06-01 NOTE — Evaluation (Signed)
Physical Therapy Evaluation Patient Details Name: Morgan Morris MRN: [id number] DOB: [birth year] Today's Date: 06/01/2013 Time: 4098-1191 PT Time Calculation (min): 25 min  PT Assessment / Plan / Recommendation Clinical Impression  Pt is a 79 yo female with Afib and quesitonable memory status. According to pt, she required assistance for mobility, bathing, dressing, cooking and cleaning. Per son report, pt was basically independent with RW for all ADLs with supervision for showering. Pt was refusing the available help at the ALF for getting to the cafeteria, choosing to ambulate with RW independently. At this time, pt requires minA for transfers and minG for ambulation. Pt with limited safety awareness, not reaching for a chair or pushing off from a stable surface. Recommend SNF at d/c to work on improved safety awareness and improved independence with ADLs. Son was consulted and agrees with this recommendation.       PT Assessment  Patient needs continued PT services    Follow Up Recommendations  SNF    Does the patient have the potential to tolerate intense rehabilitation      Barriers to Discharge        Equipment Recommendations       Recommendations for Other Services     Frequency Min 3X/week    Precautions / Restrictions Precautions Precautions: Fall Restrictions Weight Bearing Restrictions: No   Pertinent Vitals/Pain 0/10      Mobility  Bed Mobility Bed Mobility: Supine to Sit;Sitting - Scoot to Edge of Bed Supine to Sit: 5: Supervision;HOB flat;With rails Sitting - Scoot to Edge of Bed: 5: Supervision;With rail Transfers Sit to Stand: 4: Min assist;With upper extremity assist;From bed Stand to Sit: 4: Min assist;With upper extremity assist;With armrests;To chair/3-in-1 Details for Transfer Assistance: VCs for safe hand placement Ambulation/Gait Ambulation/Gait Assistance: 4: Min guard Ambulation Distance (Feet): 4 Feet Assistive device: Rolling  walker Ambulation/Gait Assistance Details: Pt walking in circle to chair Gait Pattern: Within Functional Limits Gait velocity: wfl for age    Exercises     PT Diagnosis: Generalized weakness;Difficulty walking  PT Problem List: Decreased activity tolerance;Decreased mobility;Decreased cognition;Decreased safety awareness PT Treatment Interventions: DME instruction;Gait training;Functional mobility training;Therapeutic activities;Therapeutic exercise;Balance training;Neuromuscular re-education   PT Goals Acute Rehab PT Goals PT Goal Formulation: With patient Time For Goal Achievement: 06/15/13 Potential to Achieve Goals: Good Pt will go Supine/Side to Sit: with modified independence;with HOB 0 degrees PT Goal: Supine/Side to Sit - Progress: Goal set today Pt will go Sit to Stand: with supervision;with upper extremity assist PT Goal: Sit to Stand - Progress: Goal set today Pt will Transfer Bed to Chair/Chair to Bed: with min assist PT Transfer Goal: Bed to Chair/Chair to Bed - Progress: Goal set today Pt will Ambulate: with least restrictive assistive device;>150 feet;with min assist PT Goal: Ambulate - Progress: Goal set today Pt will Perform Home Exercise Program: with supervision, verbal cues required/provided PT Goal: Perform Home Exercise Program - Progress: Goal set today  Visit Information  Last PT Received On: 06/01/13 Assistance Needed: +2 (just to follow with chair) PT/OT Co-Evaluation/Treatment: Yes    Subjective Data  Subjective: Pt recieved in bed saying everytime she caughs she wets herself.    Prior Functioning  Home Living Lives With: Alone Available Help at Discharge:  (staff ALF) Type of Home: Assisted living Home Access: Level entry Home Layout: One level Bathroom Shower/Tub:  (sponge baths) Bathroom Toilet: Handicapped height Bathroom Accessibility: Yes How Accessible: Accessible via walker Home Adaptive Equipment: Walker - rolling Additional Comments:  Pt reports sponge baths but son reports pt has never had sponge bath, on further questioning pt says,"I cant remember" Prior Function Level of Independence: Needs assistance Needs Assistance: Dressing;Bathing;Toileting Bath:  (only to get in and out of shower) Dressing:  (Independent) Toileting:  (Independent) Meal Prep: Total Light Housekeeping: Total Able to Take Stairs?: No Driving: No 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 Son helped correct incorrect answers.    Extremity/Trunk Assessment Right Upper Extremity Assessment RUE ROM/Strength/Tone: Within functional levels Left Upper Extremity Assessment LUE ROM/Strength/Tone: Within functional levels Right Lower Extremity Assessment RLE ROM/Strength/Tone: [facility] for tasks assessed Left Lower Extremity Assessment LLE ROM/Strength/Tone: WFL for tasks assessed   Balance    End of Session PT - End of Session Equipment Utilized During Treatment: Gait belt Activity Tolerance: Patient tolerated treatment well Patient left: in chair;with call bell/phone within reach;with family/visitor present Nurse Communication: Mobility status (cognitive status, pt in depends for ambulation)  GP    06/01/2013, 10:36 AM Marvis Moeller, Student Physical Therapist Office #: [phone]

## 2013-06-01 NOTE — Evaluation (Signed)
Physical Therapy Evaluation Patient Details Name: Morgan Morris MRN: [id number] DOB: [birth year] Today's Date: 06/01/2013 Time: 8295-6213 PT Time Calculation (min): 25 min  PT Assessment / Plan / Recommendation Clinical Impression  Pt is a 79 yo female with Afib and quesitonable memory status. According to pt, she required assistance for mobility, bathing, dressing, cooking and cleaning. Per son report, pt was basically independent with RW for all ADLs with supervision for showering. Pt was refusing the available help at the ALF for getting to the cafeteria, choosing to ambulate with RW independently. At this time, pt requires minA for transfers and minG for ambulation. Pt with limited safety awareness, not reaching for a chair or pushing off from a stable surface. Recommend SNF at d/c to work on improved safety awareness and improved independence with ADLs. Son was consulted and agrees with this recommendation.       PT Assessment  Patient needs continued PT services    Follow Up Recommendations  SNF    Does the patient have the potential to tolerate intense rehabilitation      Barriers to Discharge        Equipment Recommendations       Recommendations for Other Services     Frequency Min 3X/week    Precautions / Restrictions Precautions Precautions: Fall Restrictions Weight Bearing Restrictions: No   Pertinent Vitals/Pain 0/10      Mobility  Bed Mobility Bed Mobility: Supine to Sit;Sitting - Scoot to Edge of Bed Supine to Sit: 5: Supervision;HOB flat;With rails Sitting - Scoot to Edge of Bed: 5: Supervision;With rail Transfers Sit to Stand: 4: Min assist;With upper extremity assist;From bed Stand to Sit: 4: Min assist;With upper extremity assist;With armrests;To chair/3-in-1 Details for Transfer Assistance: VCs for safe hand placement Ambulation/Gait Ambulation/Gait Assistance: 4: Min guard Ambulation Distance (Feet): 4 Feet Assistive device: Rolling  walker Ambulation/Gait Assistance Details: Pt walking in circle to chair Gait Pattern: Within Functional Limits Gait velocity: wfl for age    Exercises     PT Diagnosis: Generalized weakness;Difficulty walking  PT Problem List: Decreased activity tolerance;Decreased mobility;Decreased cognition;Decreased safety awareness PT Treatment Interventions: DME instruction;Gait training;Functional mobility training;Therapeutic activities;Therapeutic exercise;Balance training;Neuromuscular re-education   PT Goals Acute Rehab PT Goals PT Goal Formulation: With patient Time For Goal Achievement: 06/15/13 Potential to Achieve Goals: Good Pt will go Supine/Side to Sit: with modified independence;with HOB 0 degrees PT Goal: Supine/Side to Sit - Progress: Goal set today Pt will go Sit to Stand: with supervision;with upper extremity assist PT Goal: Sit to Stand - Progress: Goal set today Pt will Transfer Bed to Chair/Chair to Bed: with min assist PT Transfer Goal: Bed to Chair/Chair to Bed - Progress: Goal set today Pt will Ambulate: with least restrictive assistive device;>150 feet;with min assist PT Goal: Ambulate - Progress: Goal set today Pt will Perform Home Exercise Program: with supervision, verbal cues required/provided PT Goal: Perform Home Exercise Program - Progress: Goal set today  Visit Information  Last PT Received On: 06/01/13 Assistance Needed: +2 (just to follow with chair) PT/OT Co-Evaluation/Treatment: Yes    Subjective Data  Subjective: Pt recieved in bed saying everytime she caughs she wets herself.    Prior Functioning  Home Living Lives With: Alone Available Help at Discharge:  (staff ALF) Type of Home: Assisted living Home Access: Level entry Home Layout: One level Bathroom Shower/Tub:  (sponge baths) Bathroom Toilet: Handicapped height Bathroom Accessibility: Yes How Accessible: Accessible via walker Home Adaptive Equipment: Walker - rolling Additional Comments:  Pt reports sponge baths but son reports pt has never had sponge bath, on further questioning pt says,"I cant remember" Prior Function Level of Independence: Needs assistance Needs Assistance: Dressing;Bathing;Toileting Bath:  (only to get in and out of shower) Dressing:  (Independent) Toileting:  (Independent) Meal Prep: Total Light Housekeeping: Total Able to Take Stairs?: No Driving: No 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 Son helped correct incorrect answers.    Extremity/Trunk Assessment Right Upper Extremity Assessment RUE ROM/Strength/Tone: Within functional levels Left Upper Extremity Assessment LUE ROM/Strength/Tone: Within functional levels Right Lower Extremity Assessment RLE ROM/Strength/Tone: [facility] for tasks assessed Left Lower Extremity Assessment LLE ROM/Strength/Tone: WFL for tasks assessed   Balance    End of Session PT - End of Session Equipment Utilized During Treatment: Gait belt Activity Tolerance: Patient tolerated treatment well Patient left: in chair;with call bell/phone within reach;with family/visitor present Nurse Communication: Mobility status (cognitive status, pt in depends for ambulation)  GP    06/01/2013, 10:36 AM Marvis Moeller, Student Physical Therapist Office #: [phone]   Agree with above assessment.  Lewis Shock, PT, DPT Pager #: [phone] Office #: [phone]

## 2013-06-02 DIAGNOSIS — N390 Urinary tract infection, site not specified: Secondary | ICD-10-CM

## 2013-06-02 LAB — BASIC METABOLIC PANEL
CO2: 33 mEq/L — ABNORMAL HIGH (ref 19–32)
Calcium: 8.7 mg/dL (ref 8.4–10.5)
GFR calc Af Amer: 38 mL/min — ABNORMAL LOW (ref >90)
Sodium: 137 mEq/L (ref 135–145)

## 2013-06-02 LAB — GLUCOSE, CAPILLARY
Glucose-Capillary: 140 mg/dL — ABNORMAL HIGH (ref 70–99)
Glucose-Capillary: 95 mg/dL (ref 70–99)
Glucose-Capillary: 139 mg/dL — ABNORMAL HIGH (ref 70–99)

## 2013-06-02 MED ORDER — LEVOFLOXACIN 750 MG PO TABS
750.0000 mg | ORAL_TABLET | ORAL | Status: DC
  Filled 2013-06-02: qty 1

## 2013-06-02 MED ORDER — LEVOFLOXACIN 750 MG PO TABS
750.0000 mg | ORAL_TABLET | ORAL | Status: AC
  Administered 2013-06-02: 750 mg via ORAL
  Filled 2013-06-02: qty 1

## 2013-06-02 MED ORDER — DILTIAZEM HCL ER COATED BEADS 180 MG PO CP24: 180.0000 mg | ORAL_CAPSULE | Freq: Every day | ORAL | Status: AC

## 2013-06-02 MED ORDER — ATORVASTATIN CALCIUM 10 MG PO TABS: 10.0000 mg | ORAL_TABLET | Freq: Every day | ORAL | Status: DC

## 2013-06-02 MED ORDER — METOPROLOL TARTRATE 25 MG PO TABS: 12.5000 mg | ORAL_TABLET | Freq: Two times a day (BID) | ORAL | Status: DC

## 2013-06-02 NOTE — Discharge Summary (Addendum)
Physician Discharge Summary  Morgan Morris:[id number] DOB: 04/08/1920 DOA: 05/28/2013  PCP: Juline Patch, MD  Admit date: 05/28/2013 Discharge date: 06/02/2013  Time spent: Greater than 30 minutes  Recommendations for Outpatient Follow-up:  1. Dr. Juline Patch, PCP in 5 days with repeat labs (BMP & CBC). 2. Dr. Cassell Clement, Cardiology in one week. 3. Followup chest x-ray in a couple of weeks to ensure resolution of pneumonia findings.  Discharge Diagnoses:  Principal Problem:   Atrial fibrillation with RVR Active Problems:   Anemia   CKD (chronic kidney disease), stage III   UTI (lower urinary tract infection)   Acute diastolic CHF (congestive heart failure)   Diabetes mellitus   Acute bronchitis   Discharge Condition: Improved & Stable  Diet recommendation: Heart healthy and diabetic diet  Filed Weights   05/31/13 2058 06/01/13 0453 06/02/13 0436  Weight: 67.8 kg (149 lb 7.6 oz) 67.7 kg (149 lb 4 oz) 66.5 kg (146 lb 9.7 oz)    History of present illness:  79 y.o. female with known history of atrial fibrillation and CHF found to be short of breath. When EMS arrived patient was given Solu-Medrol and nebulizers and initially was placed on CPAP. On arrival to the ER patient was found to be in A. fib with RVR with features consistent with CHF. Patient was started on Cardizem infusion after bolus with Lasix 40 mg IV. Patient denied any chest pain or fever chills productive cough. Denied abdominal pain dysuria diarrhea.  Hospital Course:  1. Chronic A. fib with acute RVR: Patient was initially placed on Cardizem drip, digoxin and metoprolol was continued. Cardiology consulted. Digoxin was discontinued given advanced age and renal dysfunction. Patient was transitioned to oral Cardizem and metoprolol dose was reduced. Anti-coagulation with Eliquis was continued. Patient heart rate is adequately controlled. Discussed with cardiology who have cleared her for discharge. 2. Acute on  chronic diastolic CHF: EF in March 2014 was 60-65%. This was most likely precipitated by A. fib with RVR. Patient was diuresed with IV Lasix and has clinically done well. She will be discharged on her home dose of Lasix and Spironolactone. Outpatient followup with cardiology. Zocor was changed to Lipitor to avoid interaction with Cardizem. 3. Hypokalemia: Repleted. Continue spironolactone and potassium supplements. Followup BMP in 5 days. 4. UTI: Completed one-week course of antibiotics. Urine culture was suggestive of contamination. 5. Anemia: Possibly from chronic kidney disease. Stable. Mildly low platelets x1-unclear etiology. Follow CBC in 5 days. 6. Stage III chronic kidney disease: Stable. Follow BMP in 5 days. 7. Uncontrolled type 2 diabetes mellitus: Hemoglobin A1c 6.8. Continue home dose of Lantus and sliding scale insulin. Her insulins were increased in hospital while she was on steroids. However since steroids have been discontinued, CBG is a trending down and hence we'll discontinue scheduled mealtime NovoLog to avoid risk of hypoglycemia. This may be reconsidered at the nursing facility. 8. Left lower lobe atelectasis versus infiltrate versus all secondary to decompensated CHF: Patient completed her last dose of Levaquin today. Recommend repeating chest x-ray in a couple of weeks to ensure resolution of pneumonia findings. 9. Overnight confusion and agitation:? Underlying dementia. Patient was on Seroquel PTA-continue. Seems to have resolved this morning 10. DO NOT RESUSCITATE  Procedures:  None   Consultations:  [facility]  Discharge Exam:  Complaints: Patient complains of feeling slightly tired but denies dyspnea or pain anywhere. Overnight, patient was apparently confused and agitated with paranoid thoughts. However these seem to have resolved this morning.  Filed Vitals:   06/01/13 2006 06/01/13 2039 06/02/13 0436 06/02/13 0846  BP:  114/64 148/84   Pulse: 87 79  86   Temp:  98.1 F (36.7 C) 98.3 F (36.8 C)   TempSrc:  Oral Oral   Resp: 18 18 20    Height:      Weight:   66.5 kg (146 lb 9.7 oz)   SpO2: 94% 94% 96% 97%     General exam: Comfortable.Lying supine in bed.   Respiratory system:  occasional basal crackles but otherwise clear to auscultation. No increased work of breathing.  Cardiovascular system: S1 and S2 heard,  irregularly irregular. No JVD, murmurs. Trace pedal edema.Telemetry: A. fib with CVR.   Gastrointestinal system: Abdomen is nondistended, soft and nontender. Normal bowel sounds heard.  Central nervous system: Alert and oriented Person, place and partly to time. No focal neurological deficits.  Extremities: Symmetric 5 x 5 power.  Discharge Instructions      Discharge Orders   Future Orders Complete By Expires     (HEART FAILURE PATIENTS) Call MD:  Anytime you have any of the following symptoms: 1) 3 pound weight gain in 24 hours or 5 pounds in 1 week 2) shortness of breath, with or without a dry hacking cough 3) swelling in the hands, feet or stomach 4) if you have to sleep on extra pillows at night in order to breathe.  As directed     Call MD for:  difficulty breathing, headache or visual disturbances  As directed     Call MD for:  temperature >100.4  As directed     Diet - low sodium heart healthy  As directed     Diet Carb Modified  As directed     Increase activity slowly  As directed         Medication List    STOP taking these medications       lisinopril 2.5 MG tablet  Commonly known as:  PRINIVIL,ZESTRIL     simvastatin 20 MG tablet  Commonly known as:  ZOCOR      TAKE these medications       albuterol 108 (90 BASE) MCG/ACT inhaler  Commonly known as:  PROVENTIL HFA;VENTOLIN HFA  Inhale 2 puffs into the lungs every 6 (six) hours as needed for wheezing.     albuterol (2.5 MG/3ML) 0.083% nebulizer solution  Commonly known as:  PROVENTIL  Take 2.5 mg by nebulization every 6 (six) hours as  needed for wheezing.     apixaban 2.5 MG Tabs tablet  Commonly known as:  ELIQUIS  Take 1 tablet (2.5 mg total) by mouth 2 (two) times daily.     atorvastatin 10 MG tablet  Commonly known as:  LIPITOR  Take 1 tablet (10 mg total) by mouth daily at 6 PM.     b complex vitamins tablet  Take 1 tablet by mouth daily at 12 noon.     cholecalciferol 1000 UNITS tablet  Commonly known as:  VITAMIN D  Take 1,000 Units by mouth daily.     diltiazem 180 MG 24 hr capsule  Commonly known as:  CARDIZEM CD  Take 1 capsule (180 mg total) by mouth daily.     escitalopram 10 MG tablet  Commonly known as:  LEXAPRO  Take 10 mg by mouth daily.     furosemide 40 MG tablet  Commonly known as:  LASIX  Take 20-40 mg by mouth 2 (two) times daily. 40 mg in a.m. And  20 mg at noon.     insulin glargine 100 UNIT/ML injection  Commonly known as:  LANTUS  Inject 15 Units into the skin at bedtime.     insulin regular 100 units/mL injection  Commonly known as:  NOVOLIN R,HUMULIN R  Inject 0.04-0.25 mLs (4-25 Units total) into the skin 3 (three) times daily before meals. Per sliding scale:  0 - 120 = 0 units  121 - 150 =2 units  151 - 200 = 4 units  201 - 250 = 6 units  251 - 300 = 10 units  >300 = 12 units     latanoprost 0.005 % ophthalmic solution  Commonly known as:  XALATAN  Place 1 drop into both eyes at bedtime.     metoprolol tartrate 25 MG tablet  Commonly known as:  LOPRESSOR  Take 0.5 tablets (12.5 mg total) by mouth 2 (two) times daily.     OCUVITE PRESERVISION PO  Take 1 tablet by mouth 2 (two) times daily.     potassium chloride SA 20 MEQ tablet  Commonly known as:  K-DUR,KLOR-CON  Take 2 tablets (40 mEq total) by mouth daily.     QUEtiapine 25 MG tablet  Commonly known as:  SEROQUEL  Take 6.25 mg by mouth at bedtime.     spironolactone 25 MG tablet  Commonly known as:  ALDACTONE  Take 12.5 mg by mouth every morning.     timolol 0.5 % ophthalmic solution  Commonly  known as:  BETIMOL  Place 1 drop into both eyes 2 (two) times daily.       Follow-up Information   Follow up with PANG,RICHARD, MD. Schedule an appointment as soon as possible for a visit in 5 days. (To be seen with repeat labs (BMP))    Contact information:   [address] [place], [address] [place] [address] [phone]       Follow up with Cassell Clement, MD. Schedule an appointment as soon as possible for a visit in 1 week.   Contact information:   [address] [place] [address] [phone]        The results of significant diagnostics from this hospitalization (including imaging, microbiology, ancillary and laboratory) are listed below for reference.    Significant Diagnostic Studies: Dg Chest 2 View  05/30/2013   *RADIOLOGY REPORT*  Clinical Data: Wheezing and CHF.  CHEST - 2 VIEW  Comparison: 05/28/2013  Findings: The heart is enlarged.  There is mild interstitial edema. Left base atelectasis or infiltrate partially obscures the hemidiaphragm and is best seen in the lateral view, posterior in location.  There are small bilateral pleural effusions.  The at the left lung base, there has been slight increase in focal area of atelectasis or developing infiltrate.  Again noted is coarse right breast calcification, overlying the right lung base.  IMPRESSION:  1.  Cardiomegaly and mild edema. 2.  Increased left lower lobe atelectasis or infiltrate.   Original Report Authenticated By: Norva Pavlov, M.D.   Dg Chest 2 View  05/28/2013   *RADIOLOGY REPORT*  Clinical Data: Short of breath.  CHEST - 2 VIEW  Comparison: 03/27/2013.  Findings: Cardiomegaly and interstitial pulmonary edema are present.  Calcification over the right lung base has moved compared to the prior exam, likely within the right breast.  Probable tiny bilateral pleural effusions.  Aortic arch atherosclerosis. Monitoring leads are projected over the chest. Faint Kerley B lines are noted in the  periphery of the bases.  IMPRESSION: Mild CHF with cardiomegaly and interstitial pulmonary edema.   Original Report Authenticated By: Andreas Newport, M.D.    Microbiology: Recent Results (from the past 240 hour(s))  URINE CULTURE     Status: None   Collection Time    05/28/13  7:06 PM      Result Value Range Status   Specimen Description URINE, CLEAN CATCH   Final   Special Requests NONE   Final   Culture  Setup Time 05/28/2013 20:12   Final   Colony Count >=100,000 COLONIES/ML   Final   Culture     Final   Value: Multiple bacterial morphotypes present, none predominant. Suggest appropriate recollection if clinically indicated.   Report Status 05/30/2013 FINAL   Final  MRSA PCR SCREENING     Status: None   Collection Time    05/29/13  1:24 AM      Result Value Range Status   MRSA by PCR NEGATIVE  NEGATIVE Final   Comment:            The GeneXpert MRSA Assay (FDA     approved for NASAL specimens     only), is one component of a     comprehensive MRSA colonization     surveillance program. It is not     intended to diagnose MRSA     infection nor to guide or     monitor treatment for     MRSA infections.     Labs: Basic Metabolic Panel:  Recent Labs Lab 05/29/13 0520 05/30/13 0540 05/31/13 0530 06/01/13 0445 06/02/13 0915  NA 135 134* 137 138 137  K 4.4 3.8 3.6 3.4* 3.9  CL 96 98 97 98 95*  CO2 23 28 30 31  33*  GLUCOSE 329* 226* 264* 128* 140*  BUN 49* 58* 53* 52* 49*  CREATININE 1.47* 1.57* 1.42* 1.33* 1.38*  CALCIUM 8.8 8.7 8.7 8.6 8.7   Liver Function Tests:  Recent Labs Lab 05/29/13 0520  AST 27  ALT 22  ALKPHOS 55  BILITOT 0.7  PROT 6.4  ALBUMIN 3.1*   No results found for this basename: LIPASE, AMYLASE,  in the last 168 hours No results found for this basename: AMMONIA,  in the last 168 hours CBC:  Recent Labs Lab 05/28/13 1752 05/29/13 0520  WBC 9.8 6.1  NEUTROABS  --  5.6  HGB 11.9* 11.1*  HCT 36.3 34.3*  MCV 91.0 90.7  PLT 172 138*    Cardiac Enzymes:  Recent Labs Lab 05/28/13 2225 05/29/13 0520 05/29/13 1045  TROPONINI <0.30 <0.30 <0.30   BNP: BNP (last 3 results)  Recent Labs  03/27/13 1004 03/31/13 0425 05/28/13 1752  PROBNP 3476.0* 2796.0* 16021.0*   CBG:  Recent Labs Lab 06/01/13 1120 06/01/13 1633 06/01/13 2101 06/02/13 0623 06/02/13 1124  GLUCAP 246* 334* 237* 140* 95    Additional labs:  CBG on 6/11:95 and 140.  ABG on admission: PH 7.424, PCO2 39, PO2 133, bicarbonate 26 and oxygen saturation 99%.  Admitting BNP: 16,021  Hemoglobin A1c: 6.8   Signed:  Dorise Gangi  [facility] 06/02/2013, 12:05 PM

## 2013-06-02 NOTE — Progress Notes (Signed)
Subjective:  Patient had problem with confusion and paranoid ideation last night.  This am she appears to be back to baseline. No chest pain.  Less dyspneic. Objective:  Vital Signs in the last 24 hours: Temp:  [98.1 F (36.7 C)-98.3 F (36.8 C)] 98.3 F (36.8 C) (06/11 0436) Pulse Rate:  [76-87] 86 (06/11 0436) Resp:  [18-20] 20 (06/11 0436) BP: (114-148)/(58-84) 148/84 mmHg (06/11 0436) SpO2:  [92 %-96 %] 96 % (06/11 0436) Weight:  [146 lb 9.7 oz (66.5 kg)] 146 lb 9.7 oz (66.5 kg) (06/11 0436)  Intake/Output from previous day: 06/10 0701 - 06/11 0700 In: 900 [P.O.:900] Out: 201 [Urine:200; Stool:1] Intake/Output from this shift:    . apixaban  2.5 mg Oral BID  . atorvastatin  10 mg Oral q1800  . budesonide  0.25 mg Nebulization BID  . cholecalciferol  1,000 Units Oral Daily  . diltiazem  180 mg Oral Daily  . escitalopram  10 mg Oral Daily  . furosemide  40 mg Oral BID  . insulin aspart  0-15 Units Subcutaneous TID WC  . insulin aspart  0-5 Units Subcutaneous QHS  . insulin aspart  6 Units Subcutaneous TID WC  . insulin glargine  15 Units Subcutaneous QHS  . levalbuterol  0.63 mg Nebulization QID   And  . ipratropium  0.5 mg Nebulization QID  . latanoprost  1 drop Both Eyes QHS  . levofloxacin (LEVAQUIN) IV  750 mg Intravenous Q48H  . metoprolol tartrate  12.5 mg Oral BID  . potassium chloride SA  40 mEq Oral BID  . sodium chloride  3 mL Intravenous Q12H  . timolol  1 drop Both Eyes BID      Physical Exam: The patient appears to be in no distress.  Head and neck exam reveals that the pupils are equal and reactive.  The extraocular movements are full.  There is no scleral icterus.  Mouth and pharynx are benign.  No lymphadenopathy.  No carotid bruits.  The jugular venous pressure is normal.  Thyroid is not enlarged or tender.  Chest reveals scattered rhonchi. Wheezing is less.  Heart irregular. No S3 gallop.  Gr 2/6 apical systolic murmur.  The abdomen is  soft and nontender.  Bowel sounds are normoactive.  There is no hepatosplenomegaly or mass.  There are no abdominal bruits.  Extremities reveal no edema.  Neurologic exam is normal strength and no lateralizing weakness.  No sensory deficits.  Integument reveals no rash  Lab Results: No results found for this basename: WBC, HGB, PLT,  in the last 72 hours  Recent Labs  05/31/13 0530 06/01/13 0445  NA 137 138  K 3.6 3.4*  CL 97 98  CO2 30 31  GLUCOSE 264* 128*  BUN 53* 52*  CREATININE 1.42* 1.33*   No results found for this basename: TROPONINI, CK, MB,  in the last 72 hours Hepatic Function Panel No results found for this basename: PROT, ALBUMIN, AST, ALT, ALKPHOS, BILITOT, BILIDIR, IBILI,  in the last 72 hours No results found for this basename: CHOL,  in the last 72 hours No results found for this basename: PROTIME,  in the last 72 hours  Imaging: No results found.  Cardiac Studies: 2D echo in March 2014 showed EF 60-65%  Telemetry shows Atrial fib with controlled VR Assessment/Plan:  1. AF with controlled ventricular response 2. A/C Diastolic HF, improved. 3. Acute renal injury, improving  Plan: Will get BMET today. Continue present meds for now.  LOS: 5 days    Cassell Clement 06/02/2013, 8:05 AM

## 2013-06-02 NOTE — Progress Notes (Signed)
PHARMACIST - PHYSICIAN COMMUNICATION DR:   [place] CONCERNING: Antibiotic IV to Oral Route Change Policy  RECOMMENDATION: This patient is receiving levofloxacin by the intravenous route.  Based on criteria approved by the Pharmacy and Therapeutics Committee, the antibiotic(s) is/are being converted to the equivalent oral dose form(s).   DESCRIPTION: These criteria include:  Patient being treated for a respiratory tract infection, urinary tract infection, or cellulitis  The patient is not neutropenic and does not exhibit a GI malabsorption state  The patient is eating (either orally or via tube) and/or has been taking other orally administered medications for a least 24 hours  The patient is improving clinically and has a Tmax < 100.5  If you have questions about this conversion, please contact the Pharmacy Department  []  ( [phone] )   [x]  ( [phone] )  [place]  []  ( [phone] )  [facility] []  ( [phone] )  [facility]   Bonnita Newby, PharmD, BCPS Clinical Pharmacist Pager [phone]  

## 2013-06-03 DIAGNOSIS — E162 Hypoglycemia, unspecified: Secondary | ICD-10-CM | POA: Diagnosis not present

## 2013-06-03 DIAGNOSIS — E1169 Type 2 diabetes mellitus with other specified complication: Secondary | ICD-10-CM

## 2013-06-03 LAB — BASIC METABOLIC PANEL
Calcium: 9.1 mg/dL (ref 8.4–10.5)
GFR calc Af Amer: 41 mL/min — ABNORMAL LOW (ref >90)
GFR calc non Af Amer: 35 mL/min — ABNORMAL LOW (ref >90)
Glucose, Bld: 43 mg/dL — CL (ref 70–99)
Sodium: 140 mEq/L (ref 135–145)

## 2013-06-03 LAB — GLUCOSE, CAPILLARY: Glucose-Capillary: 49 mg/dL — ABNORMAL LOW (ref 70–99)

## 2013-06-03 MED ORDER — INSULIN ASPART 100 UNIT/ML ~~LOC~~ SOLN: 0.0000 [IU] | Freq: Three times a day (TID) | SUBCUTANEOUS | Status: DC

## 2013-06-03 MED ORDER — INSULIN GLARGINE 100 UNIT/ML ~~LOC~~ SOLN
12.0000 [IU] | Freq: Every day | SUBCUTANEOUS | Status: DC
  Filled 2013-06-03: qty 0.12

## 2013-06-03 MED ORDER — INSULIN GLARGINE 100 UNIT/ML ~~LOC~~ SOLN: 12.0000 [IU] | Freq: Every day | SUBCUTANEOUS | Status: DC

## 2013-06-03 NOTE — Progress Notes (Signed)
Hypoglycemic Event  CBG: 49  Treatment: 0610 given orange juice PO  Symptoms: tremors, remained alert and oriented at baseline  Follow-up CBG: Time: 0625 CBG Result: 82  Possible Reasons for Event: Insulin regimen may need readjusting.  Comments/MD notified: AM insulin held due to hypoglycemic event.  Morgan Poag NP notified.  Provider will discuss situation will primary care team.      Morgan Morris T  Remember to initiate Hypoglycemia Order Set & complete

## 2013-06-03 NOTE — Progress Notes (Signed)
1530 Telephone report given to Roe Coombs , Nurse , [facility] nursing Facility

## 2013-06-03 NOTE — Progress Notes (Signed)
Patient evaluated for community based chronic disease management services with [facility] Care Management Program as a benefit of patient's [employer].  Spoke with patient at bedside to explain [facility] Management services. Patient is anticipated to discharged to a SNF for short term rehabilitation.  For this reason she is not eligible for services until SNF discharge.  Left contact information and [facility] literature at bedside for patient to discuss with her primary caregiver Lonna Duval.  They will decide at SNF discharge if they feel services will be beneficial.  Made inpatient Case Manager aware that [facility] Care Management has consulted. Of note, [facility] Care Management services does not replace or interfere with any services that are arranged by inpatient case management or social work.  For additional questions or referrals please contact Anibal Henderson BSN RN [facility] Liaison at [phone].

## 2013-06-03 NOTE — Progress Notes (Signed)
1558 to [facility] [employer] transport Team .Endorsed as DNR . Belongings endorsed to transport staff.

## 2013-06-03 NOTE — Discharge Summary (Signed)
Physician Discharge Summary  Morgan Morris AVW:[id number] DOB: 1920-03-31 DOA: 05/28/2013  PCP: Juline Patch, MD  Admit date: 05/28/2013 Discharge date: 06/03/2013  Time spent: Greater than 30 minutes  Recommendations for Outpatient Follow-up:  1. Dr. Juline Patch, PCP in 5 days with repeat labs (BMP & CBC). 2. Dr. Cassell Clement, Cardiology in one week. 3. Followup chest x-ray in a couple of weeks to ensure resolution of pneumonia findings. 4. Monitor for hypoglycemia and adjust insulins appropriately.  Discharge Diagnoses:  Principal Problem:   Atrial fibrillation with RVR Active Problems:   Anemia   CKD (chronic kidney disease), stage III   UTI (lower urinary tract infection)   Acute diastolic CHF (congestive heart failure)   Diabetes mellitus   Acute bronchitis   Discharge Condition: Improved & Stable  Diet recommendation: Heart healthy and diabetic diet  Filed Weights   06/01/13 0453 06/02/13 0436 06/03/13 0500  Weight: 67.7 kg (149 lb 4 oz) 66.5 kg (146 lb 9.7 oz) 65.8 kg (145 lb 1 oz)    History of present illness:  79 y.o. female with known history of atrial fibrillation and CHF found to be short of breath. When EMS arrived patient was given Solu-Medrol and nebulizers and initially was placed on CPAP. On arrival to the ER patient was found to be in A. fib with RVR with features consistent with CHF. Patient was started on Cardizem infusion after bolus with Lasix 40 mg IV. Patient denied any chest pain or fever chills productive cough. Denied abdominal pain dysuria diarrhea.  Hospital Course:  1. Chronic A. fib with acute RVR: Patient was initially placed on Cardizem drip, digoxin and metoprolol was continued. Cardiology consulted. Digoxin was discontinued given advanced age and renal dysfunction. Patient was transitioned to oral Cardizem and metoprolol dose was reduced. Anti-coagulation with Eliquis was continued. Patient heart rate is adequately controlled. Discussed  with cardiology who have cleared her for discharge. 2. Acute on chronic diastolic CHF: EF in March 2014 was 60-65%. This was most likely precipitated by A. fib with RVR. Patient was diuresed with IV Lasix and has clinically done well. She will be discharged on her home dose of Lasix and Spironolactone. Outpatient followup with cardiology. Zocor was changed to Lipitor to avoid interaction with Cardizem. 3. Hypokalemia: Repleted. Continue spironolactone and potassium supplements. Followup BMP in 5 days. 4. UTI: Completed one-week course of antibiotics. Urine culture was suggestive of contamination. 5. Anemia: Possibly from chronic kidney disease. Stable. Mildly low platelets x1-unclear etiology. Follow CBC in 5 days. 6. Stage III chronic kidney disease: Stable. Follow BMP in 5 days. 7. Uncontrolled type 2 diabetes mellitus: Hemoglobin A1c 6.8. Continue home dose of Lantus and sliding scale insulin. Her insulins were increased in hospital while she was on steroids. However since steroids have been discontinued, CBG is a trending down and hence we'll discontinue scheduled mealtime NovoLog to avoid risk of hypoglycemia. This may be reconsidered at the nursing facility. Patient had a hypoglycemic episode, CBG 49 early this morning. We'll reduce Lantus to 12 units each bedtime and continue SSI. Monitor closely at SNF. 8. Left lower lobe atelectasis versus infiltrate versus all secondary to decompensated CHF: Patient completed her last dose of Levaquin today. Recommend repeating chest x-ray in a couple of weeks to ensure resolution of pneumonia findings. 9. Overnight confusion and agitation:? Underlying dementia. Patient was on Seroquel PTA-continue. Seems to have resolved this morning 10. DO NOT RESUSCITATE  Procedures:  None   Consultations:  [place] cardiology  Discharge Exam:  Complaints: Pleasantly confused. Denies dyspnea or chest pain. Does not complain of feeling tired. No agitation or acute  events per nursing. Had a hypoglycemic episode early this morning-resolved after some juices.   Filed Vitals:   06/02/13 2007 06/03/13 0500 06/03/13 0844 06/03/13 1045  BP:  132/52  112/70  Pulse: 62 61  84  Temp:  97.8 F (36.6 C)    TempSrc:  Oral    Resp: 18 18  16   Height:      Weight:  65.8 kg (145 lb 1 oz)    SpO2: 96% 98% 94% 95%     General exam: Comfortable.Lying supine in bed.   Respiratory system:  clear to auscultation. No increased work of breathing.  Cardiovascular system: S1 and S2 heard,  irregularly irregular. No JVD, murmurs. Trace pedal edema. Not on telemetry.   Gastrointestinal system: Abdomen is nondistended, soft and nontender. Normal bowel sounds heard.  Central nervous system: Alert and oriented Person, place and partly to time. No focal neurological deficits.  Extremities: Symmetric 5 x 5 power.  Discharge Instructions      Discharge Orders   Future Orders Complete By Expires     (HEART FAILURE PATIENTS) Call MD:  Anytime you have any of the following symptoms: 1) 3 pound weight gain in 24 hours or 5 pounds in 1 week 2) shortness of breath, with or without a dry hacking cough 3) swelling in the hands, feet or stomach 4) if you have to sleep on extra pillows at night in order to breathe.  As directed     Call MD for:  difficulty breathing, headache or visual disturbances  As directed     Call MD for:  temperature >100.4  As directed     Diet - low sodium heart healthy  As directed     Diet Carb Modified  As directed     Increase activity slowly  As directed         Medication List    STOP taking these medications       lisinopril 2.5 MG tablet  Commonly known as:  PRINIVIL,ZESTRIL     simvastatin 20 MG tablet  Commonly known as:  ZOCOR      TAKE these medications       albuterol 108 (90 BASE) MCG/ACT inhaler  Commonly known as:  PROVENTIL HFA;VENTOLIN HFA  Inhale 2 puffs into the lungs every 6 (six) hours as needed for wheezing.      albuterol (2.5 MG/3ML) 0.083% nebulizer solution  Commonly known as:  PROVENTIL  Take 2.5 mg by nebulization every 6 (six) hours as needed for wheezing.     apixaban 2.5 MG Tabs tablet  Commonly known as:  ELIQUIS  Take 1 tablet (2.5 mg total) by mouth 2 (two) times daily.     atorvastatin 10 MG tablet  Commonly known as:  LIPITOR  Take 1 tablet (10 mg total) by mouth daily at 6 PM.     b complex vitamins tablet  Take 1 tablet by mouth daily at 12 noon.     cholecalciferol 1000 UNITS tablet  Commonly known as:  VITAMIN D  Take 1,000 Units by mouth daily.     diltiazem 180 MG 24 hr capsule  Commonly known as:  CARDIZEM CD  Take 1 capsule (180 mg total) by mouth daily.     escitalopram 10 MG tablet  Commonly known as:  LEXAPRO  Take 10 mg by mouth daily.     furosemide  40 MG tablet  Commonly known as:  LASIX  Take 20-40 mg by mouth 2 (two) times daily. 40 mg in a.m. And 20 mg at noon.     insulin glargine 100 UNIT/ML injection  Commonly known as:  LANTUS  Inject 0.12 mLs (12 Units total) into the skin at bedtime.     insulin regular 100 units/mL injection  Commonly known as:  NOVOLIN R,HUMULIN R  Inject 0.04-0.25 mLs (4-25 Units total) into the skin 3 (three) times daily before meals. Per sliding scale:  0 - 120 = 0 units  121 - 150 =2 units  151 - 200 = 4 units  201 - 250 = 6 units  251 - 300 = 10 units  >300 = 12 units     latanoprost 0.005 % ophthalmic solution  Commonly known as:  XALATAN  Place 1 drop into both eyes at bedtime.     metoprolol tartrate 25 MG tablet  Commonly known as:  LOPRESSOR  Take 0.5 tablets (12.5 mg total) by mouth 2 (two) times daily.     OCUVITE PRESERVISION PO  Take 1 tablet by mouth 2 (two) times daily.     potassium chloride SA 20 MEQ tablet  Commonly known as:  K-DUR,KLOR-CON  Take 2 tablets (40 mEq total) by mouth daily.     QUEtiapine 25 MG tablet  Commonly known as:  SEROQUEL  Take 6.25 mg by mouth at bedtime.      spironolactone 25 MG tablet  Commonly known as:  ALDACTONE  Take 12.5 mg by mouth every morning.     timolol 0.5 % ophthalmic solution  Commonly known as:  BETIMOL  Place 1 drop into both eyes 2 (two) times daily.       Follow-up Information   Follow up with PANG,RICHARD, MD. Schedule an appointment as soon as possible for a visit in 5 days. (To be seen with repeat labs (BMP & CBC))    Contact information:   [address] [place], [address] [place] [address] [phone]       Follow up with Cassell Clement, MD. Schedule an appointment as soon as possible for a visit in 1 week.   Contact information:   [address] [place] [address] [phone]        The results of significant diagnostics from this hospitalization (including imaging, microbiology, ancillary and laboratory) are listed below for reference.    Significant Diagnostic Studies: Dg Chest 2 View  05/30/2013   *RADIOLOGY REPORT*  Clinical Data: Wheezing and CHF.  CHEST - 2 VIEW  Comparison: 05/28/2013  Findings: The heart is enlarged.  There is mild interstitial edema. Left base atelectasis or infiltrate partially obscures the hemidiaphragm and is best seen in the lateral view, posterior in location.  There are small bilateral pleural effusions.  The at the left lung base, there has been slight increase in focal area of atelectasis or developing infiltrate.  Again noted is coarse right breast calcification, overlying the right lung base.  IMPRESSION:  1.  Cardiomegaly and mild edema. 2.  Increased left lower lobe atelectasis or infiltrate.   Original Report Authenticated By: Norva Pavlov, M.D.   Dg Chest 2 View  05/28/2013   *RADIOLOGY REPORT*  Clinical Data: Short of breath.  CHEST - 2 VIEW  Comparison: 03/27/2013.  Findings: Cardiomegaly and interstitial pulmonary edema are present.  Calcification over the right lung base has moved compared to the prior exam, likely within the right breast.  Probable tiny bilateral pleural effusions.  Aortic arch atherosclerosis. Monitoring leads are projected over the chest. Faint Kerley B lines are noted in the periphery of the bases.  IMPRESSION: Mild CHF with cardiomegaly and interstitial pulmonary edema.   Original Report Authenticated By: Andreas Newport, M.D.    Microbiology: Recent Results (from the past 240 hour(s))  URINE CULTURE     Status: None   Collection Time    05/28/13  7:06 PM      Result Value Range Status   Specimen Description URINE, CLEAN CATCH   Final   Special Requests NONE   Final   Culture  Setup Time 05/28/2013 20:12   Final   Colony Count >=100,000 COLONIES/ML   Final   Culture     Final   Value: Multiple bacterial morphotypes present, none predominant. Suggest appropriate recollection if clinically indicated.   Report Status 05/30/2013 FINAL   Final  MRSA PCR SCREENING     Status: None   Collection Time    05/29/13  1:24 AM      Result Value Range Status   MRSA by PCR NEGATIVE  NEGATIVE Final   Comment:            The GeneXpert MRSA Assay (FDA     approved for NASAL specimens     only), is one component of a     comprehensive MRSA colonization     surveillance program. It is not     intended to diagnose MRSA     infection nor to guide or     monitor treatment for     MRSA infections.     Labs: Basic Metabolic Panel:  Recent Labs Lab 05/30/13 0540 05/31/13 0530 06/01/13 0445 06/02/13 0915 06/03/13 0535  NA 134* 137 138 137 140  K 3.8 3.6 3.4* 3.9 3.7  CL 98 97 98 95* 96  CO2 28 30 31  33* 38*  GLUCOSE 226* 264* 128* 140* 43*  BUN 58* 53* 52* 49* 44*  CREATININE 1.57* 1.42* 1.33* 1.38* 1.29*  CALCIUM 8.7 8.7 8.6 8.7 9.1   Liver Function Tests:  Recent Labs Lab 05/29/13 0520  AST 27  ALT 22  ALKPHOS 55  BILITOT 0.7  PROT 6.4  ALBUMIN 3.1*   No results found for this basename: LIPASE, AMYLASE,  in the last 168 hours No results found for this basename: AMMONIA,  in the last 168  hours CBC:  Recent Labs Lab 05/28/13 1752 05/29/13 0520  WBC 9.8 6.1  NEUTROABS  --  5.6  HGB 11.9* 11.1*  HCT 36.3 34.3*  MCV 91.0 90.7  PLT 172 138*   Cardiac Enzymes:  Recent Labs Lab 05/28/13 2225 05/29/13 0520 05/29/13 1045  TROPONINI <0.30 <0.30 <0.30   BNP: BNP (last 3 results)  Recent Labs  03/27/13 1004 03/31/13 0425 05/28/13 1752  PROBNP 3476.0* 2796.0* 16021.0*   CBG:  Recent Labs Lab 06/02/13 2054 06/03/13 0606 06/03/13 0628 06/03/13 0643 06/03/13 1111  GLUCAP 93 49* 82 89 241*    Additional labs:  ABG on admission: PH 7.424, PCO2 39, PO2 133, bicarbonate 26 and oxygen saturation 99%.  Admitting BNP: 16,021  Hemoglobin A1c: 6.8   Signed:  Arlis Yale  [facility] 06/03/2013, 12:35 PM

## 2013-06-03 NOTE — Plan of Care (Signed)
Problem: Phase II Progression Outcomes Goal: Fluid volume status improved Outcome: Progressing Patient transitioned to PO Lasix for fluid maintenance.  Pending discharge to SNF on 06/03/13 if appropriate.  Mental status has been appropriate this shift.  No confusion or combative episodes observed.  Will continue to monitor.

## 2013-06-03 NOTE — Progress Notes (Signed)
Patient could not go to skilled nursing facility on 06/02/2013. Patient did not have any agitation overnight. She did have a hypoglycemic episode with CBG 49 this morning. Lantus reduced to 12 units subcutaneous each bedtime. Continue nursing home SSI at discharge. Monitor CBGs closely at SNF. Rest of history and physical exam as per edited DC summary-medication changes updated.  Discussed with clinical social worker-patient will go to SNF today.  Talon Witting 12:42 PM 06/03/13

## 2013-06-03 NOTE — Progress Notes (Deleted)
Chart review complete.  Patient is not eligible for [facility] Care Management services because his/her PCP is not a [facility] primary care provider or is not [facility] affiliated.  For any additional questions or new referrals please contact Tim Henderson BSN RN [facility] Liaison at [phone] °

## 2013-06-03 NOTE — Progress Notes (Signed)
Physical Therapy Treatment Patient Details Name: Morgan Morris MRN: [id number] DOB: 14-Oct-1920 Today's Date: 06/03/2013 Time: 4696-2952 PT Time Calculation (min): 23 min  PT Assessment / Plan / Recommendation Comments on Treatment Session  Pt is a 79 yo female with CHF. Pt is more anxious today as her son has not come to visit. Pt with good mobility but poor safety awareness, transfering with poor hand placement and walking without the walker. Pt would continue to benefit from SNF to improve safety with mobility and achieve modI for ADLs    Follow Up Recommendations  SNF     Does the patient have the potential to tolerate intense rehabilitation     Barriers to Discharge        Equipment Recommendations       Recommendations for Other Services    Frequency Min 3X/week   Plan Discharge plan remains appropriate    Precautions / Restrictions Precautions Precautions: Fall Restrictions Weight Bearing Restrictions: No   Pertinent Vitals/Pain 0/10    Mobility  Bed Mobility Bed Mobility: Supine to Sit;Sitting - Scoot to Edge of Bed Supine to Sit: 5: Supervision;HOB flat;With rails Sitting - Scoot to Edge of Bed: 5: Supervision;With rail Transfers Transfers: Sit to Stand;Stand to Sit Sit to Stand: 4: Min guard;From bed;From chair/3-in-1 (from bed x1, from 3-in-1 over toilet) Stand to Sit: 4: Min guard;To chair/3-in-1 (to 3in1 x1, to chair x1) Details for Transfer Assistance: repeated VCs for safe hand placement, pt transfered to 3in1 over toilet with good hand placement but with poor hand placement sit to stand from 3in1 Ambulation/Gait Ambulation/Gait Assistance: 4: Min guard Ambulation Distance (Feet): 125 Feet Assistive device: Rolling walker Ambulation/Gait Assistance Details: Pt ambulating with minG for safety, chair follow for first time up, pt repeatedly looking and asking for son Gait Pattern: Step-through pattern;Decreased stride length;Lateral trunk lean to  right;Lateral trunk lean to left Gait velocity: wfl for age General Gait Details: Pt ambulating well with RW, requiring minG for safety and direction.     Exercises     PT Diagnosis:    PT Problem List:   PT Treatment Interventions:     PT Goals Acute Rehab PT Goals PT Goal Formulation: With patient Time For Goal Achievement: 06/15/13 Potential to Achieve Goals: Good PT Goal: Supine/Side to Sit - Progress: Progressing toward goal PT Goal: Sit to Stand - Progress: Progressing toward goal PT Transfer Goal: Bed to Chair/Chair to Bed - Progress: Progressing toward goal PT Goal: Ambulate - Progress: Progressing toward goal  Visit Information  Last PT Received On: 06/03/13 Assistance Needed: +1    Subjective Data  Subjective: Pt recieved in bed, asking to use the bathroom, asking where her son is   Cognition  Cognition Arousal/Alertness: Awake/alert Behavior During Therapy: Anxious Overall Cognitive Status: Impaired/Different from baseline Area of Impairment: Memory;Safety/judgement Memory: Decreased short-term memory Safety/Judgement: Decreased awareness of safety General Comments: Pt anxious because son has not come by today, perseverating, repeatedly asking "where the hell is he?" Pt also forgetting to use the walker, trying to ambulate without it.    Balance  Balance Balance Assessed: Yes Dynamic Standing Balance Dynamic Standing - Balance Support: During functional activity;Bilateral upper extremity supported Dynamic Standing - Level of Assistance: 4: Min assist Dynamic Standing - Balance Activities:  (washing hands, reaching for soap, throwing away paper towels) Dynamic Standing - Comments: Pt with poor safety awareness, trying to ambulate without RW, minG to prevent LOB  End of Session PT - End of Session Equipment  Utilized During Treatment: Gait belt Activity Tolerance: Patient tolerated treatment well Patient left: in chair;with call bell/phone within reach;with  chair alarm set Nurse Communication:  (Pt anxious looking for son)   GP     06/03/2013, 10:52 AM Marvis Moeller, Student Physical Therapist Office #: [phone]

## 2013-06-03 NOTE — Progress Notes (Signed)
Inpatient Diabetes Program Recommendations  AACE/ADA: New Consensus Statement on Inpatient Glycemic Control (2013)  Target Ranges:  Prepandial:   less than 140 mg/dL      Peak postprandial:   less than 180 mg/dL (1-2 hours)      Critically ill patients:  140 - 180 mg/dL     Results for JURLINE, FOLGER (MRN [id number]) as of 06/03/2013 11:00  Ref. Range 06/03/2013 06:06 06/03/2013 06:28 06/03/2013 06:43  Glucose-Capillary Latest Range: 70-99 mg/dL 49 (L) 82 89    Hypoglycemic this morning.  Corrected with oral fluids (juice) per Hyperglycemia protocol.  Noted Novolog Moderate correction scale (SSI) and Novolog 6 units tid with meals (meal coverage) d/c'd as a result.  Recommend the following insulin adjustments: 1. Decrease Lantus to 12 units QHS 2. Add back Novolog Sensitive correction scale (SSI) tid   **Noted patient to be d/c'd back to SNF when bed available- Above recommendations are for patient if she stays at the hospital another day or two  Will follow. Ambrose Finland RN, MSN, CDE Diabetes Coordinator Inpatient Diabetes Program [phone]

## 2013-06-04 NOTE — Progress Notes (Signed)
Rosalena Mccorry, PTA [phone] 06/04/2013  

## 2013-06-04 NOTE — Clinical Social Work Placement (Addendum)
    Clinical Social Work Department CLINICAL SOCIAL WORK PLACEMENT NOTE 06/04/2013  Patient:  Morgan Morris, Morgan Morris  Account Number:  [id number] Admit date:  05/28/2013  Clinical Social Worker:  Theresia Bough, Theresia Majors  Date/time:  05/31/2013 03:22 PM  Clinical Social Work is seeking post-discharge placement for this patient at the following level of care:   SKILLED NURSING   (*CSW will update this form in Epic as items are completed)   05/31/2013  Patient/family provided with [facility] Department of Clinical Social Work's list of facilities offering this level of care within the geographic area requested by the patient (or if unable, by the patient's family).  05/31/2013  Patient/family informed of their freedom to choose among providers that offer the needed level of care, that participate in Medicare, Medicaid or managed care program needed by the patient, have an available bed and are willing to accept the patient.  05/31/2013  Patient/family informed of [facility]' ownership interest in [facility], as well as of the fact that they are under no obligation to receive care at this facility.  PASARR submitted to EDS on 05/31/2013 PASARR number received from EDS on   FL2 transmitted to all facilities in geographic area requested by pt/family on  05/31/2013 FL2 transmitted to all facilities within larger geographic area on   Patient informed that his/her managed care company has contracts with or will negotiate with  certain facilities, including the following:   NA     Patient/family informed of bed offers received:  06/03/2013 Patient chooses bed at [facility] Physician recommends and patient chooses bed at    Patient to be transferred to [facility] on  06/03/2013 Patient to be transferred to facility by Ambulance  [facility])  The following physician request were entered in Epic:   Additional Comments: 06/03/13  OK per MD  for d/c today to SNF- patient and family are in agreement with d/c plan.  [facility] is aware of change in level of care.  Notified SNF and pt's nurse of d/c. No further CSW needs identified. CSW signing off.  Lorri Frederick. Kevia Zaucha, LCSWA  [phone]

## 2013-08-11 ENCOUNTER — Encounter (HOSPITAL_COMMUNITY): Payer: Self-pay | Admitting: *Deleted

## 2013-08-11 ENCOUNTER — Inpatient Hospital Stay (HOSPITAL_COMMUNITY)
Admission: EM | Admit: 2013-08-11 | Discharge: 2013-08-17 | DRG: 291 | Disposition: A | Discharge status: Home Health | Payer: Medicare Other | Attending: Internal Medicine | Admitting: Internal Medicine

## 2013-08-11 ENCOUNTER — Emergency Department (HOSPITAL_COMMUNITY): Payer: Medicare Other

## 2013-08-11 DIAGNOSIS — E1169 Type 2 diabetes mellitus with other specified complication: Secondary | ICD-10-CM | POA: Diagnosis present

## 2013-08-11 DIAGNOSIS — I10 Essential (primary) hypertension: Secondary | ICD-10-CM | POA: Diagnosis present

## 2013-08-11 DIAGNOSIS — K208 Other esophagitis without bleeding: Secondary | ICD-10-CM | POA: Diagnosis present

## 2013-08-11 DIAGNOSIS — Z22322 Carrier or suspected carrier of Methicillin resistant Staphylococcus aureus: Secondary | ICD-10-CM

## 2013-08-11 DIAGNOSIS — E162 Hypoglycemia, unspecified: Secondary | ICD-10-CM

## 2013-08-11 DIAGNOSIS — Z8659 Personal history of other mental and behavioral disorders: Secondary | ICD-10-CM

## 2013-08-11 DIAGNOSIS — Z66 Do not resuscitate: Secondary | ICD-10-CM | POA: Diagnosis not present

## 2013-08-11 DIAGNOSIS — R7881 Bacteremia: Secondary | ICD-10-CM | POA: Diagnosis present

## 2013-08-11 DIAGNOSIS — F0390 Unspecified dementia without behavioral disturbance: Secondary | ICD-10-CM | POA: Diagnosis present

## 2013-08-11 DIAGNOSIS — I5033 Acute on chronic diastolic (congestive) heart failure: Principal | ICD-10-CM | POA: Diagnosis present

## 2013-08-11 DIAGNOSIS — D649 Anemia, unspecified: Secondary | ICD-10-CM

## 2013-08-11 DIAGNOSIS — F329 Major depressive disorder, single episode, unspecified: Secondary | ICD-10-CM | POA: Diagnosis present

## 2013-08-11 DIAGNOSIS — R0602 Shortness of breath: Secondary | ICD-10-CM | POA: Diagnosis present

## 2013-08-11 DIAGNOSIS — M129 Arthropathy, unspecified: Secondary | ICD-10-CM | POA: Diagnosis present

## 2013-08-11 DIAGNOSIS — E119 Type 2 diabetes mellitus without complications: Secondary | ICD-10-CM

## 2013-08-11 DIAGNOSIS — I509 Heart failure, unspecified: Secondary | ICD-10-CM | POA: Diagnosis present

## 2013-08-11 DIAGNOSIS — I5031 Acute diastolic (congestive) heart failure: Secondary | ICD-10-CM | POA: Diagnosis present

## 2013-08-11 DIAGNOSIS — J189 Pneumonia, unspecified organism: Secondary | ICD-10-CM | POA: Diagnosis present

## 2013-08-11 DIAGNOSIS — Z794 Long term (current) use of insulin: Secondary | ICD-10-CM

## 2013-08-11 DIAGNOSIS — I4891 Unspecified atrial fibrillation: Secondary | ICD-10-CM

## 2013-08-11 DIAGNOSIS — F3289 Other specified depressive episodes: Secondary | ICD-10-CM | POA: Diagnosis present

## 2013-08-11 DIAGNOSIS — K2210 Ulcer of esophagus without bleeding: Secondary | ICD-10-CM | POA: Diagnosis present

## 2013-08-11 DIAGNOSIS — Z8701 Personal history of pneumonia (recurrent): Secondary | ICD-10-CM

## 2013-08-11 DIAGNOSIS — Z7901 Long term (current) use of anticoagulants: Secondary | ICD-10-CM

## 2013-08-11 HISTORY — DX: Heart failure, unspecified: I50.9

## 2013-08-11 HISTORY — DX: Unspecified atrial fibrillation: I48.91

## 2013-08-11 LAB — POCT I-STAT 3, ART BLOOD GAS (G3+)
Acid-Base Excess: 2 mmol/L (ref 0.0–2.0)
Bicarbonate: 26 mEq/L — ABNORMAL HIGH (ref 20.0–24.0)
O2 Saturation: 93 %
Patient temperature: 98.6
TCO2: 27 mmol/L (ref 0–100)
pCO2 arterial: 38.5 mmHg (ref 35.0–45.0)
pH, Arterial: 7.438 (ref 7.350–7.450)
pO2, Arterial: 64 mmHg — ABNORMAL LOW (ref 80.0–100.0)

## 2013-08-11 LAB — CBC WITH DIFFERENTIAL/PLATELET
Basophils Absolute: 0.1 10*3/uL (ref 0.0–0.1)
Basophils Relative: 1 % (ref 0–1)
Eosinophils Relative: 2 % (ref 0–5)
HCT: 30.3 % — ABNORMAL LOW (ref 36.0–46.0)
MCHC: 35.6 g/dL (ref 30.0–36.0)
MCV: 92.7 fL (ref 78.0–100.0)
Monocytes Absolute: 1.2 10*3/uL — ABNORMAL HIGH (ref 0.1–1.0)
RDW: 15.4 % (ref 11.5–15.5)

## 2013-08-11 LAB — TROPONIN I: Troponin I: <0.3 ng/mL (ref <0.30)

## 2013-08-11 LAB — URINALYSIS, ROUTINE W REFLEX MICROSCOPIC
Bilirubin Urine: NEGATIVE
Glucose, UA: NEGATIVE mg/dL
Hgb urine dipstick: NEGATIVE
Ketones, ur: NEGATIVE mg/dL
Leukocytes, UA: NEGATIVE
Nitrite: NEGATIVE
Protein, ur: NEGATIVE mg/dL
Specific Gravity, Urine: 1.014 (ref 1.005–1.030)
Urobilinogen, UA: 0.2 mg/dL (ref 0.0–1.0)
pH: 5 (ref 5.0–8.0)

## 2013-08-11 LAB — COMPREHENSIVE METABOLIC PANEL
ALT: 34 U/L (ref 0–35)
AST: 41 U/L — ABNORMAL HIGH (ref 0–37)
Albumin: 3.5 g/dL (ref 3.5–5.2)
Alkaline Phosphatase: 67 U/L (ref 39–117)
BUN: 50 mg/dL — ABNORMAL HIGH (ref 6–23)
CO2: 26 mEq/L (ref 19–32)
Calcium: 9.6 mg/dL (ref 8.4–10.5)
Chloride: 99 mEq/L (ref 96–112)
Creatinine, Ser: 1.47 mg/dL — ABNORMAL HIGH (ref 0.50–1.10)
GFR calc Af Amer: 35 mL/min — ABNORMAL LOW (ref >90)
GFR calc non Af Amer: 30 mL/min — ABNORMAL LOW (ref >90)
Glucose, Bld: 75 mg/dL (ref 70–99)
Potassium: 4.3 mEq/L (ref 3.5–5.1)
Sodium: 138 mEq/L (ref 135–145)
Total Bilirubin: 0.7 mg/dL (ref 0.3–1.2)
Total Protein: 7.2 g/dL (ref 6.0–8.3)

## 2013-08-11 MED ORDER — QUETIAPINE FUMARATE 25 MG PO TABS: 6.2500 mg | ORAL_TABLET | Freq: Every day | ORAL | Status: DC

## 2013-08-11 MED ORDER — ONDANSETRON HCL 4 MG/2ML IJ SOLN: 4.0000 mg | Freq: Four times a day (QID) | INTRAMUSCULAR | Status: DC | PRN

## 2013-08-11 MED ORDER — ALBUTEROL SULFATE (5 MG/ML) 0.5% IN NEBU: 2.5000 mg | INHALATION_SOLUTION | RESPIRATORY_TRACT | Status: DC | PRN

## 2013-08-11 MED ORDER — ALBUTEROL SULFATE HFA 108 (90 BASE) MCG/ACT IN AERS: 2.0000 | INHALATION_SPRAY | Freq: Four times a day (QID) | RESPIRATORY_TRACT | Status: DC | PRN

## 2013-08-11 MED ORDER — INSULIN GLARGINE 100 UNIT/ML ~~LOC~~ SOLN
20.0000 [IU] | Freq: Every day | SUBCUTANEOUS | Status: DC
  Administered 2013-08-12 – 2013-08-14 (×3): 20 [IU] via SUBCUTANEOUS
  Filled 2013-08-11 (×3): qty 0.2

## 2013-08-11 MED ORDER — METOPROLOL TARTRATE 12.5 MG HALF TABLET
12.5000 mg | ORAL_TABLET | Freq: Two times a day (BID) | ORAL | Status: DC
  Administered 2013-08-11 – 2013-08-12 (×2): 12.5 mg via ORAL
  Filled 2013-08-11 (×3): qty 1

## 2013-08-11 MED ORDER — FUROSEMIDE 10 MG/ML IJ SOLN
40.0000 mg | Freq: Every day | INTRAMUSCULAR | Status: DC
  Administered 2013-08-12 – 2013-08-17 (×6): 40 mg via INTRAVENOUS
  Filled 2013-08-11 (×6): qty 4

## 2013-08-11 MED ORDER — SODIUM CHLORIDE 0.9 % IJ SOLN
3.0000 mL | Freq: Two times a day (BID) | INTRAMUSCULAR | Status: DC
  Administered 2013-08-11 – 2013-08-17 (×12): 3 mL via INTRAVENOUS

## 2013-08-11 MED ORDER — OCUVITE PRESERVISION PO TABS: 1.0000 | ORAL_TABLET | Freq: Two times a day (BID) | ORAL | Status: DC

## 2013-08-11 MED ORDER — ESCITALOPRAM OXALATE 20 MG PO TABS
20.0000 mg | ORAL_TABLET | Freq: Every day | ORAL | Status: DC
  Administered 2013-08-12 – 2013-08-17 (×6): 20 mg via ORAL
  Filled 2013-08-11 (×6): qty 1

## 2013-08-11 MED ORDER — FUROSEMIDE 10 MG/ML IJ SOLN
40.0000 mg | Freq: Once | INTRAMUSCULAR | Status: AC
  Administered 2013-08-11: 40 mg via INTRAVENOUS
  Filled 2013-08-11: qty 4

## 2013-08-11 MED ORDER — INSULIN GLARGINE 100 UNIT/ML ~~LOC~~ SOLN: 10.0000 [IU] | Freq: Every day | SUBCUTANEOUS | Status: DC

## 2013-08-11 MED ORDER — DILTIAZEM HCL ER COATED BEADS 180 MG PO CP24
180.0000 mg | ORAL_CAPSULE | Freq: Every day | ORAL | Status: DC
  Administered 2013-08-12 – 2013-08-17 (×6): 180 mg via ORAL
  Filled 2013-08-11 (×6): qty 1

## 2013-08-11 MED ORDER — OCUVITE-LUTEIN PO CAPS
1.0000 | ORAL_CAPSULE | Freq: Two times a day (BID) | ORAL | Status: DC
  Administered 2013-08-11 – 2013-08-16 (×11): 1 via ORAL
  Filled 2013-08-11 (×9): qty 1

## 2013-08-11 MED ORDER — POTASSIUM CHLORIDE CRYS ER 20 MEQ PO TBCR
40.0000 meq | EXTENDED_RELEASE_TABLET | Freq: Every day | ORAL | Status: DC
  Administered 2013-08-12 – 2013-08-17 (×6): 40 meq via ORAL
  Filled 2013-08-11 (×6): qty 2

## 2013-08-11 MED ORDER — DOCUSATE SODIUM 100 MG PO CAPS
100.0000 mg | ORAL_CAPSULE | Freq: Two times a day (BID) | ORAL | Status: DC
  Administered 2013-08-12 – 2013-08-17 (×10): 100 mg via ORAL
  Filled 2013-08-11 (×13): qty 1

## 2013-08-11 MED ORDER — B COMPLEX PO TABS: 1.0000 | ORAL_TABLET | Freq: Every day | ORAL | Status: DC

## 2013-08-11 MED ORDER — LEVOFLOXACIN IN D5W 750 MG/150ML IV SOLN: 750.0000 mg | INTRAVENOUS | Status: DC

## 2013-08-11 MED ORDER — LEVOFLOXACIN IN D5W 750 MG/150ML IV SOLN
750.0000 mg | Freq: Once | INTRAVENOUS | Status: AC
  Administered 2013-08-12: 750 mg via INTRAVENOUS
  Filled 2013-08-11 (×2): qty 150

## 2013-08-11 MED ORDER — SPIRONOLACTONE 12.5 MG HALF TABLET
12.5000 mg | ORAL_TABLET | Freq: Every morning | ORAL | Status: DC
  Administered 2013-08-12 – 2013-08-17 (×6): 12.5 mg via ORAL
  Filled 2013-08-11 (×6): qty 1

## 2013-08-11 MED ORDER — SIMVASTATIN 20 MG PO TABS
20.0000 mg | ORAL_TABLET | Freq: Every day | ORAL | Status: DC
  Filled 2013-08-11: qty 1

## 2013-08-11 MED ORDER — VITAMIN D3 25 MCG (1000 UNIT) PO TABS
1000.0000 [IU] | ORAL_TABLET | Freq: Every day | ORAL | Status: DC
  Administered 2013-08-12 – 2013-08-17 (×6): 1000 [IU] via ORAL
  Filled 2013-08-11 (×6): qty 1

## 2013-08-11 MED ORDER — VANCOMYCIN HCL IN DEXTROSE 750-5 MG/150ML-% IV SOLN
750.0000 mg | INTRAVENOUS | Status: DC
  Administered 2013-08-12 – 2013-08-16 (×5): 750 mg via INTRAVENOUS
  Filled 2013-08-11 (×6): qty 150

## 2013-08-11 MED ORDER — LATANOPROST 0.005 % OP SOLN
1.0000 [drp] | Freq: Every day | OPHTHALMIC | Status: DC
  Administered 2013-08-11 – 2013-08-16 (×6): 1 [drp] via OPHTHALMIC
  Filled 2013-08-11: qty 2.5

## 2013-08-11 MED ORDER — VANCOMYCIN HCL 500 MG IV SOLR
500.0000 mg | Freq: Once | INTRAVENOUS | Status: DC
  Filled 2013-08-11: qty 500

## 2013-08-11 MED ORDER — TIMOLOL MALEATE 0.5 % OP SOLN
1.0000 [drp] | Freq: Every day | OPHTHALMIC | Status: DC
  Administered 2013-08-12 – 2013-08-17 (×6): 1 [drp] via OPHTHALMIC
  Filled 2013-08-11 (×2): qty 5

## 2013-08-11 MED ORDER — ONDANSETRON HCL 4 MG PO TABS: 4.0000 mg | ORAL_TABLET | Freq: Four times a day (QID) | ORAL | Status: DC | PRN

## 2013-08-11 MED ORDER — INSULIN GLARGINE 100 UNIT/ML ~~LOC~~ SOLN
10.0000 [IU] | Freq: Every day | SUBCUTANEOUS | Status: DC
  Administered 2013-08-12 – 2013-08-16 (×5): 10 [IU] via SUBCUTANEOUS
  Filled 2013-08-11 (×5): qty 0.1

## 2013-08-11 MED ORDER — B COMPLEX-C PO TABS
1.0000 | ORAL_TABLET | Freq: Every day | ORAL | Status: DC
  Administered 2013-08-12 – 2013-08-17 (×6): 1 via ORAL
  Filled 2013-08-11 (×6): qty 1

## 2013-08-11 MED ORDER — LEVOFLOXACIN IN D5W 500 MG/100ML IV SOLN
500.0000 mg | INTRAVENOUS | Status: DC
  Administered 2013-08-13: 500 mg via INTRAVENOUS
  Filled 2013-08-11 (×2): qty 100

## 2013-08-11 MED ORDER — APIXABAN 2.5 MG PO TABS
2.5000 mg | ORAL_TABLET | Freq: Two times a day (BID) | ORAL | Status: DC
  Administered 2013-08-11 – 2013-08-17 (×12): 2.5 mg via ORAL
  Filled 2013-08-11 (×13): qty 1

## 2013-08-11 NOTE — Progress Notes (Signed)
ANTIBIOTIC CONSULT NOTE - INITIAL  Pharmacy Consult for Levaquin, Vancomycin Indication: rule out pneumonia  Allergies  Allergen Reactions  . Penicillins   . Sulfa Antibiotics     Patient Measurements: Height: 5\' 2"  (157.5 cm) Weight: 140 lb (63.504 kg) IBW/kg (Calculated) : 50.1  Labs:  Recent Labs  08/11/13 1838  WBC 12.5*  HGB 10.8*  PLT 231  CREATININE 1.47*   Estimated Creatinine Clearance: 22.3 ml/min (by C-G formula based on Cr of 1.47). No results found for this basename: VANCOTROUGH, VANCOPEAK, VANCORANDOM, GENTTROUGH, GENTPEAK, GENTRANDOM, TOBRATROUGH, TOBRAPEAK, TOBRARND, AMIKACINPEAK, AMIKACINTROU, AMIKACIN,  in the last 72 hours   Microbiology: No results found for this or any previous visit (from the past 720 hour(s)).  Medical History: Past Medical History  Diagnosis Date  . Hypertension   . Diabetes mellitus   . Depression   . Erosive esophagitis 04/02/2012  . Mallory - Weiss tear 04/02/2012  . Dysrhythmia 02/24/2013    ATRIAL FIB WITH RVR  . Arthritis   . Atrial fibrillation     Assessment: 79 year old female from [facility] admitted with SOB, confusion, and AMS.  Beginning Levaquin and vancomycin for presumed pneumonia.  With renal insufficiency.  Goal of Therapy:  Vancomycin trough level 15-20 mcg/ml Appropriate Levaquin dosing  Plan:  1) Levaquin 500 mg iv Q 48 hours starting 8/22 2) Vancomycin 750 mg iv Q 24 hours starting 8/21 3) Follow up progress, renal function, cultures.  Thank you. Okey Regal, PharmD [phone]  08/11/2013,9:10 PM

## 2013-08-11 NOTE — ED Notes (Signed)
Pt states she feels weak. Pt states she is very hungry.

## 2013-08-11 NOTE — ED Notes (Signed)
Secretary paged [facility] to transport pt back to [facility].

## 2013-08-11 NOTE — H&P (Signed)
[facility] History and Physical  KYAIRA TRANTHAM AVW:[id number] DOB: 1920/03/03    PCP:   Juline Patch, MD   Chief Complaint: shortness of breath and increase confusion.  HPI: Morgan Morris is an 79 y.o. female nursing home resident with DNR code status, hx of dementia, recent admission for PNA along with afib RVR on anticoagulation with Eliquis, DM, HTN, brought to the ER as she was having increased confusion and shortness of breath.  Evaluation in the ER included a CXR which showed multiple infiltrate unclear if they were PNA or atelectasis.  She has a WBC of 12K, with Hb of 10.8 g/DL, and Cr of 1.4.  As she was being discharged, she was having more DOE and a little diaphoresis.  She had no ischemic EKG changes and negative initial troponin.  At this point, she was given Levoquin and VAN, and hospitalist was asked to admit her for HCAP.  Rewiew of Systems:  I am not able to obtain reliable ROS.  Past Medical History  Diagnosis Date  . Hypertension   . Diabetes mellitus   . Depression   . Erosive esophagitis 04/02/2012  . Mallory - Weiss tear 04/02/2012  . Dysrhythmia 02/24/2013    ATRIAL FIB WITH RVR  . Arthritis   . Atrial fibrillation     Past Surgical History  Procedure Laterality Date  . Abdominal hysterectomy    . Appendectomy    . Cholecystectomy    . Esophagogastroduodenoscopy  03/31/2012    Procedure: ESOPHAGOGASTRODUODENOSCOPY (EGD);  Surgeon: Meryl Dare, MD,FACG;  Location: [facility];  Service: Endoscopy;  Laterality: N/A;    Medications:  HOME MEDS: Prior to Admission medications   Medication Sig Start Date End Date Taking? Authorizing Provider  albuterol (PROVENTIL HFA;VENTOLIN HFA) 108 (90 BASE) MCG/ACT inhaler Inhale 2 puffs into the lungs every 6 (six) hours as needed for wheezing. 02/27/13  Yes Richarda Overlie, MD  albuterol (PROVENTIL) (2.5 MG/3ML) 0.083% nebulizer solution Take 2.5 mg by nebulization every 6 (six) hours as needed for wheezing.   Yes  Historical Provider, MD  apixaban (ELIQUIS) 2.5 MG TABS tablet Take 1 tablet (2.5 mg total) by mouth 2 (two) times daily. 02/27/13  Yes Richarda Overlie, MD  b complex vitamins tablet Take 1 tablet by mouth daily at 12 noon.   Yes Historical Provider, MD  cholecalciferol (VITAMIN D) 1000 UNITS tablet Take 1,000 Units by mouth daily.   Yes Historical Provider, MD  diltiazem (CARDIZEM CD) 180 MG 24 hr capsule Take 1 capsule (180 mg total) by mouth daily. 06/02/13  Yes Elease Etienne, MD  escitalopram (LEXAPRO) 10 MG tablet Take 20 mg by mouth daily.    Yes Historical Provider, MD  furosemide (LASIX) 20 MG tablet Take 40 mg by mouth daily.   Yes Historical Provider, MD  insulin glargine (LANTUS) 100 UNIT/ML injection Inject 10-20 Units into the skin 2 (two) times daily. 20 units in the am and 10 units in the PM   Yes Historical Provider, MD  insulin regular (NOVOLIN R,HUMULIN R) 100 units/mL injection Inject 0.04-0.25 mLs (4-25 Units total) into the skin 3 (three) times daily before meals. Per sliding scale: 0 - 120 = 0 units 121 - 150 =2 units 151 - 200 = 4 units 201 - 250 = 6 units 251 - 300 = 10 units >300 = 12 units 04/01/13  Yes Zannie Cove, MD  latanoprost (XALATAN) 0.005 % ophthalmic solution Place 1 drop into both eyes at bedtime.   Yes  Historical Provider, MD  metoprolol tartrate (LOPRESSOR) 25 MG tablet Take 0.5 tablets (12.5 mg total) by mouth 2 (two) times daily. 06/02/13  Yes Elease Etienne, MD  Multiple Vitamins-Minerals (OCUVITE PRESERVISION PO) Take 1 tablet by mouth 2 (two) times daily.   Yes Historical Provider, MD  potassium chloride SA (K-DUR,KLOR-CON) 20 MEQ tablet Take 2 tablets (40 mEq total) by mouth daily. 04/01/13  Yes Zannie Cove, MD  pravastatin (PRAVACHOL) 40 MG tablet Take 40 mg by mouth daily.   Yes Historical Provider, MD  QUEtiapine (SEROQUEL) 25 MG tablet Take 6.25 mg by mouth at bedtime.   Yes Historical Provider, MD  spironolactone (ALDACTONE) 25 MG tablet Take  12.5 mg by mouth every morning.   Yes Historical Provider, MD  timolol (TIMOPTIC) 0.5 % ophthalmic solution Place 1 drop into both eyes daily.   Yes Historical Provider, MD     Allergies:  Allergies  Allergen Reactions  . Penicillins   . Sulfa Antibiotics     Social History:   reports that she has never smoked. She has never used smokeless tobacco. She reports that she does not drink alcohol or use illicit drugs.  Family History: Family History  Problem Relation Age of Onset  . Cancer Mother      Physical Exam: Filed Vitals:   08/11/13 2130 08/11/13 2200 08/11/13 2231 08/11/13 2316  BP: 110/89 130/85 130/73   Pulse: 118 75 48 90  Temp:   97 F (36.1 C)   TempSrc:   Oral   Resp: 15 30 18    Height:   5\' 2"  (1.575 m)   Weight:   62.4 kg (137 lb 9.1 oz)   SpO2: 100% 100% 100%    Blood pressure 130/73, pulse 90, temperature 97 F (36.1 C), temperature source Oral, resp. rate 18, height 5\' 2"  (1.575 m), weight 62.4 kg (137 lb 9.1 oz), SpO2 100.00%.  GEN:  Pleasant  patient lying in the stretcher in no acute distress; cooperative with exam. PSYCH:  alert and oriented x4; does not appear anxious or depressed; affect is appropriate. HEENT: Mucous membranes pink and anicteric; PERRLA; EOM intact; no cervical lymphadenopathy nor thyromegaly or carotid bruit; some 7cm JVD; There were no stridor. Neck is very supple. Breasts:: Not examined CHEST WALL: No tenderness CHEST: Normal respiration, no rales or wheezing. HEART: Regular rate and rhythm.  There are no murmur, rub, or gallops.   BACK: No kyphosis or scoliosis; no CVA tenderness ABDOMEN: soft and non-tender; no masses, no organomegaly, normal abdominal bowel sounds; no pannus; no intertriginous candida. There is no rebound and no distention. Rectal Exam: Not done EXTREMITIES: No bone or joint deformity; age-appropriate arthropathy of the hands and knees; no edema; no ulcerations.  There is no calf tenderness. Genitalia: not  examined PULSES: 2+ and symmetric SKIN: Normal hydration no rash or ulceration CNS: Cranial nerves 2-12 grossly intact no focal lateralizing neurologic deficit.  Speech is fluent; uvula elevated with phonation, facial symmetry and tongue midline. DTR are normal bilaterally, cerebella exam is intact, barbinski is negative and strengths are equaled bilaterally.  No sensory loss.   Labs on Admission:  Basic Metabolic Panel:  Recent Labs Lab 08/11/13 1838  NA 138  K 4.3  CL 99  CO2 26  GLUCOSE 75  BUN 50*  CREATININE 1.47*  CALCIUM 9.6   Liver Function Tests:  Recent Labs Lab 08/11/13 1838  AST 41*  ALT 34  ALKPHOS 67  BILITOT 0.7  PROT 7.2  ALBUMIN 3.5  No results found for this basename: LIPASE, AMYLASE,  in the last 168 hours No results found for this basename: AMMONIA,  in the last 168 hours CBC:  Recent Labs Lab 08/11/13 1838  WBC 12.5*  NEUTROABS 9.8*  HGB 10.8*  HCT 30.3*  MCV 92.7  PLT 231   Cardiac Enzymes:  Recent Labs Lab 08/11/13 1838  TROPONINI <0.30    CBG: No results found for this basename: GLUCAP,  in the last 168 hours   Radiological Exams on Admission: Dg Chest 2 View  08/11/2013   CLINICAL DATA:  Altered mental status. Diabetes.  EXAM: CHEST  2 VIEW  COMPARISON:  05/30/2013  FINDINGS: Mild cardiomegaly noted with primarily linear airspace opacities in the lingula and left lower lobe, and indistinct new airspace opacity in the right middle lobe. Faint interstitial accentuation. Atherosclerotic aortic arch. Dextro convex lumbar scoliosis. Splenic artery calcification.  IMPRESSION: 1. Increased airspace opacity in the right middle lobe with similar bandlike opacities in the lingula and left lower lobe. Although some of these have morphology that favors atelectasis, a component of pneumonia is not excluded. Followup to clearance is recommended. 2. Cardiomegaly. 3. Atherosclerosis.   Electronically Signed   By: Herbie Baltimore   On: 08/11/2013  19:06   Assessment/Plan Present on Admission:  . Acute diastolic CHF (congestive heart failure) . Atrial fibrillation with RVR . Diabetes mellitus . Shortness of breath . HCAP (healthcare-associated pneumonia)  PLAN:  I think she is having acute distolic CHF, with or without concomitant HCAP.  Will need diuresis with IV lasix, and I will continue with her VAN/ Levoquin.  For her DM, will continue with medication, place on carb modified diet and continue with SSI.  For her afib, rate is controlled, and will continue anticoagulation with Eliquis.  She is stable and will be admitted to telemetry under [facility] service.  She is a DNR.  Other plans as per orders.  Code Status: DNR.   Houston Siren, MD. [facility] Pager [phone] 7pm to 7am.  08/12/2013, 1:19 AM

## 2013-08-11 NOTE — ED Notes (Addendum)
Pt's 02 sats on 6L 02 nasal cannula reading 91%. Pt placed on NRB at 10L/min. 02 sats now reading 98%. MD informed. MD at bedside.

## 2013-08-11 NOTE — ED Provider Notes (Signed)
80 year old female, intermittent confusion over the last several days, patient was alert and oriented at the time that paramedics found the patient with a normal blood sugar. The patient is able to talk to me but has some memory deficit when comes to the month. She has no other complaints including no fevers chills nausea vomiting diarrhea swelling chest pain or palpitations. On my exam she has a soft abdomen, clear heart and lung sounds though she is in atrial fibrillation with a controlled rate. She has no peripheral edema, follows commands without difficulty, appears fairly benign. We'll evaluate for possible urinary infection or metabolic abnormality, patient is hemodynamically stable on initial  evaluation.  The chest x-ray shows possible pneumonia, the patient had an acute decline in her status with hypoxia and diaphoresis requiring admission to the hospital.  I saw and evaluated the patient, reviewed the resident's note and I agree with the findings and plan.     I personally interpreted the EKG as well as the resident and agree with the interpretation on the resident's chart.       Vida Roller, MD 08/16/13 [phone]

## 2013-08-11 NOTE — ED Notes (Signed)
RN received phone call stating, pt's label were not on the blood cultures drawn. MD informed. Antibiotic stopped per MD order. New orders for blood cultures to be redrawn placed.

## 2013-08-11 NOTE — ED Notes (Signed)
Pt from [facility]. Staff went to take 1600 vitals and found pt to be confused. [facility] called to take pt to ER for further evaluation. Pt alert to self upon arrival. Pt alert and oriented x 3 at this time. CBG 116. 20g PIV in left antecubital. VSS.

## 2013-08-11 NOTE — ED Notes (Signed)
Phlebotomy at bedside.

## 2013-08-11 NOTE — ED Notes (Signed)
5L/min 02 changed to 4L/min 02. Pt's 02 sats 100%.

## 2013-08-11 NOTE — ED Notes (Addendum)
Pt diaphoretic upon assessment, MD informed. MD at bedside. Pt tachypenic. [facility] called to cancel transport. Pt 02 sats reading 91%.  Pt placed on 4L 02. Pt's 02 sats reading 94%.

## 2013-08-11 NOTE — ED Notes (Signed)
Phlebotomy informed we need blood cultures drawn.

## 2013-08-11 NOTE — ED Provider Notes (Signed)
CSN: [id number]     Arrival date & time 08/11/13  1806 History     First MD Initiated Contact with Patient 08/11/13 1809     Chief Complaint  Patient presents with  . Altered Mental Status   (Consider location/radiation/quality/duration/timing/severity/associated sxs/prior Treatment) Patient is a 79 y.o. female presenting with altered mental status. The history is provided by the patient and the EMS personnel.  Altered Mental Status Presenting symptoms: behavior changes and confusion   Severity:  Moderate Most recent episode:  Today Episode history:  Single Duration:  1 hour Timing:  Constant Progression:  Improving Chronicity:  New Context: dementia and nursing home resident   Associated symptoms: agitation   Associated symptoms: no abdominal pain, no difficulty breathing, no fever, no headaches, no light-headedness, no nausea, no palpitations, no rash, no visual change, no vomiting and no weakness     Past Medical History  Diagnosis Date  . Hypertension   . Diabetes mellitus   . Depression   . Erosive esophagitis 04/02/2012  . Mallory - Weiss tear 04/02/2012  . Dysrhythmia 02/24/2013    ATRIAL FIB WITH RVR  . Arthritis   . Atrial fibrillation    Past Surgical History  Procedure Laterality Date  . Abdominal hysterectomy    . Appendectomy    . Cholecystectomy    . Esophagogastroduodenoscopy  03/31/2012    Procedure: ESOPHAGOGASTRODUODENOSCOPY (EGD);  Surgeon: Meryl Dare, MD,FACG;  Location: [facility];  Service: Endoscopy;  Laterality: N/A;   Family History  Problem Relation Age of Onset  . Cancer Mother    History  Substance Use Topics  . Smoking status: Never Smoker   . Smokeless tobacco: Never Used  . Alcohol Use: No   OB History   Grav Para Term Preterm Abortions TAB SAB Ect Mult Living                 Review of Systems  Constitutional: Negative for fever, chills, diaphoresis, appetite change and fatigue.  HENT: Negative for congestion and  rhinorrhea.   Respiratory: Positive for shortness of breath. Negative for cough, chest tightness and wheezing.   Cardiovascular: Negative for chest pain, palpitations and leg swelling.  Gastrointestinal: Negative for nausea, vomiting, abdominal pain, diarrhea and abdominal distention.  Genitourinary: Negative for dysuria, hematuria and difficulty urinating.  Musculoskeletal: Negative for back pain and gait problem.  Skin: Negative for rash.  Neurological: Negative for dizziness, syncope, facial asymmetry, speech difficulty, weakness, light-headedness, numbness and headaches.  Psychiatric/Behavioral: Positive for confusion and agitation.  All other systems reviewed and are negative.    Allergies  Penicillins and Sulfa antibiotics  Home Medications   No current outpatient prescriptions on file. BP 130/73  Pulse 90  Temp(Src) 97 F (36.1 C) (Oral)  Resp 18  Ht 5\' 2"  (1.575 m)  Wt 137 lb 9.1 oz (62.4 kg)  BMI 25.15 kg/m2  SpO2 100% Physical Exam  Nursing note and vitals reviewed. Constitutional: She is oriented to person, place, and time. She appears well-developed and well-nourished. No distress.  HENT:  Head: Normocephalic and atraumatic.  Eyes: EOM are normal. Pupils are equal, round, and reactive to light.  Neck: Normal range of motion. Neck supple.  Cardiovascular: Normal rate, normal heart sounds and intact distal pulses.  An irregularly irregular rhythm present.  Pulmonary/Chest: Effort normal. Not tachypneic. No respiratory distress. She has no wheezes. She has rhonchi in the right lower field and the left lower field. She has no rales.  Abdominal: Soft. Bowel sounds are  normal. She exhibits no distension. There is no tenderness. There is no rebound and no guarding.  Musculoskeletal: Normal range of motion. She exhibits no edema and no tenderness.  Neurological: She is alert and oriented to person, place, and time. She exhibits normal muscle tone. Coordination normal.   Skin: Skin is warm and dry. No rash noted. She is not diaphoretic.    ED Course   Procedures (including critical care time)  Labs Reviewed  CBC WITH DIFFERENTIAL - Abnormal; Notable for the following:    WBC 12.5 (*)    RBC 3.27 (*)    Hemoglobin 10.8 (*)    HCT 30.3 (*)    Neutrophils Relative % 78 (*)    Neutro Abs 9.8 (*)    Lymphocytes Relative 11 (*)    Monocytes Absolute 1.2 (*)    All other components within normal limits  COMPREHENSIVE METABOLIC PANEL - Abnormal; Notable for the following:    BUN 50 (*)    Creatinine, Ser 1.47 (*)    AST 41 (*)    GFR calc non Af Amer 30 (*)    GFR calc Af Amer 35 (*)    All other components within normal limits  POCT I-STAT 3, BLOOD GAS (G3+) - Abnormal; Notable for the following:    pO2, Arterial 64.0 (*)    Bicarbonate 26.0 (*)    All other components within normal limits  CULTURE, BLOOD (ROUTINE X 2)  CULTURE, BLOOD (ROUTINE X 2)  URINALYSIS, ROUTINE W REFLEX MICROSCOPIC  TROPONIN I  TSH  TROPONIN I  TROPONIN I  TROPONIN I   Dg Chest 2 View  08/11/2013   CLINICAL DATA:  Altered mental status. Diabetes.  EXAM: CHEST  2 VIEW  COMPARISON:  05/30/2013  FINDINGS: Mild cardiomegaly noted with primarily linear airspace opacities in the lingula and left lower lobe, and indistinct new airspace opacity in the right middle lobe. Faint interstitial accentuation. Atherosclerotic aortic arch. Dextro convex lumbar scoliosis. Splenic artery calcification.  IMPRESSION: 1. Increased airspace opacity in the right middle lobe with similar bandlike opacities in the lingula and left lower lobe. Although some of these have morphology that favors atelectasis, a component of pneumonia is not excluded. Followup to clearance is recommended. 2. Cardiomegaly. 3. Atherosclerosis.   Electronically Signed   By: Herbie Baltimore   On: 08/11/2013 19:06   1. HCAP (healthcare-associated pneumonia)   2. Acute diastolic CHF (congestive heart failure)   3. Atrial  fibrillation      Date: 08/11/2013  Rate: 101  Rhythm: atrial fibrillation  QRS Axis: normal  Intervals: normal  ST/T Wave abnormalities: normal  Conduction Disutrbances:none  Narrative Interpretation:   Old EKG Reviewed: unchanged   MDM  79 y.o. F with a PMH of Afib, DM, HTN, dementia presenting from SNF 2/2 altered mental status and agitation.  Nursing facility found pt to be confused and altered above her baseline as well as agitated approximately 2 hours prior to presentation.  EMS was called and on EMS arrival pt was found to be agitated.  VSS and FSBS normal on EMS arrival.  Stroke screen negative with no aphasia or focal weakness.  On arrival, pt AFVSS, in NAD.  Pt alert and oriented to person, place, day, and president.  EMS reports that this is an improvement from when they first encountered her.  Pt reports mild dyspnea but denies HA, vision change, chest pain, abdominal pain, N/V/D.   Neuro exam with no focal deficits.  Mild b/l rhonchi  on lung exam, exam otherwise WNL.  Given normal neuro exam, will defer CT head, unlikely CVA.  EKG, CXR, troponin, U/A, basic labs obtained.  Results significant for CXR showing opacity in RML as well as in lingula and LLL suspicious for PNA. EKG as above with afib consistent with pt's known history, no evidence of acute ischemia.  Troponin negative.  U/A WNL.  Suspect symptoms 2/2 PNA.  Pt has recent admission in 6/14, will treat for HCAP.    20:20:  Initially planned for outpatient treatment of HCAP, however pt had acutely increased O2 requirement, with O2 sats 91% on 6L.  Pt briefly placed on O2 via NRB with improvement in sats to 100%.  Pt also now diaphoretic and mildly tachypneic.  HR increased to 110s.  Pt able to be weaned from NRB back to 6L via [place].  Given acute change in oxygen requirement and clinical picture, will admit for inpatient treatment of HCAP.  Blood cultures ordered and pt started on vanc/levaquin.  Blood gas obtained shows no CO2  retention.  IV lasix given per hospitalist.  Pt admitted with no further events.  Discussed with attending Dr. Hyacinth Meeker.    Jodean Lima, MD 08/12/13 0020

## 2013-08-11 NOTE — ED Notes (Signed)
Respiratory therapist at bedside.

## 2013-08-11 NOTE — ED Notes (Addendum)
Per MD verbal orders, pt placed back on nasal cannula at 6L/min nasal cannula, to see how the pt does.

## 2013-08-11 NOTE — ED Notes (Signed)
Wet washcloth applied to pt's forehead.

## 2013-08-12 DIAGNOSIS — R0602 Shortness of breath: Secondary | ICD-10-CM

## 2013-08-12 LAB — CBC WITH DIFFERENTIAL/PLATELET
Eosinophils Absolute: 0.2 10*3/uL (ref 0.0–0.7)
Eosinophils Relative: 1 % (ref 0–5)
HCT: 27.9 % — ABNORMAL LOW (ref 36.0–46.0)
Hemoglobin: 9.8 g/dL — ABNORMAL LOW (ref 12.0–15.0)
Lymphs Abs: 0.9 10*3/uL (ref 0.7–4.0)
MCH: 32.6 pg (ref 26.0–34.0)
MCHC: 35.1 g/dL (ref 30.0–36.0)
MCV: 92.7 fL (ref 78.0–100.0)
Monocytes Absolute: 1.1 10*3/uL — ABNORMAL HIGH (ref 0.1–1.0)
Monocytes Relative: 9 % (ref 3–12)
Neutrophils Relative %: 81 % — ABNORMAL HIGH (ref 43–77)
RBC: 3.01 MIL/uL — ABNORMAL LOW (ref 3.87–5.11)

## 2013-08-12 LAB — COMPREHENSIVE METABOLIC PANEL
ALT: 24 U/L (ref 0–35)
AST: 29 U/L (ref 0–37)
Albumin: 3.2 g/dL — ABNORMAL LOW (ref 3.5–5.2)
Alkaline Phosphatase: 58 U/L (ref 39–117)
Calcium: 9 mg/dL (ref 8.4–10.5)
Potassium: 4.7 mEq/L (ref 3.5–5.1)
Sodium: 130 mEq/L — ABNORMAL LOW (ref 135–145)
Total Protein: 6.3 g/dL (ref 6.0–8.3)

## 2013-08-12 LAB — LIPID PANEL
Cholesterol: 131 mg/dL (ref 0–200)
HDL: 76 mg/dL (ref >39)
LDL Cholesterol: 39 mg/dL (ref 0–99)
Total CHOL/HDL Ratio: 1.7 RATIO
Triglycerides: 74 mg/dL (ref <150)
Triglycerides: 83 mg/dL (ref <150)
VLDL: 15 mg/dL (ref 0–40)
VLDL: 17 mg/dL (ref 0–40)

## 2013-08-12 LAB — TROPONIN I: Troponin I: <0.3 ng/mL (ref <0.30)

## 2013-08-12 LAB — GLUCOSE, CAPILLARY: Glucose-Capillary: 404 mg/dL — ABNORMAL HIGH (ref 70–99)

## 2013-08-12 LAB — TSH: TSH: 0.558 u[IU]/mL (ref 0.350–4.500)

## 2013-08-12 MED ORDER — INSULIN ASPART 100 UNIT/ML ~~LOC~~ SOLN
0.0000 [IU] | SUBCUTANEOUS | Status: DC
  Administered 2013-08-12: 3 [IU] via SUBCUTANEOUS
  Administered 2013-08-12: 11 [IU] via SUBCUTANEOUS
  Administered 2013-08-12: 15 [IU] via SUBCUTANEOUS
  Administered 2013-08-13: 8 [IU] via SUBCUTANEOUS
  Administered 2013-08-13 (×2): 5 [IU] via SUBCUTANEOUS
  Administered 2013-08-14 (×2): 15 [IU] via SUBCUTANEOUS
  Administered 2013-08-15: 2 [IU] via SUBCUTANEOUS

## 2013-08-12 MED ORDER — METOPROLOL TARTRATE 25 MG PO TABS
25.0000 mg | ORAL_TABLET | Freq: Two times a day (BID) | ORAL | Status: DC
  Administered 2013-08-12 – 2013-08-17 (×10): 25 mg via ORAL
  Filled 2013-08-12 (×11): qty 1

## 2013-08-12 MED ORDER — ATORVASTATIN CALCIUM 10 MG PO TABS
10.0000 mg | ORAL_TABLET | Freq: Every day | ORAL | Status: DC
  Administered 2013-08-12 – 2013-08-16 (×5): 10 mg via ORAL
  Filled 2013-08-12 (×3): qty 1

## 2013-08-12 MED ORDER — METOLAZONE 5 MG PO TABS
5.0000 mg | ORAL_TABLET | Freq: Every day | ORAL | Status: DC
  Administered 2013-08-12 – 2013-08-17 (×6): 5 mg via ORAL
  Filled 2013-08-12 (×6): qty 1

## 2013-08-12 NOTE — Progress Notes (Signed)
[facility] Progress note  Morgan Morris ZOX:[id number] DOB: January 11, 1920    PCP:   Juline Patch, MD   Chief Complaint: shortness of breath and increase confusion.  HPI: Morgan Morris is an 79 y.o. female nursing home resident PMHx Hx of dementia, afib RVR on anticoagulation with Eliquis, CHF, erosive esophagitis/Mallory-Weiss tear. Recent admission for PNA along with, DM, HTN, brought to the ER as she was having increased confusion and shortness of breath.  Evaluation in the ER included a CXR which showed multiple infiltrate unclear if they were PNA or atelectasis.  She has a WBC of 12K, with Hb of 10.8 g/DL, and Cr of 1.4.  As she was being discharged, she was having more DOE and a little diaphoresis.  She had no ischemic EKG changes and negative initial troponin.  At this point, she was given Levoquin and VAN. Admission weight= 138lbs, current weight=138. TODAY states nursing staff has not been paying attention to her needs. Concern that she has not received medication which immediately makes her feel better. Son at bedside states patient has component of dementia.   Antibiotics Vancomycin day 2/ 7 + Levofloxacin day 2/7   Procedure Echocardiogram 02/24/2013 atrial fibrillation. Normal LV size and systolic function, LVEF 60-65%. Mild LV hypertrophy.  Normal RV size and systolic function. Biatrial enlargement.  Mild-moderate MR, moderate TR. Moderate pulmonary Hypertension.  Echocardiogram pending     Past Medical History  Diagnosis Date  . Hypertension   . Diabetes mellitus   . Depression   . Erosive esophagitis 04/02/2012  . Mallory - Weiss tear 04/02/2012  . Dysrhythmia 02/24/2013    ATRIAL FIB WITH RVR  . Arthritis   . Atrial fibrillation     Past Surgical History  Procedure Laterality Date  . Abdominal hysterectomy    . Appendectomy    . Cholecystectomy    . Esophagogastroduodenoscopy  03/31/2012    Procedure: ESOPHAGOGASTRODUODENOSCOPY (EGD);  Surgeon: Meryl Dare, MD,FACG;  Location: [facility];  Service: Endoscopy;  Laterality: N/A;    Medications:  HOME MEDS: Prior to Admission medications   Medication Sig Start Date End Date Taking? Authorizing Provider  albuterol (PROVENTIL HFA;VENTOLIN HFA) 108 (90 BASE) MCG/ACT inhaler Inhale 2 puffs into the lungs every 6 (six) hours as needed for wheezing. 02/27/13  Yes Richarda Overlie, MD  albuterol (PROVENTIL) (2.5 MG/3ML) 0.083% nebulizer solution Take 2.5 mg by nebulization every 6 (six) hours as needed for wheezing.   Yes Historical Provider, MD  apixaban (ELIQUIS) 2.5 MG TABS tablet Take 1 tablet (2.5 mg total) by mouth 2 (two) times daily. 02/27/13  Yes Richarda Overlie, MD  b complex vitamins tablet Take 1 tablet by mouth daily at 12 noon.   Yes Historical Provider, MD  cholecalciferol (VITAMIN D) 1000 UNITS tablet Take 1,000 Units by mouth daily.   Yes Historical Provider, MD  diltiazem (CARDIZEM CD) 180 MG 24 hr capsule Take 1 capsule (180 mg total) by mouth daily. 06/02/13  Yes Elease Etienne, MD  escitalopram (LEXAPRO) 10 MG tablet Take 20 mg by mouth daily.    Yes Historical Provider, MD  furosemide (LASIX) 20 MG tablet Take 40 mg by mouth daily.   Yes Historical Provider, MD  insulin glargine (LANTUS) 100 UNIT/ML injection Inject 10-20 Units into the skin 2 (two) times daily. 20 units in the am and 10 units in the PM   Yes Historical Provider, MD  insulin regular (NOVOLIN R,HUMULIN R) 100 units/mL injection Inject 0.04-0.25 mLs (4-25 Units total)  into the skin 3 (three) times daily before meals. Per sliding scale: 0 - 120 = 0 units 121 - 150 =2 units 151 - 200 = 4 units 201 - 250 = 6 units 251 - 300 = 10 units >300 = 12 units 04/01/13  Yes Zannie Cove, MD  latanoprost (XALATAN) 0.005 % ophthalmic solution Place 1 drop into both eyes at bedtime.   Yes Historical Provider, MD  metoprolol tartrate (LOPRESSOR) 25 MG tablet Take 0.5 tablets (12.5 mg total) by mouth 2 (two) times daily. 06/02/13  Yes  Elease Etienne, MD  Multiple Vitamins-Minerals (OCUVITE PRESERVISION PO) Take 1 tablet by mouth 2 (two) times daily.   Yes Historical Provider, MD  potassium chloride SA (K-DUR,KLOR-CON) 20 MEQ tablet Take 2 tablets (40 mEq total) by mouth daily. 04/01/13  Yes Zannie Cove, MD  pravastatin (PRAVACHOL) 40 MG tablet Take 40 mg by mouth daily.   Yes Historical Provider, MD  QUEtiapine (SEROQUEL) 25 MG tablet Take 6.25 mg by mouth at bedtime.   Yes Historical Provider, MD  spironolactone (ALDACTONE) 25 MG tablet Take 12.5 mg by mouth every morning.   Yes Historical Provider, MD  timolol (TIMOPTIC) 0.5 % ophthalmic solution Place 1 drop into both eyes daily.   Yes Historical Provider, MD     Allergies:  Allergies  Allergen Reactions  . Penicillins   . Sulfa Antibiotics     Social History:   reports that she has never smoked. She has never used smokeless tobacco. She reports that she does not drink alcohol or use illicit drugs.  Family History: Family History  Problem Relation Age of Onset  . Cancer Mother      Physical Exam: Filed Vitals:   08/11/13 2200 08/11/13 2231 08/11/13 2316 08/12/13 0526  BP: 130/85 130/73  103/64  Pulse: 75 48 90 101  Temp:  97 F (36.1 C)  97.3 F (36.3 C)  TempSrc:  Oral  Oral  Resp: 30 18  18   Height:  5\' 2"  (1.575 m)    Weight:  62.4 kg (137 lb 9.1 oz)  62.8 kg (138 lb 7.2 oz)  SpO2: 100% 100%  100%   Blood pressure 103/64, pulse 101, temperature 97.3 F (36.3 C), temperature source Oral, resp. rate 18, height 5\' 2"  (1.575 m), weight 62.8 kg (138 lb 7.2 oz), SpO2 100.00%.  GEN:  Pleasantly agitated,NAD, cooperative with exam. PSYCH:  alert and oriented x4;  HEART: Irregularly irregular rhythm and rate, negative murmurs rubs or gallops, DP/PT pulse one plus   ABDOMEN: soft and non-tender; no masses, no organomegaly, normal abdominal bowel sounds; no pannus; no intertriginous candida. There is no rebound and no distention. CNS: Cranial nerves  2-12 grossly intact    Labs on Admission:  Basic Metabolic Panel:  Recent Labs Lab 08/11/13 1838  NA 138  K 4.3  CL 99  CO2 26  GLUCOSE 75  BUN 50*  CREATININE 1.47*  CALCIUM 9.6   Liver Function Tests:  Recent Labs Lab 08/11/13 1838  AST 41*  ALT 34  ALKPHOS 67  BILITOT 0.7  PROT 7.2  ALBUMIN 3.5   No results found for this basename: LIPASE, AMYLASE,  in the last 168 hours No results found for this basename: AMMONIA,  in the last 168 hours CBC:  Recent Labs Lab 08/11/13 1838  WBC 12.5*  NEUTROABS 9.8*  HGB 10.8*  HCT 30.3*  MCV 92.7  PLT 231   Cardiac Enzymes:  Recent Labs Lab 08/11/13 1838 08/12/13 0006  08/12/13 0545  TROPONINI <0.30 <0.30 <0.30    CBG: No results found for this basename: GLUCAP,  in the last 168 hours   Radiological Exams on Admission: Dg Chest 2 View  08/11/2013   CLINICAL DATA:  Altered mental status. Diabetes.  EXAM: CHEST  2 VIEW  COMPARISON:  05/30/2013  FINDINGS: Mild cardiomegaly noted with primarily linear airspace opacities in the lingula and left lower lobe, and indistinct new airspace opacity in the right middle lobe. Faint interstitial accentuation. Atherosclerotic aortic arch. Dextro convex lumbar scoliosis. Splenic artery calcification.  IMPRESSION: 1. Increased airspace opacity in the right middle lobe with similar bandlike opacities in the lingula and left lower lobe. Although some of these have morphology that favors atelectasis, a component of pneumonia is not excluded. Followup to clearance is recommended. 2. Cardiomegaly. 3. Atherosclerosis.   Electronically Signed   By: Herbie Baltimore   On: 08/11/2013 19:06   Assessment/Plan Present on Admission:  . Acute diastolic CHF (congestive heart failure) . Atrial fibrillation with RVR . Diabetes mellitus . Shortness of breath . HCAP (healthcare-associated pneumonia)  PLAN:  1. Acute diastolic CHF; obtain 2-D echocardiogram. Troponin x3 negative  2. A. fib with  RVR; patient still slightly tachycardic, however hesitant to increase her Cardizem --Increased Lasix 40 mg 2 twice a day --Increase metoprolol to 25 mg twice a day --Zaroxolyn 5 mg daily  3. DM type 2; continue SSI, obtain lipid panel Hemoglobin A1c 05/29/2013= 6.8  4.HCAP; continue Vancomycin day 2/ 7 + Levofloxacin day 2/7  Code Status: DNR.   Drema Dallas, MD. [facility] Pager [phone] 7pm to 7am.  08/12/2013, 8:45 AM

## 2013-08-12 NOTE — Progress Notes (Signed)
Utilization review completed. Amaree Leeper, RN, BSN. 

## 2013-08-12 NOTE — Progress Notes (Signed)
Pt on bed o to time(date not year), place, self. No complaints of Cp or SOB will continue to monitor

## 2013-08-12 NOTE — Progress Notes (Signed)
CBg 404 MD aware , Pt stated on SSI

## 2013-08-12 NOTE — Progress Notes (Signed)
Admitted pt from ED per stretcher, alert oriented to person, on 2L of O2 per [place], oriented to room and call bell, put on continous telemetry and bed alarm. Will continue to monitor pt

## 2013-08-12 NOTE — Progress Notes (Signed)
Pt pulled out LAc Iv when picking at the tape. Pt appears to have been increasingly anxious and agitated. Will continue to monitor and page MD if needed.

## 2013-08-13 ENCOUNTER — Encounter (HOSPITAL_COMMUNITY): Payer: Self-pay | Admitting: General Practice

## 2013-08-13 DIAGNOSIS — I369 Nonrheumatic tricuspid valve disorder, unspecified: Secondary | ICD-10-CM

## 2013-08-13 DIAGNOSIS — R7881 Bacteremia: Secondary | ICD-10-CM | POA: Diagnosis present

## 2013-08-13 LAB — COMPREHENSIVE METABOLIC PANEL
ALT: 26 U/L (ref 0–35)
Albumin: 3.4 g/dL — ABNORMAL LOW (ref 3.5–5.2)
Calcium: 9.6 mg/dL (ref 8.4–10.5)
GFR calc Af Amer: 33 mL/min — ABNORMAL LOW (ref >90)
Glucose, Bld: 62 mg/dL — ABNORMAL LOW (ref 70–99)
Sodium: 135 mEq/L (ref 135–145)
Total Protein: 6.9 g/dL (ref 6.0–8.3)

## 2013-08-13 LAB — CBC WITH DIFFERENTIAL/PLATELET
HCT: 30.5 % — ABNORMAL LOW (ref 36.0–46.0)
Hemoglobin: 10.6 g/dL — ABNORMAL LOW (ref 12.0–15.0)
Lymphs Abs: 1 10*3/uL (ref 0.7–4.0)
MCH: 32.2 pg (ref 26.0–34.0)
Monocytes Relative: 10 % (ref 3–12)
Neutro Abs: 11 10*3/uL — ABNORMAL HIGH (ref 1.7–7.7)
Neutrophils Relative %: 81 % — ABNORMAL HIGH (ref 43–77)
RBC: 3.29 MIL/uL — ABNORMAL LOW (ref 3.87–5.11)

## 2013-08-13 LAB — MAGNESIUM: Magnesium: 2.3 mg/dL (ref 1.5–2.5)

## 2013-08-13 LAB — GLUCOSE, CAPILLARY
Glucose-Capillary: 157 mg/dL — ABNORMAL HIGH (ref 70–99)
Glucose-Capillary: 266 mg/dL — ABNORMAL HIGH (ref 70–99)
Glucose-Capillary: 211 mg/dL — ABNORMAL HIGH (ref 70–99)
Glucose-Capillary: 208 mg/dL — ABNORMAL HIGH (ref 70–99)

## 2013-08-13 MED ORDER — DEXTROSE 50 % IV SOLN
50.0000 mL | Freq: Once | INTRAVENOUS | Status: AC | PRN
  Administered 2013-08-12: 50 mL via INTRAVENOUS

## 2013-08-13 MED ORDER — CHLORHEXIDINE GLUCONATE CLOTH 2 % EX PADS
6.0000 | MEDICATED_PAD | Freq: Every day | CUTANEOUS | Status: AC
  Administered 2013-08-13 – 2013-08-17 (×5): 6 via TOPICAL

## 2013-08-13 MED ORDER — DEXTROSE 50 % IV SOLN
INTRAVENOUS | Status: AC
  Filled 2013-08-13: qty 50

## 2013-08-13 MED ORDER — MUPIROCIN 2 % EX OINT
1.0000 "application " | TOPICAL_OINTMENT | Freq: Two times a day (BID) | CUTANEOUS | Status: DC
  Administered 2013-08-13 – 2013-08-17 (×9): 1 via NASAL
  Filled 2013-08-13: qty 22

## 2013-08-13 NOTE — Progress Notes (Addendum)
[facility] Progress note  Morgan Morris OZH:[id number] DOB: [birth year]    PCP:   Juline Patch, MD   Chief Complaint: shortness of breath and increase confusion.  HPI: Morgan Morris is an 79 y.o. female nursing home resident PMHx Hx of dementia, afib RVR on anticoagulation with Eliquis, CHF, erosive esophagitis/Mallory-Weiss tear. Recent admission for PNA along with, DM, HTN, brought to the ER as she was having increased confusion and shortness of breath.  Evaluation in the ER included a CXR which showed multiple infiltrate unclear if they were PNA or atelectasis.  She has a WBC of 12K, with Hb of 10.8 g/DL, and Cr of 1.4.  As she was being discharged, she was having more DOE and a little diaphoresis.  She had no ischemic EKG changes and negative initial troponin.  At this point, she was given Levoquin and VAN. Admission weight= 138lbs, current weight=133. TODAY feels a little short of breath, has not been getting out of the bed. Son not at bedside.   Antibiotics Vancomycin day 3/7 + Levofloxacin day 3/7   Procedure Echocardiogram 02/24/2013 atrial fibrillation. Normal LV size and systolic function, LVEF 60-65%. Mild LV hypertrophy.  Normal RV size and systolic function. Biatrial enlargement.  Mild-moderate MR, moderate TR. Moderate pulmonary Hypertension.  Echocardiogram 08/13/2013  - Left ventricle: Septal flattening c/w RV abnormality. The cavity size was normal. Wall thickness was increased in a pattern of mild LVH. LVEF=60%. Regional wall motion abnormalities  cannot be excluded. - Aortic valve: Sclerosis without stenosis. No significant regurgitation. - Right ventricle: The cavity size was moderately dilated. Systolic function was moderately reduced. - Right atrium: The atrium was mildly dilated.   Blood culture 08/12/2013 positive for GRAM POSITIVE COCCI IN CLUSTERS MRSA by PCR positive        Past Medical History  Diagnosis Date  . Hypertension   .  Depression   . Erosive esophagitis 04/02/2012  . Mallory - Weiss tear 04/02/2012  . Dysrhythmia 02/24/2013    ATRIAL FIB WITH RVR  . Arthritis   . Atrial fibrillation   . CHF (congestive heart failure)   . Diabetes mellitus     Past Surgical History  Procedure Laterality Date  . Abdominal hysterectomy    . Appendectomy    . Cholecystectomy    . Esophagogastroduodenoscopy  03/31/2012    Procedure: ESOPHAGOGASTRODUODENOSCOPY (EGD);  Surgeon: Meryl Dare, MD,FACG;  Location: [facility];  Service: Endoscopy;  Laterality: N/A;    Medications:  HOME MEDS: Prior to Admission medications   Medication Sig Start Date End Date Taking? Authorizing Provider  albuterol (PROVENTIL HFA;VENTOLIN HFA) 108 (90 BASE) MCG/ACT inhaler Inhale 2 puffs into the lungs every 6 (six) hours as needed for wheezing. 02/27/13  Yes Richarda Overlie, MD  albuterol (PROVENTIL) (2.5 MG/3ML) 0.083% nebulizer solution Take 2.5 mg by nebulization every 6 (six) hours as needed for wheezing.   Yes Historical Provider, MD  apixaban (ELIQUIS) 2.5 MG TABS tablet Take 1 tablet (2.5 mg total) by mouth 2 (two) times daily. 02/27/13  Yes Richarda Overlie, MD  b complex vitamins tablet Take 1 tablet by mouth daily at 12 noon.   Yes Historical Provider, MD  cholecalciferol (VITAMIN D) 1000 UNITS tablet Take 1,000 Units by mouth daily.   Yes Historical Provider, MD  diltiazem (CARDIZEM CD) 180 MG 24 hr capsule Take 1 capsule (180 mg total) by mouth daily. 06/02/13  Yes Elease Etienne, MD  escitalopram (LEXAPRO) 10 MG tablet Take 20 mg by mouth daily.  Yes Historical Provider, MD  furosemide (LASIX) 20 MG tablet Take 40 mg by mouth daily.   Yes Historical Provider, MD  insulin glargine (LANTUS) 100 UNIT/ML injection Inject 10-20 Units into the skin 2 (two) times daily. 20 units in the am and 10 units in the PM   Yes Historical Provider, MD  insulin regular (NOVOLIN R,HUMULIN R) 100 units/mL injection Inject 0.04-0.25 mLs (4-25 Units total)  into the skin 3 (three) times daily before meals. Per sliding scale: 0 - 120 = 0 units 121 - 150 =2 units 151 - 200 = 4 units 201 - 250 = 6 units 251 - 300 = 10 units >300 = 12 units 04/01/13  Yes Zannie Cove, MD  latanoprost (XALATAN) 0.005 % ophthalmic solution Place 1 drop into both eyes at bedtime.   Yes Historical Provider, MD  metoprolol tartrate (LOPRESSOR) 25 MG tablet Take 0.5 tablets (12.5 mg total) by mouth 2 (two) times daily. 06/02/13  Yes Elease Etienne, MD  Multiple Vitamins-Minerals (OCUVITE PRESERVISION PO) Take 1 tablet by mouth 2 (two) times daily.   Yes Historical Provider, MD  potassium chloride SA (K-DUR,KLOR-CON) 20 MEQ tablet Take 2 tablets (40 mEq total) by mouth daily. 04/01/13  Yes Zannie Cove, MD  pravastatin (PRAVACHOL) 40 MG tablet Take 40 mg by mouth daily.   Yes Historical Provider, MD  QUEtiapine (SEROQUEL) 25 MG tablet Take 6.25 mg by mouth at bedtime.   Yes Historical Provider, MD  spironolactone (ALDACTONE) 25 MG tablet Take 12.5 mg by mouth every morning.   Yes Historical Provider, MD  timolol (TIMOPTIC) 0.5 % ophthalmic solution Place 1 drop into both eyes daily.   Yes Historical Provider, MD     Allergies:  Allergies  Allergen Reactions  . Penicillins   . Sulfa Antibiotics     Social History:   reports that she has never smoked. She has never used smokeless tobacco. She reports that she does not drink alcohol or use illicit drugs.  Family History: Family History  Problem Relation Age of Onset  . Cancer Mother      Physical Exam: Filed Vitals:   08/13/13 1016 08/13/13 1459 08/13/13 1552 08/13/13 1816  BP: 119/72 123/74  114/68  Pulse:  94  91  Temp:  98.4 F (36.9 C)    TempSrc:      Resp:  17  18  Height:      Weight:      SpO2:  94% 98% 94%   Blood pressure 114/68, pulse 91, temperature 98.4 F (36.9 C), temperature source Oral, resp. rate 18, height 5\' 2"  (1.575 m), weight 60.5 kg (133 lb 6.1 oz), SpO2 94.00%.  GEN:   Pleasantly agitated,NAD, cooperative with exam. PSYCH:  alert and oriented x4;  HEART: Irregularly irregular rhythm and rate, negative murmurs rubs or gallops, DP/PT pulse one plus   ABDOMEN: soft and non-tender; no masses, no organomegaly, normal abdominal bowel sounds; no pannus; no intertriginous candida. There is no rebound and no distention. CNS: Cranial nerves 2-12 grossly intact    Labs on Admission:  Basic Metabolic Panel:  Recent Labs Lab 08/11/13 1838 08/12/13 1038 08/13/13 0500  NA 138 130* 135  K 4.3 4.7 3.8  CL 99 93* 96  CO2 26 23 25   GLUCOSE 75 403* 62*  BUN 50* 50* 49*  CREATININE 1.47* 1.48* 1.53*  CALCIUM 9.6 9.0 9.6  MG  --  2.2 2.3   Liver Function Tests:  Recent Labs Lab 08/11/13 1838 08/12/13 1038  08/13/13 0500  AST 41* 29 29  ALT 34 24 26  ALKPHOS 67 58 63  BILITOT 0.7 1.0 1.1  PROT 7.2 6.3 6.9  ALBUMIN 3.5 3.2* 3.4*   No results found for this basename: LIPASE, AMYLASE,  in the last 168 hours No results found for this basename: AMMONIA,  in the last 168 hours CBC:  Recent Labs Lab 08/11/13 1838 08/12/13 1038 08/13/13 0500  WBC 12.5* 11.6* 13.6*  NEUTROABS 9.8* 9.3* 11.0*  HGB 10.8* 9.8* 10.6*  HCT 30.3* 27.9* 30.5*  MCV 92.7 92.7 92.7  PLT 231 190 226   Cardiac Enzymes:  Recent Labs Lab 08/11/13 1838 08/12/13 0006 08/12/13 0545 08/12/13 1038  TROPONINI <0.30 <0.30 <0.30 <0.30    CBG:  Recent Labs Lab 08/13/13 0054 08/13/13 0424 08/13/13 0759 08/13/13 1156 08/13/13 1637  GLUCAP 157* 73 135* 266* 211*     Radiological Exams on Admission: No results found. Assessment/Plan Present on Admission:  . Acute diastolic CHF (congestive heart failure) . Atrial fibrillation with RVR . Diabetes mellitus . Shortness of breath . HCAP (healthcare-associated pneumonia) . Erosive esophagitis . Bacteremia  PLAN:  1. Acute diastolic CHF; obtained 2-D echocardiogram. Which continues to show some mild CHF, stable at this  point.   2. A. fib with RVR; patient's heart rate better controlled --Increased Lasix 40 mg 2 twice a day --Increase metoprolol to 25 mg twice a day --Zaroxolyn 5 mg daily --Consider decreasing diuretics in the a.m.  3. DM type 2; continue SSI, obtain lipid panel Hemoglobin A1c 05/29/2013= 6.8  4.HCAP; continue Vancomycin day 3/ 7 + Levofloxacin day 3/7  5. Bacteremia; patient's blood cultures positive for staph, sensitivities not back yet  6. Hx of Dementia; Baseline; Not admitted for this diagnosis  --- Continue to treat empirically as MRSA.  see #4  Code Status: DNR.   Drema Dallas, MD. [facility] Pager [phone] 7pm to 7am.  08/13/2013, 7:34 PM

## 2013-08-13 NOTE — Progress Notes (Signed)
Pt BS=30 by CBG @ 1130 pm .Pt conscious and coherent but weak. D50 given by iv . Rechecked BS at 1155pm =157. Continued to observe pt closely .

## 2013-08-13 NOTE — Progress Notes (Signed)
Inpatient Diabetes Program Recommendations  AACE/ADA: New Consensus Statement on Inpatient Glycemic Control (2013)  Target Ranges:  Prepandial:   less than 140 mg/dL      Peak postprandial:   less than 180 mg/dL (1-2 hours)      Critically ill patients:  140 - 180 mg/dL   Reason for Visit: Results for Morgan Morris, Morgan Morris (MRN [id number]) as of 08/13/2013 08:51  Ref. Range 08/12/2013 12:27 08/12/2013 16:07 08/12/2013 19:55 08/13/2013 00:32 08/13/2013 00:33 08/13/2013 00:54  Glucose-Capillary Latest Range: 70-99 mg/dL 782 (H) 956 (H) 213 (H) 27 (LL) 30 (LL) 157 (H)   Note hypoglycemia overnight.  Likely due to Novolog correction.  Consider reducing Novolog to sensitive tid with meals and HS coverage.  Will follow.

## 2013-08-13 NOTE — Progress Notes (Signed)
Echocardiogram 2D Echocardiogram has been performed.  Morgan Morris 08/13/2013, 12:45 PM

## 2013-08-13 NOTE — Progress Notes (Signed)
Hypoglycemic Event  CBG: 30  Treatment: D50 IV 50 mL  Symptoms: Shaky  Follow-up CBG: Time:1245 CBG Result:157  Possible Reasons for Event:   Comments/MD notified:pt conscious /coherent but weak    [name], Morgan Morris  Remember to initiate Hypoglycemia Order Set & complete

## 2013-08-13 NOTE — Progress Notes (Signed)
Pt alert and oriented with moments of forgetfulness, NAD noted, able to communicate needs, echo completed today, tolerated well, family at bedside, will continue to monitor

## 2013-08-13 NOTE — Clinical Documentation Improvement (Signed)
THIS DOCUMENT IS NOT A PERMANENT PART OF THE MEDICAL RECORD  Please update your documentation with the medical record to reflect your response to this query. If you need help knowing how to do this please call [phone]  08/13/13  Dr. Joseph Art,  In an effort to better capture your patient's severity of illness, reflect appropriate length of stay and utilization of resources, a review of the patient medical record has revealed the following information:   - Known history of dementia   - Described as having  Altered mental status, increased confusion and agitation prompting evaluation in ED    Based on your clinical judgment, please document in the progress notes and discharge summary if a condition below provides greater specificity regarding the patient's mental status:   - Dementia with a behavioral disturbance; Had a Hx of Dementia. Not admitted for this Dx.    - Other Condition   - Unable to Clinically Determine    In responding to this query please exercise your independent judgment.     The fact that a query is asked, does not imply that any particular answer is desired or expected.    Reviewed: 08/24/2013  Thank You,  Jerral Ralph  RN BSN CCDS Certified Clinical Documentation Specialist: [phone] Health Information Management [place]

## 2013-08-13 NOTE — Evaluation (Signed)
Physical Therapy Evaluation Patient Details Name: Morgan Morris MRN: [id number] DOB: [birth year] Today's Date: 08/13/2013 Time: 4098-1191 PT Time Calculation (min): 15 min  PT Assessment / Plan / Recommendation History of Present Illness  79 y.o. female admitted to [facility] on 08/11/13 with increased confusion and SOB.  Dx with acute on chronic CHF and CAP.    Clinical Impression  This mildly confused elderly female with STM deficits is moving well with very little DOE and only minimal assist to move around room and preform grooming tasks at sink. She is appropriate to return to ALF with HHPT f/u there.  PT will continue to follow acutely for the deficits listed below.      PT Assessment  Patient needs continued PT services    Follow Up Recommendations  Home health PT;Other (comment) (at ALF)    Does the patient have the potential to tolerate intense rehabilitation     NA  Barriers to Discharge   None None    Equipment Recommendations  None recommended by PT    Recommendations for Other Services   None  Frequency Min 3X/week    Precautions / Restrictions Precautions Precautions: Fall   Pertinent Vitals/Pain See vitals flow sheet.       Mobility  Bed Mobility Bed Mobility: Supine to Sit;Sitting - Scoot to Edge of Bed;Sit to Supine Supine to Sit: 4: Min assist;With rails;HOB elevated Sitting - Scoot to Edge of Bed: 4: Min assist;With rail Sit to Supine: 4: Min assist;With rail;HOB flat Details for Bed Mobility Assistance: Min assist to support trunk for balance.   Transfers Transfers: Sit to Stand;Stand to Sit Sit to Stand: 4: Min assist;With upper extremity assist;With armrests;From bed Stand to Sit: 4: Min assist;With upper extremity assist;With armrests;To bed;To chair/3-in-1 Details for Transfer Assistance: min assist to stand to steady pt for balance from bed and BSC.   Ambulation/Gait Ambulation/Gait Assistance: 4: Min assist Ambulation Distance (Feet): 30 Feet  (15'x2) Assistive device: Rolling walker Ambulation/Gait Assistance Details: slow shuffling gait pattern with posterior lean initially upon standing.   General Gait Details: mild increase in DOE with gait and mobility on RA, but sats mid to upper 90s.         PT Diagnosis: Difficulty walking;Abnormality of gait;Generalized weakness;Altered mental status  PT Problem List: Decreased strength;Decreased activity tolerance;Decreased balance;Decreased mobility;Decreased cognition;Decreased knowledge of use of DME;Cardiopulmonary status limiting activity PT Treatment Interventions: DME instruction;Gait training;Functional mobility training;Therapeutic activities;Therapeutic exercise;Balance training;Neuromuscular re-education;Cognitive remediation;Patient/family education     PT Goals(Current goals can be found in the care plan section) Acute Rehab PT Goals Patient Stated Goal: to go back to ALF PT Goal Formulation: With patient Time For Goal Achievement: 08/27/13 Potential to Achieve Goals: Good  Visit Information  Last PT Received On: 08/13/13 Assistance Needed: +1 History of Present Illness: 79 y.o. female admitted to [facility] on 08/11/13 with increased confusion and SOB.  Dx with acute on chronic CHF and CAP.         Prior Functioning  Home Living Family/patient expects to be discharged to:: Assisted living Home Equipment: Walker - 2 wheels Additional Comments: pt from assisted living.  h/o dementia.  STM deficits.   Prior Function Level of Independence: Needs assistance Gait / Transfers Assistance Needed: supervision  ADL's / Homemaking Assistance Needed: Supervision/set up for sponge bath Communication / Swallowing Assistance Needed: none Comments: Pt used a RW PTA Communication Communication: HOH Dominant Hand: Right    Cognition  Cognition Arousal/Alertness: Awake/alert Behavior During Therapy: WFL for tasks  assessed/performed Overall Cognitive Status: History of cognitive  impairments - at baseline Memory: Decreased short-term memory    Extremity/Trunk Assessment Upper Extremity Assessment Upper Extremity Assessment: Overall WFL for tasks assessed Lower Extremity Assessment Lower Extremity Assessment: Generalized weakness Cervical / Trunk Assessment Cervical / Trunk Assessment: Normal      End of Session PT - End of Session Activity Tolerance: Patient limited by fatigue Patient left: in bed;with call bell/phone within reach;with chair alarm set    Keelin Neville B. Tayjon Halladay, PT, DPT [phone]   08/13/2013, 5:26 PM

## 2013-08-14 ENCOUNTER — Inpatient Hospital Stay (HOSPITAL_COMMUNITY): Payer: Medicare Other

## 2013-08-14 DIAGNOSIS — E119 Type 2 diabetes mellitus without complications: Secondary | ICD-10-CM

## 2013-08-14 LAB — CBC WITH DIFFERENTIAL/PLATELET
Eosinophils Absolute: 0.7 10*3/uL (ref 0.0–0.7)
Eosinophils Relative: 6 % — ABNORMAL HIGH (ref 0–5)
HCT: 30.9 % — ABNORMAL LOW (ref 36.0–46.0)
Hemoglobin: 10.9 g/dL — ABNORMAL LOW (ref 12.0–15.0)
Lymphocytes Relative: 14 % (ref 12–46)
Lymphs Abs: 1.7 10*3/uL (ref 0.7–4.0)
MCH: 32.5 pg (ref 26.0–34.0)
MCV: 92.2 fL (ref 78.0–100.0)
Monocytes Relative: 12 % (ref 3–12)
Platelets: 258 10*3/uL (ref 150–400)
RBC: 3.35 MIL/uL — ABNORMAL LOW (ref 3.87–5.11)
WBC: 12.2 10*3/uL — ABNORMAL HIGH (ref 4.0–10.5)

## 2013-08-14 LAB — COMPREHENSIVE METABOLIC PANEL
ALT: 19 U/L (ref 0–35)
AST: 22 U/L (ref 0–37)
Albumin: 3.2 g/dL — ABNORMAL LOW (ref 3.5–5.2)
CO2: 28 mEq/L (ref 19–32)
Calcium: 9.5 mg/dL (ref 8.4–10.5)
Creatinine, Ser: 1.53 mg/dL — ABNORMAL HIGH (ref 0.50–1.10)
GFR calc non Af Amer: 29 mL/min — ABNORMAL LOW (ref >90)
Sodium: 137 mEq/L (ref 135–145)
Total Protein: 6.9 g/dL (ref 6.0–8.3)

## 2013-08-14 LAB — GLUCOSE, CAPILLARY
Glucose-Capillary: 69 mg/dL — ABNORMAL LOW (ref 70–99)
Glucose-Capillary: 109 mg/dL — ABNORMAL HIGH (ref 70–99)

## 2013-08-14 LAB — CULTURE, BLOOD (ROUTINE X 2)

## 2013-08-14 NOTE — Progress Notes (Signed)
Patients CBG 484.  Morgan Morris on call made aware.  Orders received to give 15 units of novolog.  Patient will also receive scheduled lantus 10 units tonight.  Will continue to monitor patient.

## 2013-08-14 NOTE — Progress Notes (Signed)
The patient's CBG was 69 at 0340.  The patient drank orange juice and ate peanut butter and graham crackers.  The CBG recheck was 109.

## 2013-08-14 NOTE — Progress Notes (Signed)
[facility] PROGRESS NOTE  Morgan Morris ZOX:[id number] DOB: [birth year] DOA: 08/11/2013 PCP: Juline Patch, MD  HPI/Subjective: Feels much better, still complaining about weakness.  Assessment/Plan:  Healthcare associated pneumonia  -Patient treated with vancomycin and Levaquin. -No fever or chills, shortness of breath is improving.  Coagulase negative staph bacteremia -1/2 blood cultures positive for coagulase-negative staph. -Patient does not look septic, anyway she was on vancomycin.  -I think this represent contamination of the blood culture.  Atrial fibrillation with rapid ventricular response -Heart rate is controlled now with metoprolol and Cardizem. -Patient is on Eliquis for anticoagulation.  Acute on chronic diastolic CHF -2-D echocardiogram showed LVEF of around 60%. -Patient is on metolazone, spironolactone and furosemide. She is back to her home regimen.  Diabetes mellitus type 2 -Hemoglobin A1c is 6.8 on 05/29/2013 indicating tight glycemic control.  Code Status: DO NOT RESUSCITATE Family Communication: Plan discussed with the patient, spoke with her son at bedside.. Disposition Plan: Remains inpatient, PT/OT to evaluate and treat. Likely she'll need SNF, son prefers [facility]   Consultants:  Cardiology  Procedures:  None  Antibiotics:  Levaquin and vancomycin   Objective: Filed Vitals:   08/14/13 0355  BP: 111/68  Pulse: 94  Temp: 97.5 F (36.4 C)  Resp: 20    Intake/Output Summary (Last 24 hours) at 08/14/13 1203 Last data filed at 08/14/13 0910  Gross per 24 hour  Intake   1233 ml  Output   1550 ml  Net   -317 ml   Filed Weights   08/12/13 0855 08/13/13 0511 08/14/13 0355  Weight: 62.8 kg (138 lb 7.2 oz) 60.5 kg (133 lb 6.1 oz) 61.1 kg (134 lb 11.2 oz)    Exam: General: Alert and awake, oriented x3, not in any acute distress. HEENT: anicteric sclera, pupils reactive to light and accommodation, EOMI CVS: S1-S2 clear, no  murmur rubs or gallops Chest: clear to auscultation bilaterally, no wheezing, rales or rhonchi Abdomen: soft nontender, nondistended, normal bowel sounds, no organomegaly Extremities: no cyanosis, clubbing or edema noted bilaterally Neuro: Cranial nerves II-XII intact, no focal neurological deficits  Data Reviewed: Basic Metabolic Panel:  Recent Labs Lab 08/11/13 1838 08/12/13 1038 08/13/13 0500 08/14/13 0353  NA 138 130* 135 137  K 4.3 4.7 3.8 3.5  CL 99 93* 96 97  CO2 26 23 25 28   GLUCOSE 75 403* 62* 51*  BUN 50* 50* 49* 45*  CREATININE 1.47* 1.48* 1.53* 1.53*  CALCIUM 9.6 9.0 9.6 9.5  MG  --  2.2 2.3 2.2   Liver Function Tests:  Recent Labs Lab 08/11/13 1838 08/12/13 1038 08/13/13 0500 08/14/13 0353  AST 41* 29 29 22   ALT 34 24 26 19   ALKPHOS 67 58 63 62  BILITOT 0.7 1.0 1.1 0.8  PROT 7.2 6.3 6.9 6.9  ALBUMIN 3.5 3.2* 3.4* 3.2*   No results found for this basename: LIPASE, AMYLASE,  in the last 168 hours No results found for this basename: AMMONIA,  in the last 168 hours CBC:  Recent Labs Lab 08/11/13 1838 08/12/13 1038 08/13/13 0500 08/14/13 0353  WBC 12.5* 11.6* 13.6* 12.2*  NEUTROABS 9.8* 9.3* 11.0* 8.3*  HGB 10.8* 9.8* 10.6* 10.9*  HCT 30.3* 27.9* 30.5* 30.9*  MCV 92.7 92.7 92.7 92.2  PLT 231 190 226 258   Cardiac Enzymes:  Recent Labs Lab 08/11/13 1838 08/12/13 0006 08/12/13 0545 08/12/13 1038  TROPONINI <0.30 <0.30 <0.30 <0.30   BNP (last 3 results)  Recent Labs  03/31/13 0425 05/28/13 1752  08/12/13 1826  PROBNP 2796.0* 16021.0* 5176.0*   CBG:  Recent Labs Lab 08/14/13 0034 08/14/13 0333 08/14/13 0507 08/14/13 0759 08/14/13 1151  GLUCAP 106* 69* 109* 92 244*    Micro Recent Results (from the past 240 hour(s))  CULTURE, BLOOD (ROUTINE X 2)     Status: None   Collection Time    08/11/13 12:06 AM      Result Value Range Status   Specimen Description BLOOD LEFT HAND   Final   Special Requests BOTTLES DRAWN AEROBIC  ONLY 6CC   Final   Culture  Setup Time     Final   Value: 08/12/2013 08:04     Performed at [facility]   Culture     Final   Value:        BLOOD CULTURE RECEIVED NO GROWTH TO DATE CULTURE WILL BE HELD FOR 5 DAYS BEFORE ISSUING A FINAL NEGATIVE REPORT     Performed at [facility]   Report Status PENDING   Incomplete  CULTURE, BLOOD (ROUTINE X 2)     Status: None   Collection Time    08/12/13 12:20 AM      Result Value Range Status   Specimen Description BLOOD RIGHT HAND   Final   Special Requests BOTTLES DRAWN AEROBIC ONLY 6CC   Final   Culture  Setup Time     Final   Value: 08/12/2013 08:04     Performed at [facility]   Culture     Final   Value: STAPHYLOCOCCUS SPECIES (COAGULASE NEGATIVE)     Note: THE SIGNIFICANCE OF ISOLATING THIS ORGANISM FROM A SINGLE SET OF BLOOD CULTURES WHEN MULTIPLE SETS ARE DRAWN IS UNCERTAIN. PLEASE NOTIFY THE MICROBIOLOGY DEPARTMENT WITHIN ONE WEEK IF SPECIATION AND SENSITIVITIES ARE REQUIRED.     0350 Note: Gram Stain Report Called to,Read Back By and Verified With: LONNIE COLEMDIES 08/13/2013 [facility]     Performed at [facility]   Report Status 08/14/2013 FINAL   Final  MRSA PCR SCREENING     Status: Abnormal   Collection Time    08/12/13  7:28 AM      Result Value Range Status   MRSA by PCR POSITIVE (*) NEGATIVE Final   Comment:            The GeneXpert MRSA Assay (FDA     approved for NASAL specimens     only), is one component of a     comprehensive MRSA colonization     surveillance program. It is not     intended to diagnose MRSA     infection nor to guide or     monitor treatment for     MRSA infections.     RESULT CALLED TO, READ BACK BY AND VERIFIED WITH:     TRIPP A RN 08/12/13 0929 COSTELLO B     Studies: No results found.  Scheduled Meds: . apixaban  2.5 mg Oral BID  . atorvastatin  10 mg Oral q1800  . B-complex with vitamin C  1 tablet Oral Daily  . Chlorhexidine Gluconate Cloth  6 each  Topical Q0600  . cholecalciferol  1,000 Units Oral Daily  . diltiazem  180 mg Oral Daily  . docusate sodium  100 mg Oral BID  . escitalopram  20 mg Oral Daily  . furosemide  40 mg Intravenous Daily  . insulin aspart  0-15 Units Subcutaneous Q4H  . insulin glargine  20 Units Subcutaneous Daily  And  . insulin glargine  10 Units Subcutaneous QHS  . latanoprost  1 drop Both Eyes QHS  . levofloxacin (LEVAQUIN) IV  500 mg Intravenous Q48H  . metolazone  5 mg Oral Daily  . metoprolol tartrate  25 mg Oral BID  . multivitamin-lutein  1 capsule Oral BID  . mupirocin ointment  1 application Nasal BID  . potassium chloride SA  40 mEq Oral Daily  . sodium chloride  3 mL Intravenous Q12H  . spironolactone  12.5 mg Oral q morning - 10a  . timolol  1 drop Both Eyes Daily  . vancomycin  500 mg Intravenous Once  . vancomycin  750 mg Intravenous Q24H   Continuous Infusions:   Active Problems:   Erosive esophagitis   Shortness of breath   Acute diastolic CHF (congestive heart failure)   Atrial fibrillation with RVR   Diabetes mellitus   HCAP (healthcare-associated pneumonia)   Bacteremia    Time spent: 35 minutes    [facility] A  [facility] Pager [phone] If 7PM-7AM, please contact night-coverage at [email or web address], password [facility] 08/14/2013, 12:03 PM  LOS: 3 days

## 2013-08-14 NOTE — Progress Notes (Signed)
ANTIBIOTIC CONSULT NOTE - Follow Up  Pharmacy Consult for Levaquin, Vancomycin Indication: HCAP  Allergies  Allergen Reactions  . Penicillins   . Sulfa Antibiotics     Patient Measurements: Height: 5\' 2"  (157.5 cm) Weight: 134 lb 11.2 oz (61.1 kg) IBW/kg (Calculated) : 50.1  Labs:  Recent Labs  08/12/13 1038 08/13/13 0500 08/14/13 0353  WBC 11.6* 13.6* 12.2*  HGB 9.8* 10.6* 10.9*  PLT 190 226 258  CREATININE 1.48* 1.53* 1.53*   Estimated Creatinine Clearance: 21 ml/min (by C-G formula based on Cr of 1.53).   Microbiology: Recent Results (from the past 720 hour(s))  CULTURE, BLOOD (ROUTINE X 2)     Status: None   Collection Time    08/11/13 12:06 AM      Result Value Range Status   Specimen Description BLOOD LEFT HAND   Final   Special Requests BOTTLES DRAWN AEROBIC ONLY 6CC   Final   Culture  Setup Time     Final   Value: 08/12/2013 08:04     Performed at [facility]   Culture     Final   Value:        BLOOD CULTURE RECEIVED NO GROWTH TO DATE CULTURE WILL BE HELD FOR 5 DAYS BEFORE ISSUING A FINAL NEGATIVE REPORT     Performed at [facility]   Report Status PENDING   Incomplete  CULTURE, BLOOD (ROUTINE X 2)     Status: None   Collection Time    08/12/13 12:20 AM      Result Value Range Status   Specimen Description BLOOD RIGHT HAND   Final   Special Requests BOTTLES DRAWN AEROBIC ONLY 6CC   Final   Culture  Setup Time     Final   Value: 08/12/2013 08:04     Performed at [facility]   Culture     Final   Value: STAPHYLOCOCCUS SPECIES (COAGULASE NEGATIVE)     Note: THE SIGNIFICANCE OF ISOLATING THIS ORGANISM FROM A SINGLE SET OF BLOOD CULTURES WHEN MULTIPLE SETS ARE DRAWN IS UNCERTAIN. PLEASE NOTIFY THE MICROBIOLOGY DEPARTMENT WITHIN ONE WEEK IF SPECIATION AND SENSITIVITIES ARE REQUIRED.     0350 Note: Gram Stain Report Called to,Read Back By and Verified With: LONNIE COLEMDIES 08/13/2013 [facility]     Performed at [facility]   Report Status 08/14/2013 FINAL   Final  MRSA PCR SCREENING     Status: Abnormal   Collection Time    08/12/13  7:28 AM      Result Value Range Status   MRSA by PCR POSITIVE (*) NEGATIVE Final   Comment:            The GeneXpert MRSA Assay (FDA     approved for NASAL specimens     only), is one component of a     comprehensive MRSA colonization     surveillance program. It is not     intended to diagnose MRSA     infection nor to guide or     monitor treatment for     MRSA infections.     RESULT CALLED TO, READ BACK BY AND VERIFIED WITH:     TRIPP A RN 08/12/13 0929 COSTELLO B    Assessment: 79 year old female from [facility] admitted with SOB, confusion, and AMS.  Started on Levaquin and vancomycin for presumed HCAP.  Patient's renal function is poor, but has stayed stable. Her blood culture frew 1/2 coag neg staph which  is likely a contaminate. Other cultures are negative.   Goal of Therapy:  Vancomycin trough level 15-20 mcg/ml Appropriate Levaquin dosing  Plan:  1) Levaquin 500 mg IV q48h 2) Vancomycin 750 mg IV q24h 3) Follow up progress, renal function, LOT, de-escalation  Lacye Mccarn D. Avigdor Dollar, PharmD Clinical Pharmacist Pager: [phone] 08/14/2013 2:12 PM

## 2013-08-15 DIAGNOSIS — E162 Hypoglycemia, unspecified: Secondary | ICD-10-CM

## 2013-08-15 LAB — GLUCOSE, CAPILLARY
Glucose-Capillary: 108 mg/dL — ABNORMAL HIGH (ref 70–99)
Glucose-Capillary: 115 mg/dL — ABNORMAL HIGH (ref 70–99)
Glucose-Capillary: 173 mg/dL — ABNORMAL HIGH (ref 70–99)
Glucose-Capillary: 474 mg/dL — ABNORMAL HIGH (ref 70–99)
Glucose-Capillary: 53 mg/dL — ABNORMAL LOW (ref 70–99)
Glucose-Capillary: 69 mg/dL — ABNORMAL LOW (ref 70–99)

## 2013-08-15 LAB — CBC WITH DIFFERENTIAL/PLATELET
Basophils Absolute: 0.1 10*3/uL (ref 0.0–0.1)
Basophils Relative: 1 % (ref 0–1)
Eosinophils Absolute: 0.9 10*3/uL — ABNORMAL HIGH (ref 0.0–0.7)
Eosinophils Relative: 6 % — ABNORMAL HIGH (ref 0–5)
HCT: 34.2 % — ABNORMAL LOW (ref 36.0–46.0)
MCH: 32.7 pg (ref 26.0–34.0)
MCHC: 36 g/dL (ref 30.0–36.0)
MCV: 91 fL (ref 78.0–100.0)
Monocytes Absolute: 1.7 10*3/uL — ABNORMAL HIGH (ref 0.1–1.0)
Platelets: 298 10*3/uL (ref 150–400)
RDW: 15.2 % (ref 11.5–15.5)

## 2013-08-15 LAB — COMPREHENSIVE METABOLIC PANEL
AST: 23 U/L (ref 0–37)
Albumin: 3.4 g/dL — ABNORMAL LOW (ref 3.5–5.2)
Alkaline Phosphatase: 65 U/L (ref 39–117)
BUN: 41 mg/dL — ABNORMAL HIGH (ref 6–23)
CO2: 28 mEq/L (ref 19–32)
Chloride: 96 mEq/L (ref 96–112)
GFR calc non Af Amer: 35 mL/min — ABNORMAL LOW (ref >90)
Potassium: 3.1 mEq/L — ABNORMAL LOW (ref 3.5–5.1)
Total Bilirubin: 0.8 mg/dL (ref 0.3–1.2)

## 2013-08-15 MED ORDER — DEXTROSE 50 % IV SOLN: 50.0000 mL | Freq: Once | INTRAVENOUS | Status: DC

## 2013-08-15 MED ORDER — INSULIN ASPART 100 UNIT/ML ~~LOC~~ SOLN: 0.0000 [IU] | SUBCUTANEOUS | Status: DC

## 2013-08-15 MED ORDER — LEVOFLOXACIN 500 MG PO TABS
500.0000 mg | ORAL_TABLET | ORAL | Status: DC
  Administered 2013-08-15: 500 mg via ORAL
  Filled 2013-08-15 (×3): qty 1

## 2013-08-15 MED ORDER — INSULIN ASPART 100 UNIT/ML ~~LOC~~ SOLN
0.0000 [IU] | SUBCUTANEOUS | Status: DC
  Administered 2013-08-15: 9 [IU] via SUBCUTANEOUS
  Administered 2013-08-15: 3 [IU] via SUBCUTANEOUS
  Administered 2013-08-15 (×2): 2 [IU] via SUBCUTANEOUS
  Administered 2013-08-16: 9 [IU] via SUBCUTANEOUS
  Administered 2013-08-16: 3 [IU] via SUBCUTANEOUS
  Administered 2013-08-16: 7 [IU] via SUBCUTANEOUS
  Administered 2013-08-16 – 2013-08-17 (×2): 3 [IU] via SUBCUTANEOUS

## 2013-08-15 NOTE — Progress Notes (Signed)
[facility] PROGRESS NOTE  Morgan Morris:[id number] DOB: 1920/09/02 DOA: 08/11/2013 PCP: Juline Patch, MD  HPI/Subjective: Had an episode of significant hypoglycemia with CBG of 39 last night. Feeling weak this morning. Blood sugar was over 480 yesterday.   Assessment/Plan:  Healthcare associated pneumonia  -Patient treated with vancomycin and Levaquin. -No fever or chills, shortness of breath is improving.  Hypoglycemia -Brittle diabetes, [name], [name] -Will decrease the Lantus insulin, adjust according to the fasting blood glucose.  Coagulase negative staph bacteremia -1/2 blood cultures positive for coagulase-negative staph. -Patient does not look septic, anyway she was on vancomycin.  -I think this represent contamination of the blood culture.  Atrial fibrillation with rapid ventricular response -Heart rate is controlled now with metoprolol and Cardizem. -Patient is on Eliquis for anticoagulation.  Acute on chronic diastolic CHF -2-D echocardiogram showed LVEF of around 60%. -Patient is on metolazone, spironolactone and furosemide. She is back to her home regimen.  Diabetes mellitus type 2 -Hemoglobin A1c is 6.8 on 05/29/2013 indicating controlled diabetes mellitus type 2.  Code Status: DO NOT RESUSCITATE Family Communication: Plan discussed with the patient, spoke with her son at bedside.. Disposition Plan: Remains inpatient, PT/OT to evaluate and treat. Likely she'll need SNF, son prefers [facility]   Consultants:  Cardiology  Procedures:  None  Antibiotics:  Levaquin and vancomycin   Objective: Filed Vitals:   08/15/13 0715  BP:   Pulse:   Temp: 97.3 F (36.3 C)  Resp:     Intake/Output Summary (Last 24 hours) at 08/15/13 1134 Last data filed at 08/14/13 2333  Gross per 24 hour  Intake    853 ml  Output   1800 ml  Net   -947 ml   Filed Weights   08/13/13 0511  08/14/13 0355 08/15/13 0637  Weight: 60.5 kg (133 lb 6.1 oz) 61.1 kg (134 lb 11.2 oz) 60.555 kg (133 lb 8 oz)    Exam: General: Alert and awake, oriented x3, not in any acute distress. HEENT: anicteric sclera, pupils reactive to light and accommodation, EOMI CVS: S1-S2 clear, no murmur rubs or gallops Chest: clear to auscultation bilaterally, no wheezing, rales or rhonchi Abdomen: soft nontender, nondistended, normal bowel sounds, no organomegaly Extremities: no cyanosis, clubbing or edema noted bilaterally Neuro: Cranial nerves II-XII intact, no focal neurological deficits  Data Reviewed: Basic Metabolic Panel:  Recent Labs Lab 08/11/13 1838 08/12/13 1038 08/13/13 0500 08/14/13 0353 08/15/13 0400  NA 138 130* 135 137 137  K 4.3 4.7 3.8 3.5 3.1*  CL 99 93* 96 97 96  CO2 26 23 25 28 28   GLUCOSE 75 403* 62* 51* 37*  BUN 50* 50* 49* 45* 41*  CREATININE 1.47* 1.48* 1.53* 1.53* 1.30*  CALCIUM 9.6 9.0 9.6 9.5 9.9  MG  --  2.2 2.3 2.2 2.2   Liver Function Tests:  Recent Labs Lab 08/11/13 1838 08/12/13 1038 08/13/13 0500 08/14/13 0353 08/15/13 0400  AST 41* 29 29 22 23   ALT 34 24 26 19 20   ALKPHOS 67 58 63 62 65  BILITOT 0.7 1.0 1.1 0.8 0.8  PROT 7.2 6.3 6.9 6.9 7.3  ALBUMIN 3.5 3.2* 3.4* 3.2* 3.4*   No results found for this basename: LIPASE, AMYLASE,  in the last 168 hours No results found for this basename: AMMONIA,  in the last 168 hours CBC:  Recent Labs Lab 08/11/13 1838 08/12/13 1038 08/13/13 0500 08/14/13 0353 08/15/13 0400  WBC  12.5* 11.6* 13.6* 12.2* 15.1*  NEUTROABS 9.8* 9.3* 11.0* 8.3* 10.8*  HGB 10.8* 9.8* 10.6* 10.9* 12.3  HCT 30.3* 27.9* 30.5* 30.9* 34.2*  MCV 92.7 92.7 92.7 92.2 91.0  PLT 231 190 226 258 298   Cardiac Enzymes:  Recent Labs Lab 08/11/13 1838 08/12/13 0006 08/12/13 0545 08/12/13 1038  TROPONINI <0.30 <0.30 <0.30 <0.30   BNP (last 3 results)  Recent Labs  03/31/13 0425 05/28/13 1752 08/12/13 1826  PROBNP 2796.0*  16021.0* 5176.0*   CBG:  Recent Labs Lab 08/15/13 0430 08/15/13 0446 08/15/13 0508 08/15/13 0604 08/15/13 0709  GLUCAP 53* 69* 80 115* 243*    Micro Recent Results (from the past 240 hour(s))  CULTURE, BLOOD (ROUTINE X 2)     Status: None   Collection Time    08/11/13 12:06 AM      Result Value Range Status   Specimen Description BLOOD LEFT HAND   Final   Special Requests BOTTLES DRAWN AEROBIC ONLY 6CC   Final   Culture  Setup Time     Final   Value: 08/12/2013 08:04     Performed at [facility]   Culture     Final   Value:        BLOOD CULTURE RECEIVED NO GROWTH TO DATE CULTURE WILL BE HELD FOR 5 DAYS BEFORE ISSUING A FINAL NEGATIVE REPORT     Performed at [facility]   Report Status PENDING   Incomplete  CULTURE, BLOOD (ROUTINE X 2)     Status: None   Collection Time    08/12/13 12:20 AM      Result Value Range Status   Specimen Description BLOOD RIGHT HAND   Final   Special Requests BOTTLES DRAWN AEROBIC ONLY 6CC   Final   Culture  Setup Time     Final   Value: 08/12/2013 08:04     Performed at [facility]   Culture     Final   Value: STAPHYLOCOCCUS SPECIES (COAGULASE NEGATIVE)     Note: THE SIGNIFICANCE OF ISOLATING THIS ORGANISM FROM A SINGLE SET OF BLOOD CULTURES WHEN MULTIPLE SETS ARE DRAWN IS UNCERTAIN. PLEASE NOTIFY THE MICROBIOLOGY DEPARTMENT WITHIN ONE WEEK IF SPECIATION AND SENSITIVITIES ARE REQUIRED.     0350 Note: Gram Stain Report Called to,Read Back By and Verified With: LONNIE COLEMDIES 08/13/2013 [facility]     Performed at [facility]   Report Status 08/14/2013 FINAL   Final  MRSA PCR SCREENING     Status: Abnormal   Collection Time    08/12/13  7:28 AM      Result Value Range Status   MRSA by PCR POSITIVE (*) NEGATIVE Final   Comment:            The GeneXpert MRSA Assay (FDA     approved for NASAL specimens     only), is one component of a     comprehensive MRSA colonization     surveillance program. It is not      intended to diagnose MRSA     infection nor to guide or     monitor treatment for     MRSA infections.     RESULT CALLED TO, READ BACK BY AND VERIFIED WITH:     TRIPP A RN 08/12/13 0929 COSTELLO B     Studies: Dg Chest 2 View  08/14/2013   *RADIOLOGY REPORT*  Clinical Data: Shortness of breath.  CHEST - 2 VIEW  Comparison: 08/11/2013  Findings:  Bilateral lung base opacity with associated small pleural effusions, and more band-like opacity extends across the mid to lower lung zones is essentially stable.  The parenchymal opacity is likely atelectasis.  Infiltrate as a component should be considered likely in the proper clinical setting.  No pulmonary edema.  There is mild enlargement the cardiac silhouette.  No mediastinal or hilar masses.  IMPRESSION: No significant change from the recent prior study.  Persistent lung base opacities most likely a combination of small pleural effusions and atelectasis.  Infiltrate is possible.   Original Report Authenticated By: Amie Portland, M.D.    Scheduled Meds: . apixaban  2.5 mg Oral BID  . atorvastatin  10 mg Oral q1800  . B-complex with vitamin C  1 tablet Oral Daily  . Chlorhexidine Gluconate Cloth  6 each Topical Q0600  . cholecalciferol  1,000 Units Oral Daily  . dextrose  50 mL Intravenous Once  . diltiazem  180 mg Oral Daily  . docusate sodium  100 mg Oral BID  . escitalopram  20 mg Oral Daily  . furosemide  40 mg Intravenous Daily  . insulin aspart  0-9 Units Subcutaneous Q4H  . insulin glargine  10 Units Subcutaneous QHS  . latanoprost  1 drop Both Eyes QHS  . levofloxacin  500 mg Oral Q48H  . metolazone  5 mg Oral Daily  . metoprolol tartrate  25 mg Oral BID  . multivitamin-lutein  1 capsule Oral BID  . mupirocin ointment  1 application Nasal BID  . potassium chloride SA  40 mEq Oral Daily  . sodium chloride  3 mL Intravenous Q12H  . spironolactone  12.5 mg Oral q morning - 10a  . timolol  1 drop Both Eyes Daily  . vancomycin  500  mg Intravenous Once  . vancomycin  750 mg Intravenous Q24H   Continuous Infusions:   Active Problems:   Erosive esophagitis   Shortness of breath   Acute diastolic CHF (congestive heart failure)   Atrial fibrillation with RVR   Diabetes mellitus   HCAP (healthcare-associated pneumonia)   Bacteremia    Time spent: 35 minutes    [facility] A  [facility] Pager [phone] If 7PM-7AM, please contact night-coverage at [email or web address], password [facility] 08/15/2013, 11:34 AM  LOS: 4 days

## 2013-08-15 NOTE — Progress Notes (Signed)
Hypoglycemic Event  CBG: 69  Treatment: 15 GM carbohydrate snack  Symptoms: Shaky  Follow-up CBG: Time:0508 CBG Result:80  Possible Reasons for Event: Unknown  Comments/MD notified:    Bartholomew Boards  Remember to initiate Hypoglycemia Order Set & complete

## 2013-08-15 NOTE — Progress Notes (Signed)
CRITICAL VALUE ALERT  Critical value received:  Glucose 37  Date of notification:  08/15/13  Time of notification:  0527  Critical value read back:yes  Nurse who received alert:  Courtney Heys, RN  MD notified (1st page):  Algis Downs  Time of first page:  0530  MD notified (2nd page):  Time of second page:  Responding MD:  Algis Downs  Time MD responded:  [phone]

## 2013-08-15 NOTE — Progress Notes (Signed)
Hypoglycemic Event  CBG: 53  Treatment: 15 GM carbohydrate snack  Symptoms: Shaky  Follow-up CBG: [id number] CBG Result:69  Possible Reasons for Event: Unknown  Comments/MD notified:    Bartholomew Boards  Remember to initiate Hypoglycemia Order Set & complete

## 2013-08-15 NOTE — Progress Notes (Signed)
Hypoglycemic Event  CBG: 39  Treatment: 15 GM carbohydrate snack  Symptoms: Shaky  Follow-up CBG: Time:0430 CBG Result:53  Possible Reasons for Event: Unknown  Comments/MD notified:    Bartholomew Boards  Remember to initiate Hypoglycemia Order Set & complete

## 2013-08-15 NOTE — Progress Notes (Signed)
Patient with CBG of 37.  AM Lantus held.  Novolog held until CBGs reach 150 then SSI Sensitive restarted.  Please address insulin dosing.  Thanks,  Algis Downs, PA-C [facility] Pager: [phone]

## 2013-08-16 LAB — CBC WITH DIFFERENTIAL/PLATELET
Eosinophils Absolute: 0.8 10*3/uL — ABNORMAL HIGH (ref 0.0–0.7)
Lymphocytes Relative: 11 % — ABNORMAL LOW (ref 12–46)
Lymphs Abs: 1.4 10*3/uL (ref 0.7–4.0)
MCH: 32.2 pg (ref 26.0–34.0)
Neutrophils Relative %: 71 % (ref 43–77)
Platelets: 251 10*3/uL (ref 150–400)
RBC: 3.32 MIL/uL — ABNORMAL LOW (ref 3.87–5.11)
WBC: 12.5 10*3/uL — ABNORMAL HIGH (ref 4.0–10.5)

## 2013-08-16 LAB — GLUCOSE, CAPILLARY: Glucose-Capillary: 336 mg/dL — ABNORMAL HIGH (ref 70–99)

## 2013-08-16 LAB — COMPREHENSIVE METABOLIC PANEL
BUN: 45 mg/dL — ABNORMAL HIGH (ref 6–23)
CO2: 26 mEq/L (ref 19–32)
Calcium: 9.7 mg/dL (ref 8.4–10.5)
Creatinine, Ser: 1.48 mg/dL — ABNORMAL HIGH (ref 0.50–1.10)
GFR calc Af Amer: 35 mL/min — ABNORMAL LOW (ref >90)
GFR calc non Af Amer: 30 mL/min — ABNORMAL LOW (ref >90)
Glucose, Bld: 194 mg/dL — ABNORMAL HIGH (ref 70–99)

## 2013-08-16 MED ORDER — INSULIN GLARGINE 100 UNIT/ML ~~LOC~~ SOLN: 20.0000 [IU] | Freq: Every day | SUBCUTANEOUS | Status: DC

## 2013-08-16 NOTE — Progress Notes (Signed)
Per MD- patient is medically stable for d/c.  Physical Therapy continues to recommend home health PT for patient at the ALF.  Per patient's son- patient is currently receiving "enhanced care" at the facility where they provide daily care of patient.  He is satisfied with patient being able to return to ALF.  CSW completed FL2 and send d/c summary and FL2 to Crystal for review. Due to lateness in the day and inability to get patient's orders confirmed with her MD- she stated that they would not be able to accept patient until tomorrow. She needs to check for medication discrepancies. CSW discussed with patient's son Jonny Ruiz and notified pt's nurse- Junius Roads.  Jacki Cones, Charge Nurse- will contact MD regarding delay. CSW will follow up in the a.m and assist with d/c.  RNCM has arranged for Home Health follow up.  Lorri Frederick. [place]  [phone]

## 2013-08-16 NOTE — Progress Notes (Addendum)
Pt says she lives alone but people check up on her many times during the day including all meals to room if she can't get to dining room, all her meds, help with bathing and a couple of other well checks during the day. Pending discharge to [facility] tomorrow. Up in chair and amb in room today with minimal assist- tolerated well. Pt had an order to discharge today text page to [facility] to make aware that [facility] cannot accept pt today due to some paperwork problem.

## 2013-08-16 NOTE — Discharge Summary (Signed)
Physician Discharge Summary  Morgan Morris ZOX:[id number] DOB: 01/13/1920 DOA: 08/11/2013  PCP: Juline Patch, MD  Admit date: 08/11/2013 Discharge date: 08/16/2013  Time spent: 40 minutes  Recommendations for Outpatient Follow-up:  1. Followup with her regular physician within one week  Discharge Diagnoses:  Active Problems:   Erosive esophagitis   Shortness of breath   Acute diastolic CHF (congestive heart failure)   Atrial fibrillation with RVR   Diabetes mellitus   HCAP (healthcare-associated pneumonia)   Bacteremia   Discharge Condition: Stable  Diet recommendation: Carbohydrate modified diet  Filed Weights   08/14/13 0355 08/15/13 0637 08/16/13 0624  Weight: 61.1 kg (134 lb 11.2 oz) 60.555 kg (133 lb 8 oz) 60.192 kg (132 lb 11.2 oz)    History of present illness:  Morgan Morris is an 79 y.o. female nursing home resident with DNR code status, hx of dementia, recent admission for PNA along with afib RVR on anticoagulation with Eliquis, DM, HTN, brought to the ER as she was having increased confusion and shortness of breath. Evaluation in the ER included a CXR which showed multiple infiltrate unclear if they were PNA or atelectasis. She has a WBC of 12K, with Hb of 10.8 g/DL, and Cr of 1.4. As she was being discharged, she was having more DOE and a little diaphoresis. She had no ischemic EKG changes and negative initial troponin. At this point, she was given Levoquin and VAN, and hospitalist was asked to admit her for HCAP.  Hospital Course:   1. Health care associated pneumonia: Upon admission to the hospital patient started on vancomycin and Levaquin. Patient did very well and the fever as well as leukocytosis resolved. Her breathing improved very well and patient completed 5 days of IV antibiotics in the hospital, no more antibiotics are indicated on discharge.  2. Hypoglycemia: Patient has brittle diabetes mellitus, she is getting 20 units of Lantus insulin in the morning  and 10 in the evening. Patient was getting hypoglycemia towards a.m. and hyperglycemia during daytime. Lantus insulin adjusted to 20 units at night, patient might need further adjustment of nutritional coverage insulin, currently she is doing sliding scale.  3. Coagulase-negative staph bacteremia: 1/2 of blood culture bottles positive for coagulase-negative staph, patient does not look septic anyway she was on vancomycin, this is probably represents contamination of the blood culture. Patient did not get treatment for bacteremia.  4. Atrial fibrillation with rapid ventricular response: Heart rate controlled with metoprolol and Cardizem, patient is on Eliquis for anticoagulation. Prior to discharge rate is controlled.  5. Acute on chronic diastolic CHF: 2-D echocardiogram showed LVEF of around 60%, patient is on metolazone spironolactone furosemide, her medications were restarted and her breathing improved very well.  6. Type 2 diabetes mellitus: Controlled type 2 diabetes mellitus with hemoglobin A1c of 6.8 on 05/29/2013 indicating tight glycemic control. Patient probably has brittle diabetes mellitus as mentioned above patient was getting around 30 units of Lantus and she did have hypoglycemia with CBG of 39 in the morning, in the same day towards dinnertime her blood glucose was 484. Patient needs adjustment of nutritional insulin coverage, to followup with primary care physician.  I spoke with her son who prefers her to get to skilled nursing facility, patient evaluated by PT and they recommended home with home health PT. Patient will be back to her assisted-living facility at [facility] with home health services.  Procedures:  None  Consultations:  None  Discharge Exam: Filed Vitals:   08/16/13 1100  BP: 121/79  Pulse: 78  Temp:   Resp:    General: Alert and awake, oriented x3, not in any acute distress. HEENT: anicteric sclera, pupils reactive to light and accommodation,  EOMI CVS: S1-S2 clear, no murmur rubs or gallops Chest: clear to auscultation bilaterally, no wheezing, rales or rhonchi Abdomen: soft nontender, nondistended, normal bowel sounds, no organomegaly Extremities: no cyanosis, clubbing or edema noted bilaterally Neuro: Cranial nerves II-XII intact, no focal neurological deficits  Discharge Instructions  Discharge Orders   Future Orders Complete By Expires   Diet - low sodium heart healthy  As directed    Increase activity slowly  As directed        Medication List         albuterol 108 (90 BASE) MCG/ACT inhaler  Commonly known as:  PROVENTIL HFA;VENTOLIN HFA  Inhale 2 puffs into the lungs every 6 (six) hours as needed for wheezing.     albuterol (2.5 MG/3ML) 0.083% nebulizer solution  Commonly known as:  PROVENTIL  Take 2.5 mg by nebulization every 6 (six) hours as needed for wheezing.     apixaban 2.5 MG Tabs tablet  Commonly known as:  ELIQUIS  Take 1 tablet (2.5 mg total) by mouth 2 (two) times daily.     b complex vitamins tablet  Take 1 tablet by mouth daily at 12 noon.     cholecalciferol 1000 UNITS tablet  Commonly known as:  VITAMIN D  Take 1,000 Units by mouth daily.     diltiazem 180 MG 24 hr capsule  Commonly known as:  CARDIZEM CD  Take 1 capsule (180 mg total) by mouth daily.     escitalopram 10 MG tablet  Commonly known as:  LEXAPRO  Take 20 mg by mouth daily.     furosemide 20 MG tablet  Commonly known as:  LASIX  Take 40 mg by mouth daily.     insulin glargine 100 UNIT/ML injection  Commonly known as:  LANTUS  Inject 0.2 mLs (20 Units total) into the skin at bedtime.     insulin regular 100 units/mL injection  Commonly known as:  NOVOLIN R,HUMULIN R  - Inject 0.04-0.25 mLs (4-25 Units total) into the skin 3 (three) times daily before meals. Per sliding scale:  - 0 - 120 = 0 units  - 121 - 150 =2 units  - 151 - 200 = 4 units  - 201 - 250 = 6 units  - 251 - 300 = 10 units  - >300 = 12  units     latanoprost 0.005 % ophthalmic solution  Commonly known as:  XALATAN  Place 1 drop into both eyes at bedtime.     metoprolol tartrate 25 MG tablet  Commonly known as:  LOPRESSOR  Take 0.5 tablets (12.5 mg total) by mouth 2 (two) times daily.     OCUVITE PRESERVISION PO  Take 1 tablet by mouth 2 (two) times daily.     potassium chloride SA 20 MEQ tablet  Commonly known as:  K-DUR,KLOR-CON  Take 2 tablets (40 mEq total) by mouth daily.     pravastatin 40 MG tablet  Commonly known as:  PRAVACHOL  Take 40 mg by mouth daily.     QUEtiapine 25 MG tablet  Commonly known as:  SEROQUEL  Take 6.25 mg by mouth at bedtime.     spironolactone 25 MG tablet  Commonly known as:  ALDACTONE  Take 12.5 mg by mouth every morning.     timolol  0.5 % ophthalmic solution  Commonly known as:  TIMOPTIC  Place 1 drop into both eyes daily.       Allergies  Allergen Reactions  . Penicillins   . Sulfa Antibiotics        Follow-up Information   Follow up with PANG,RICHARD, MD. Schedule an appointment as soon as possible for a visit in 3 days.   Specialty:  Internal Medicine   Contact information:   [address] [place] [address] [phone]       Follow up with [facility] EMERGENCY DEPARTMENT. (If symptoms worsen, if you have fever, chest pain, difficulty breathing, or any other concerns)    Specialty:  Emergency Medicine   Contact information:   [address] [id number] [place] [address] [phone]       The results of significant diagnostics from this hospitalization (including imaging, microbiology, ancillary and laboratory) are listed below for reference.    Significant Diagnostic Studies: Dg Chest 2 View  08/14/2013   *RADIOLOGY REPORT*  Clinical Data: Shortness of breath.  CHEST - 2 VIEW  Comparison: 08/11/2013  Findings: Bilateral lung base opacity with associated small pleural effusions, and more band-like  opacity extends across the mid to lower lung zones is essentially stable.  The parenchymal opacity is likely atelectasis.  Infiltrate as a component should be considered likely in the proper clinical setting.  No pulmonary edema.  There is mild enlargement the cardiac silhouette.  No mediastinal or hilar masses.  IMPRESSION: No significant change from the recent prior study.  Persistent lung base opacities most likely a combination of small pleural effusions and atelectasis.  Infiltrate is possible.   Original Report Authenticated By: Amie Portland, M.D.   Dg Chest 2 View  08/11/2013   CLINICAL DATA:  Altered mental status. Diabetes.  EXAM: CHEST  2 VIEW  COMPARISON:  05/30/2013  FINDINGS: Mild cardiomegaly noted with primarily linear airspace opacities in the lingula and left lower lobe, and indistinct new airspace opacity in the right middle lobe. Faint interstitial accentuation. Atherosclerotic aortic arch. Dextro convex lumbar scoliosis. Splenic artery calcification.  IMPRESSION: 1. Increased airspace opacity in the right middle lobe with similar bandlike opacities in the lingula and left lower lobe. Although some of these have morphology that favors atelectasis, a component of pneumonia is not excluded. Followup to clearance is recommended. 2. Cardiomegaly. 3. Atherosclerosis.   Electronically Signed   By: Herbie Baltimore   On: 08/11/2013 19:06    Microbiology: Recent Results (from the past 240 hour(s))  CULTURE, BLOOD (ROUTINE X 2)     Status: None   Collection Time    08/11/13 12:06 AM      Result Value Range Status   Specimen Description BLOOD LEFT HAND   Final   Special Requests BOTTLES DRAWN AEROBIC ONLY [facility]   Final   Culture  Setup Time     Final   Value: 08/12/2013 08:04     Performed at [facility]   Culture     Final   Value:        BLOOD CULTURE RECEIVED NO GROWTH TO DATE CULTURE WILL BE HELD FOR 5 DAYS BEFORE ISSUING A FINAL NEGATIVE REPORT     Performed at [facility]   Report Status PENDING   Incomplete  CULTURE, BLOOD (ROUTINE X 2)     Status: None   Collection Time    08/12/13 12:20 AM      Result Value  Range Status   Specimen Description BLOOD RIGHT HAND   Final   Special Requests BOTTLES DRAWN AEROBIC ONLY [facility]   Final   Culture  Setup Time     Final   Value: 08/12/2013 08:04     Performed at [facility]   Culture     Final   Value: STAPHYLOCOCCUS SPECIES (COAGULASE NEGATIVE)     Note: THE SIGNIFICANCE OF ISOLATING THIS ORGANISM FROM A SINGLE SET OF BLOOD CULTURES WHEN MULTIPLE SETS ARE DRAWN IS UNCERTAIN. PLEASE NOTIFY THE MICROBIOLOGY DEPARTMENT WITHIN ONE WEEK IF SPECIATION AND SENSITIVITIES ARE REQUIRED.     0350 Note: Gram Stain Report Called to,Read Back By and Verified With: LONNIE COLEMDIES 08/13/2013 [facility]     Performed at [facility]   Report Status 08/14/2013 FINAL   Final  MRSA PCR SCREENING     Status: Abnormal   Collection Time    08/12/13  7:28 AM      Result Value Range Status   MRSA by PCR POSITIVE (*) NEGATIVE Final   Comment:            The GeneXpert MRSA Assay (FDA     approved for NASAL specimens     only), is one component of a     comprehensive MRSA colonization     surveillance program. It is not     intended to diagnose MRSA     infection nor to guide or     monitor treatment for     MRSA infections.     RESULT CALLED TO, READ BACK BY AND VERIFIED WITH:     TRIPP A RN 08/12/13 0929 COSTELLO B     Labs: Basic Metabolic Panel:  Recent Labs Lab 08/12/13 1038 08/13/13 0500 08/14/13 0353 08/15/13 0400 08/16/13 0505  NA 130* 135 137 137 133*  K 4.7 3.8 3.5 3.1* 4.4  CL 93* 96 97 96 94*  CO2 23 25 28 28 26   GLUCOSE 403* 62* 51* 37* 194*  BUN 50* 49* 45* 41* 45*  CREATININE 1.48* 1.53* 1.53* 1.30* 1.48*  CALCIUM 9.0 9.6 9.5 9.9 9.7  MG 2.2 2.3 2.2 2.2 2.3   Liver Function Tests:  Recent Labs Lab 08/12/13 1038 08/13/13 0500 08/14/13 0353 08/15/13 0400 08/16/13 0505  AST  29 29 22 23 15   ALT 24 26 19 20 14   ALKPHOS 58 63 62 65 57  BILITOT 1.0 1.1 0.8 0.8 0.6  PROT 6.3 6.9 6.9 7.3 6.6  ALBUMIN 3.2* 3.4* 3.2* 3.4* 3.0*   No results found for this basename: LIPASE, AMYLASE,  in the last 168 hours No results found for this basename: AMMONIA,  in the last 168 hours CBC:  Recent Labs Lab 08/12/13 1038 08/13/13 0500 08/14/13 0353 08/15/13 0400 08/16/13 0505  WBC 11.6* 13.6* 12.2* 15.1* 12.5*  NEUTROABS 9.3* 11.0* 8.3* 10.8* 8.8*  HGB 9.8* 10.6* 10.9* 12.3 10.7*  HCT 27.9* 30.5* 30.9* 34.2* 30.6*  MCV 92.7 92.7 92.2 91.0 92.2  PLT 190 226 258 298 251   Cardiac Enzymes:  Recent Labs Lab 08/11/13 1838 08/12/13 0006 08/12/13 0545 08/12/13 1038  TROPONINI <0.30 <0.30 <0.30 <0.30   BNP: BNP (last 3 results)  Recent Labs  03/31/13 0425 05/28/13 1752 08/12/13 1826  PROBNP 2796.0* 16021.0* 5176.0*   CBG:  Recent Labs Lab 08/15/13 2013 08/15/13 2353 08/16/13 0331 08/16/13 0810 08/16/13 1134  GLUCAP 173* 108* 235* 103* 409*       Signed:  Aileana Hodder A  [facility] 08/16/2013, 1:47 PM

## 2013-08-16 NOTE — Progress Notes (Signed)
Patient rested comfortable in bed in no acute distress throughout shift.  Patient remains on Q4 hour CBG's and blood sugars ranged from 108-235.  Sliding scale insulin given per MAR.  RN will continue to monitor. Louretta Parma, RN

## 2013-08-16 NOTE — Care Management Note (Addendum)
  Page 2 of 2   08/16/2013     3:08:51 PM   CARE MANAGEMENT NOTE 08/16/2013  Patient:  JONIYAH, MALLINGER   Account Number:  [id number]  Date Initiated:  08/16/2013  Documentation initiated by:  [facility]  Subjective/Objective Assessment:   79 y.o. female nursing home resident with DNR code status, hx of dementia, recent admission for PNA along with afib RVR on  Eliquis, DM, HTN, brought to the ER as she was having increased confusion and SOB//FROM [place]     Action/Plan:   diuresis with IV lasix//RETURN TO [place]   Anticipated DC Date:  08/16/2013   Anticipated DC Plan:  ASSISTED LIVING / REST HOME  In-house referral  Clinical Social Worker      DC Associate Professor  CM consult      [facility] Choice  Resumption Of Svcs/PTA Provider   Choice offered to / List presented to:          [facility] arranged  HH-1 RN  [employer] PT      [facility] agency  [facility]   Status of service:   Medicare Important Message given?   (If response is "NO", the following Medicare IM given date fields will be blank) Date Medicare IM given:   Date Additional Medicare IM given:    Discharge Disposition:    Per UR Regulation:    If discussed at Long Length of Stay Meetings, dates discussed:    Comments:  08/16/13 1430  Oletta Cohn, RN, BSN, [facility] [phone] Pt returning to [facility] requiring HHRN/PT. [place] utilizes [facility] services via [facility].  [facility]bie [facility] [place] notified to resume services.

## 2013-08-16 NOTE — ED Provider Notes (Signed)
I saw and evaluated the patient, reviewed the resident's note and I agree with the findings and plan.  Please see my separate note regarding my evaluation of the patient.  Clinical Impression:  Altered Mental Status, HCAP   Vida Roller, MD 08/16/13 [phone]

## 2013-08-16 NOTE — Progress Notes (Signed)
Physical Therapy Treatment Patient Details Name: Morgan Morris MRN: [id number] DOB: Mar 17, 1920 Today's Date: 08/16/2013 Time: 4696-2952 PT Time Calculation (min): 13 min  PT Assessment / Plan / Recommendation  History of Present Illness 79 y.o. female admitted to [facility] on 08/11/13 with increased confusion and SOB.  Dx with acute on chronic CHF and CAP.     PT Comments   Pt pleasant & willing to participate in therapy.  Progressing with PT goals & mobility.  Increased ambulation distance & required decreased assistance for all mobility today.  Cont with current POC & recommendation of HHPT at ALF.     Follow Up Recommendations  Home health PT;Other (at ALF)     Does the patient have the potential to tolerate intense rehabilitation     Barriers to Discharge        Equipment Recommendations  None recommended by PT    Recommendations for Other Services    Frequency Min 3X/week   Progress towards PT Goals Progress towards PT goals: Progressing toward goals  Plan Current plan remains appropriate    Precautions / Restrictions Precautions Precautions: Fall Restrictions Weight Bearing Restrictions: No   Pertinent Vitals/Pain No pain reported.     Mobility  Bed Mobility Bed Mobility: Supine to Sit;Sitting - Scoot to Edge of Bed Supine to Sit: 5: Supervision;HOB flat Sitting - Scoot to Edge of Bed: 5: Supervision Transfers Transfers: Sit to Stand;Stand to Sit Sit to Stand: 4: Min guard;With upper extremity assist;From bed Stand to Sit: 4: Min guard;With upper extremity assist;With armrests;To chair/3-in-1 Details for Transfer Assistance: cues for safest hand placement due to pt wanting to pull self to standing with hands on RW & not reach back for armrests with stand>sit.  Ambulation/Gait Ambulation/Gait Assistance: 4: Min guard Ambulation Distance (Feet): 120 Feet Assistive device: Rolling walker Ambulation/Gait Assistance Details: Guarding for safety.  Pt steady.   Gait  Pattern: Step-through pattern;Decreased stride length Stairs: No Wheelchair Mobility Wheelchair Mobility: No    Exercises Total Joint Exercises Ankle Circles/Pumps: AROM;Both;10 reps Hip ABduction/ADduction: AROM;Strengthening;Both;10 reps Straight Leg Raises: AROM;Strengthening;Both;10 reps Long Arc Quad: AROM;Strengthening;Both;10 reps General Exercises - Lower Extremity Hip Flexion/Marching: AROM;Strengthening;Both;10 reps     PT Goals (current goals can now be found in the care plan section) Acute Rehab PT Goals PT Goal Formulation: With patient Time For Goal Achievement: 08/27/13 Potential to Achieve Goals: Good  Visit Information  Last PT Received On: 08/16/13 Assistance Needed: +1 History of Present Illness: 79 y.o. female admitted to [facility] on 08/11/13 with increased confusion and SOB.  Dx with acute on chronic CHF and CAP.      Subjective Data      Cognition  Cognition Arousal/Alertness: Awake/alert Behavior During Therapy: WFL for tasks assessed/performed Overall Cognitive Status: History of cognitive impairments - at baseline Memory: Decreased short-term memory    Balance     End of Session PT - End of Session Activity Tolerance: Patient tolerated treatment well Patient left: in chair;with call bell/phone within reach Nurse Communication: Mobility status   GP     Lara Mulch 08/16/2013, 8:58 AM  Verdell Face, PTA [phone] 08/16/2013

## 2013-08-16 NOTE — Progress Notes (Signed)
Inpatient Diabetes Program Recommendations  AACE/ADA: New Consensus Statement on Inpatient Glycemic Control (2013)  Target Ranges:  Prepandial:   less than 140 mg/dL      Peak postprandial:   less than 180 mg/dL (1-2 hours)      Critically ill patients:  140 - 180 mg/dL   Reason for Assessment:  Severe swings in glycemic control  Results for LETRICE, POLLOK (MRN [id number]) as of 08/16/2013 13:29  Ref. Range 08/14/2013 00:34 08/14/2013 03:33 08/14/2013 05:07 08/14/2013 07:59 08/14/2013 11:51 08/14/2013 16:16 08/14/2013 20:12 08/15/2013 00:06 08/15/2013 04:16 08/15/2013 04:30 08/15/2013 04:46 08/15/2013 05:08 08/15/2013 06:04 08/15/2013 07:09 08/15/2013 09:06 08/15/2013 11:14 08/15/2013 12:39 08/15/2013 16:28 08/15/2013 20:13 08/15/2013 23:53 08/16/2013 03:31 08/16/2013 08:10 08/16/2013 11:34  Glucose-Capillary Latest Range: 70-99 mg/dL 956 (H) 69 (L) 213 (H) 92 244 (H) 444 (H) 484 (H) 148 (H) 39 (LL) 53 (L) 69 (L) 80 115 (H) 243 (H) 474 (H) 509 (H) 476 (H) 181 (H) 173 (H) 108 (H) 235 (H) 103 (H) 409 (H)   Note:  Intake is variable.  Receiving correction-- sensitive scale every 4 hours.  Suggest ordering 3 units Novolog meal coverage tid with meals to be given if eats at least 50% and held if eats less or CBG < 80 mg/dl.  Also suggest changing correction scale to tid with meals only.  Please change CBG's to ac & HS and 2 to 3 am.  Thank you.  Simora Dingee S. Elsie Lincoln, RN, CNS, CDE Inpatient Diabetes Program, team pager [phone]

## 2013-08-17 DIAGNOSIS — D649 Anemia, unspecified: Secondary | ICD-10-CM

## 2013-08-17 LAB — COMPREHENSIVE METABOLIC PANEL
ALT: 11 U/L (ref 0–35)
AST: 14 U/L (ref 0–37)
Alkaline Phosphatase: 55 U/L (ref 39–117)
CO2: 32 mEq/L (ref 19–32)
Calcium: 9.5 mg/dL (ref 8.4–10.5)
Chloride: 95 mEq/L — ABNORMAL LOW (ref 96–112)
GFR calc Af Amer: 32 mL/min — ABNORMAL LOW (ref >90)
GFR calc non Af Amer: 27 mL/min — ABNORMAL LOW (ref >90)
Glucose, Bld: 140 mg/dL — ABNORMAL HIGH (ref 70–99)
Sodium: 136 mEq/L (ref 135–145)
Total Bilirubin: 0.6 mg/dL (ref 0.3–1.2)

## 2013-08-17 LAB — CBC WITH DIFFERENTIAL/PLATELET
Basophils Absolute: 0.1 10*3/uL (ref 0.0–0.1)
Basophils Relative: 1 % (ref 0–1)
Eosinophils Absolute: 0.9 10*3/uL — ABNORMAL HIGH (ref 0.0–0.7)
Eosinophils Relative: 8 % — ABNORMAL HIGH (ref 0–5)
Lymphs Abs: 1.5 10*3/uL (ref 0.7–4.0)
MCH: 32.4 pg (ref 26.0–34.0)
MCHC: 35.3 g/dL (ref 30.0–36.0)
MCV: 92 fL (ref 78.0–100.0)
Neutrophils Relative %: 67 % (ref 43–77)
Platelets: 233 10*3/uL (ref 150–400)
RDW: 15.3 % (ref 11.5–15.5)

## 2013-08-17 LAB — GLUCOSE, CAPILLARY
Glucose-Capillary: 201 mg/dL — ABNORMAL HIGH (ref 70–99)
Glucose-Capillary: 282 mg/dL — ABNORMAL HIGH (ref 70–99)

## 2013-08-17 MED ORDER — BOOST DIABETIC PO LIQD: 1.0000 | Freq: Three times a day (TID) | ORAL | Status: DC

## 2013-08-17 MED ORDER — METOPROLOL TARTRATE 25 MG PO TABS: 25.0000 mg | ORAL_TABLET | Freq: Two times a day (BID) | ORAL | Status: AC

## 2013-08-17 NOTE — Discharge Summary (Signed)
Physician Discharge Summary  Morgan Morris GNF:[id number] DOB: Aug 20, 1920 DOA: 08/11/2013  PCP: Juline Patch, MD  Admit date: 08/11/2013 Discharge date: 08/17/2013  Time spent: 40 minutes  Recommendations for Outpatient Follow-up:  1. Followup with her regular physician within one week  Discharge Diagnoses:  Active Problems:   Erosive esophagitis   Shortness of breath   Acute diastolic CHF (congestive heart failure)   Atrial fibrillation with RVR   Diabetes mellitus   HCAP (healthcare-associated pneumonia)   Bacteremia   Discharge Condition: Stable  Diet recommendation: Carbohydrate modified diet  Filed Weights   08/15/13 0637 08/16/13 0624 08/17/13 0731  Weight: 60.555 kg (133 lb 8 oz) 60.192 kg (132 lb 11.2 oz) 59.512 kg (131 lb 3.2 oz)    History of present illness:  Morgan Morris is an 79 y.o. female nursing home resident with DNR code status, hx of dementia, recent admission for PNA along with afib RVR on anticoagulation with Eliquis, DM, HTN, brought to the ER as she was having increased confusion and shortness of breath. Evaluation in the ER included a CXR which showed multiple infiltrate unclear if they were PNA or atelectasis. She has a WBC of 12K, with Hb of 10.8 g/DL, and Cr of 1.4. As she was being discharged, she was having more DOE and a little diaphoresis. She had no ischemic EKG changes and negative initial troponin. At this point, she was given Levoquin and VAN, and hospitalist was asked to admit her for HCAP.  Hospital Course:   1. Health care associated pneumonia: Upon admission to the hospital patient started on vancomycin and Levaquin. Patient did very well and the fever as well as leukocytosis resolved. Her breathing improved very well and patient completed 5 days of IV antibiotics in the hospital, no more antibiotics are indicated on discharge.  2. Hypoglycemia: Patient has brittle diabetes mellitus, she is getting 20 units of Lantus insulin in the morning  and 10 in the evening. Patient was getting hypoglycemia towards a.m. and hyperglycemia during daytime. Lantus insulin adjusted to 20 units at night, patient might need further adjustment of nutritional coverage insulin, currently she is doing sliding scale.  3. Coagulase-negative staph bacteremia: 1/2 of blood culture bottles positive for coagulase-negative staph, patient does not look septic anyway she was on vancomycin, this is probably represents contamination of the blood culture. Patient did not get treatment for bacteremia.  4. Atrial fibrillation with rapid ventricular response: Heart rate controlled with metoprolol and Cardizem, patient is on Eliquis for anticoagulation. Prior to discharge rate is controlled.  5. Acute on chronic diastolic CHF: 2-D echocardiogram showed LVEF of around 60%, patient is on metolazone spironolactone furosemide, her medications were restarted and her breathing improved very well.  6. Type 2 diabetes mellitus: Controlled type 2 diabetes mellitus with hemoglobin A1c of 6.8 on 05/29/2013 indicating tight glycemic control. Patient probably has brittle diabetes mellitus as mentioned above patient was getting around 30 units of Lantus and she did have hypoglycemia with CBG of 39 in the morning, in the same day towards dinnertime her blood glucose was 484. Patient needs adjustment of nutritional insulin coverage, to followup with primary care physician.  I spoke with her son who prefers her to get to skilled nursing facility, patient evaluated by PT and they recommended home with home health PT. Patient will be back to her assisted-living facility at [facility] with home health services.  Procedures:  None  Consultations:  None  Discharge Exam: Filed Vitals:   08/17/13 8469  BP: 109/61  Pulse: 75  Temp: 97.6 F (36.4 C)  Resp: 18   General: Alert and awake, oriented x3, not in any acute distress. HEENT: anicteric sclera, pupils reactive to light and  accommodation, EOMI CVS: S1-S2 clear, no murmur rubs or gallops Chest: clear to auscultation bilaterally, no wheezing, rales or rhonchi Abdomen: soft nontender, nondistended, normal bowel sounds, no organomegaly Extremities: no cyanosis, clubbing or edema noted bilaterally Neuro: Cranial nerves II-XII intact, no focal neurological deficits  Discharge Instructions      Discharge Orders   Future Orders Complete By Expires   Diet - low sodium heart healthy  As directed    Diet Carb Modified  As directed    Increase activity slowly  As directed        Medication List         albuterol 108 (90 BASE) MCG/ACT inhaler  Commonly known as:  PROVENTIL HFA;VENTOLIN HFA  Inhale 2 puffs into the lungs every 6 (six) hours as needed for wheezing.     albuterol (2.5 MG/3ML) 0.083% nebulizer solution  Commonly known as:  PROVENTIL  Take 2.5 mg by nebulization every 6 (six) hours as needed for wheezing.     apixaban 2.5 MG Tabs tablet  Commonly known as:  ELIQUIS  Take 1 tablet (2.5 mg total) by mouth 2 (two) times daily.     b complex vitamins tablet  Take 1 tablet by mouth daily at 12 noon.     BOOST DIABETIC Liqd  Take 1 Bottle by mouth 3 (three) times daily after meals.     cholecalciferol 1000 UNITS tablet  Commonly known as:  VITAMIN D  Take 1,000 Units by mouth daily.     diltiazem 180 MG 24 hr capsule  Commonly known as:  CARDIZEM CD  Take 1 capsule (180 mg total) by mouth daily.     escitalopram 10 MG tablet  Commonly known as:  LEXAPRO  Take 20 mg by mouth daily.     furosemide 20 MG tablet  Commonly known as:  LASIX  Take 40 mg by mouth daily.     insulin glargine 100 UNIT/ML injection  Commonly known as:  LANTUS  Inject 0.2 mLs (20 Units total) into the skin at bedtime.     insulin regular 100 units/mL injection  Commonly known as:  NOVOLIN R,HUMULIN R  - Inject 0.04-0.25 mLs (4-25 Units total) into the skin 3 (three) times daily before meals. Per sliding  scale:  - 0 - 120 = 0 units  - 121 - 150 =2 units  - 151 - 200 = 4 units  - 201 - 250 = 6 units  - 251 - 300 = 10 units  - >300 = 12 units     latanoprost 0.005 % ophthalmic solution  Commonly known as:  XALATAN  Place 1 drop into both eyes at bedtime.     metoprolol tartrate 25 MG tablet  Commonly known as:  LOPRESSOR  Take 1 tablet (25 mg total) by mouth 2 (two) times daily.     OCUVITE PRESERVISION PO  Take 1 tablet by mouth 2 (two) times daily.     potassium chloride SA 20 MEQ tablet  Commonly known as:  K-DUR,KLOR-CON  Take 2 tablets (40 mEq total) by mouth daily.     pravastatin 40 MG tablet  Commonly known as:  PRAVACHOL  Take 40 mg by mouth daily.     QUEtiapine 25 MG tablet  Commonly known as:  SEROQUEL  Take 6.25 mg by mouth at bedtime.     spironolactone 25 MG tablet  Commonly known as:  ALDACTONE  Take 12.5 mg by mouth every morning.     timolol 0.5 % ophthalmic solution  Commonly known as:  TIMOPTIC  Place 1 drop into both eyes daily.       Allergies  Allergen Reactions  . Penicillins   . Sulfa Antibiotics    Follow-up Information   Follow up with PANG,RICHARD, MD. Schedule an appointment as soon as possible for a visit in 3 days.   Specialty:  Internal Medicine   Contact information:   [address] [place] [address] [phone]       Follow up with [facility] EMERGENCY DEPARTMENT. (If symptoms worsen, if you have fever, chest pain, difficulty breathing, or any other concerns)    Specialty:  Emergency Medicine   Contact information:   [address] [id number] [place] [address] [phone]       The results of significant diagnostics from this hospitalization (including imaging, microbiology, ancillary and laboratory) are listed below for reference.    Significant Diagnostic Studies: Dg Chest 2 View  08/14/2013   *RADIOLOGY REPORT*  Clinical Data: Shortness of breath.  CHEST  - 2 VIEW  Comparison: 08/11/2013  Findings: Bilateral lung base opacity with associated small pleural effusions, and more band-like opacity extends across the mid to lower lung zones is essentially stable.  The parenchymal opacity is likely atelectasis.  Infiltrate as a component should be considered likely in the proper clinical setting.  No pulmonary edema.  There is mild enlargement the cardiac silhouette.  No mediastinal or hilar masses.  IMPRESSION: No significant change from the recent prior study.  Persistent lung base opacities most likely a combination of small pleural effusions and atelectasis.  Infiltrate is possible.   Original Report Authenticated By: Amie Portland, M.D.   Dg Chest 2 View  08/11/2013   CLINICAL DATA:  Altered mental status. Diabetes.  EXAM: CHEST  2 VIEW  COMPARISON:  05/30/2013  FINDINGS: Mild cardiomegaly noted with primarily linear airspace opacities in the lingula and left lower lobe, and indistinct new airspace opacity in the right middle lobe. Faint interstitial accentuation. Atherosclerotic aortic arch. Dextro convex lumbar scoliosis. Splenic artery calcification.  IMPRESSION: 1. Increased airspace opacity in the right middle lobe with similar bandlike opacities in the lingula and left lower lobe. Although some of these have morphology that favors atelectasis, a component of pneumonia is not excluded. Followup to clearance is recommended. 2. Cardiomegaly. 3. Atherosclerosis.   Electronically Signed   By: Herbie Baltimore   On: 08/11/2013 19:06    Microbiology: Recent Results (from the past 240 hour(s))  CULTURE, BLOOD (ROUTINE X 2)     Status: None   Collection Time    08/11/13 12:06 AM      Result Value Range Status   Specimen Description BLOOD LEFT HAND   Final   Special Requests BOTTLES DRAWN AEROBIC ONLY [facility]   Final   Culture  Setup Time     Final   Value: 08/12/2013 08:04     Performed at [facility]   Culture     Final   Value:        BLOOD CULTURE  RECEIVED NO GROWTH TO DATE CULTURE WILL BE HELD FOR 5 DAYS BEFORE ISSUING A FINAL NEGATIVE REPORT     Performed at [facility]   Report Status PENDING  Incomplete  CULTURE, BLOOD (ROUTINE X 2)     Status: None   Collection Time    08/12/13 12:20 AM      Result Value Range Status   Specimen Description BLOOD RIGHT HAND   Final   Special Requests BOTTLES DRAWN AEROBIC ONLY [facility]   Final   Culture  Setup Time     Final   Value: 08/12/2013 08:04     Performed at [facility]   Culture     Final   Value: STAPHYLOCOCCUS SPECIES (COAGULASE NEGATIVE)     Note: THE SIGNIFICANCE OF ISOLATING THIS ORGANISM FROM A SINGLE SET OF BLOOD CULTURES WHEN MULTIPLE SETS ARE DRAWN IS UNCERTAIN. PLEASE NOTIFY THE MICROBIOLOGY DEPARTMENT WITHIN ONE WEEK IF SPECIATION AND SENSITIVITIES ARE REQUIRED.     0350 Note: Gram Stain Report Called to,Read Back By and Verified With: LONNIE COLEMDIES 08/13/2013 [facility]     Performed at [facility]   Report Status 08/14/2013 FINAL   Final  MRSA PCR SCREENING     Status: Abnormal   Collection Time    08/12/13  7:28 AM      Result Value Range Status   MRSA by PCR POSITIVE (*) NEGATIVE Final   Comment:            The GeneXpert MRSA Assay (FDA     approved for NASAL specimens     only), is one component of a     comprehensive MRSA colonization     surveillance program. It is not     intended to diagnose MRSA     infection nor to guide or     monitor treatment for     MRSA infections.     RESULT CALLED TO, READ BACK BY AND VERIFIED WITH:     TRIPP A RN 08/12/13 0929 COSTELLO B     Labs: Basic Metabolic Panel:  Recent Labs Lab 08/13/13 0500 08/14/13 0353 08/15/13 0400 08/16/13 0505 08/17/13 0610  NA 135 137 137 133* 136  K 3.8 3.5 3.1* 4.4 4.1  CL 96 97 96 94* 95*  CO2 25 28 28 26  32  GLUCOSE 62* 51* 37* 194* 140*  BUN 49* 45* 41* 45* 47*  CREATININE 1.53* 1.53* 1.30* 1.48* 1.60*  CALCIUM 9.6 9.5 9.9 9.7 9.5  MG 2.3 2.2 2.2 2.3 2.3    Liver Function Tests:  Recent Labs Lab 08/13/13 0500 08/14/13 0353 08/15/13 0400 08/16/13 0505 08/17/13 0610  AST 29 22 23 15 14   ALT 26 19 20 14 11   ALKPHOS 63 62 65 57 55  BILITOT 1.1 0.8 0.8 0.6 0.6  PROT 6.9 6.9 7.3 6.6 6.6  ALBUMIN 3.4* 3.2* 3.4* 3.0* 3.0*   No results found for this basename: LIPASE, AMYLASE,  in the last 168 hours No results found for this basename: AMMONIA,  in the last 168 hours CBC:  Recent Labs Lab 08/13/13 0500 08/14/13 0353 08/15/13 0400 08/16/13 0505 08/17/13 0610  WBC 13.6* 12.2* 15.1* 12.5* 11.4*  NEUTROABS 11.0* 8.3* 10.8* 8.8* 7.6  HGB 10.6* 10.9* 12.3 10.7* 11.0*  HCT 30.5* 30.9* 34.2* 30.6* 31.2*  MCV 92.7 92.2 91.0 92.2 92.0  PLT 226 258 298 251 233   Cardiac Enzymes:  Recent Labs Lab 08/11/13 1838 08/12/13 0006 08/12/13 0545 08/12/13 1038  TROPONINI <0.30 <0.30 <0.30 <0.30   BNP: BNP (last 3 results)  Recent Labs  03/31/13 0425 05/28/13 1752 08/12/13 1826  PROBNP 2796.0* 16021.0* 5176.0*   CBG:  Recent Labs  Lab 08/16/13 1552 08/16/13 2010 08/17/13 0008 08/17/13 0359 08/17/13 0734  GLUCAP 336* 201* 83 232* 86       Signed:  Cachet Mccutchen A  [facility] 08/17/2013, 10:45 AM

## 2013-08-17 NOTE — Progress Notes (Signed)
Cosigned for [employer] assessment, I/O, med administrations, care plan/education etc.  Lorretta Harp RN

## 2013-08-17 NOTE — Progress Notes (Signed)
Physical Therapy Treatment Patient Details Name: Morgan Morris MRN: [id number] DOB: [birth year] Today's Date: 08/17/2013 Time: 6213-0865 PT Time Calculation (min): 18 min  PT Assessment / Plan / Recommendation  History of Present Illness 79 y.o. female admitted to [facility] on 08/11/13 with increased confusion and SOB.  Dx with acute on chronic CHF and CAP.     PT Comments   Pt moving fairly well but cont's to be limited by generalized weakness & decreased activity tolerance.    Follow Up Recommendations  Home health PT;Other (comment)     Does the patient have the potential to tolerate intense rehabilitation     Barriers to Discharge        Equipment Recommendations  None recommended by PT    Recommendations for Other Services    Frequency Min 3X/week   Progress towards PT Goals Progress towards PT goals: Progressing toward goals  Plan Current plan remains appropriate    Precautions / Restrictions Precautions Precautions: Fall Restrictions Weight Bearing Restrictions: No   Pertinent Vitals/Pain No pain reported.     Mobility  Bed Mobility Bed Mobility: Not assessed Transfers Transfers: Sit to Stand;Stand to Sit Sit to Stand: 5: Supervision;With upper extremity assist;With armrests;From chair/3-in-1 Stand to Sit: 5: Supervision;With upper extremity assist;With armrests;To chair/3-in-1 Ambulation/Gait Ambulation/Gait Assistance: 4: Min guard Ambulation Distance (Feet): 150 Feet Assistive device: Rolling walker Ambulation/Gait Assistance Details: Cues to stay closer to RW Gait Pattern: Step-through pattern;Decreased stride length Stairs: No Wheelchair Mobility Wheelchair Mobility: No      PT Goals (current goals can now be found in the care plan section) Acute Rehab PT Goals PT Goal Formulation: With patient Time For Goal Achievement: 08/27/13 Potential to Achieve Goals: Good  Visit Information  Last PT Received On: 08/17/13 Assistance Needed: +1 History of  Present Illness: 78 y.o. female admitted to [facility] on 08/11/13 with increased confusion and SOB.  Dx with acute on chronic CHF and CAP.      Subjective Data      Cognition  Cognition Arousal/Alertness: Awake/alert Behavior During Therapy: WFL for tasks assessed/performed Overall Cognitive Status: History of cognitive impairments - at baseline    Balance  Balance Balance Assessed: Yes Static Standing Balance Static Standing - Balance Support: During functional activity Static Standing - Level of Assistance: 5: Stand by assistance Static Standing - Comment/# of Minutes: 5 mins while getting dressed  End of Session PT - End of Session Equipment Utilized During Treatment: Gait belt Activity Tolerance: Patient tolerated treatment well Patient left: in chair;with call bell/phone within reach;with family/visitor present Nurse Communication: Mobility status   GP     Lara Mulch 08/17/2013, 12:42 PM   Verdell Face, PTA [phone] 08/17/2013

## 2013-08-17 NOTE — Progress Notes (Signed)
Pt alert and oriented x4 this shift. Pt with no complaints. Pt resting in room this AM. Scheduled SSI given and CBG checked Q4 Baron Hamper, RN

## 2013-08-17 NOTE — Progress Notes (Signed)
Everything finalized for patient to be discharged to [facility]. Paperwork ready and in order. Patient ready for discharge. IV was D/C'D. Patient stable. Son is transporting patient to the facility. Paperwork sent over with the patient and her son. Stanton Kidney R

## 2013-08-18 LAB — CULTURE, BLOOD (ROUTINE X 2)

## 2013-08-18 NOTE — Progress Notes (Signed)
CSW received call from Crystal at [facility]- they will accept patient back to facility today. OK per patient and son Jonny Ruiz who will transport her via car.  Patient is medically stable per MD for d/c. CSW notified facility and patient's nurse and d/c was finalized.  No further CSW needs identified. CSW signing off.  Lorri Frederick. [place]  [phone]

## 2013-08-18 NOTE — Clinical Social Work Psychosocial (Addendum)
    Clinical Social Work Department BRIEF PSYCHOSOCIAL ASSESSMENT 08/18/2013  Patient:  Morgan Morgan Morris, Morgan Morris     Account Number:  [id number]     Admit date:  08/11/2013  Clinical Social Worker:  Tiburcio Pea  Date/Time:  08/16/2013 03:30 PM  Referred by:  Physician  Date Referred:  08/16/2013 Referred for  Other - See comment   Other Referral:   From ALF   Interview type:  Other - See comment Other interview type:   Patient and son    PSYCHOSOCIAL DATA Living Status:  FACILITY Admitted from facility:  [facility] Level of care:  Assisted Living Primary support name:  Morgan Morgan Morris Morgan Morris Primary support relationship to patient:  CHILD, ADULT Degree of support available:   strong support  *Other son is Morgan Morgan Morris Morgan Morris    CURRENT CONCERNS Current Concerns  Post-Acute Placement   Other Concerns:   SNF vs return to ALF    SOCIAL WORK ASSESSMENT / PLAN 79 Year old female resident of [place] where she receives "enhanced" care at facility per her son Morgan Morgan Morris Morgan Morris. Family stated that should patient require SNF placement they would like to request [facility]. Per Physical Therapy eval- they recommended return to ALF with home health physical therapy.  Son states they are ok with this. Fl2 placed on chart for MD's signature.  Plan d/c back to ALF.   Assessment/plan status:  Psychosocial Support/Ongoing Assessment of Needs Other assessment/ plan:   Information/referral to community resources:   None at this time.    PATIENT'S/FAMILY'S RESPONSE TO PLAN OF CARE: Patent and both sons are agreeable to return to  ALF when medically stable and feel that she receives adequate care at facility. They were appreciative of CSW's visit and assistance.  CSW notiied Engineer, manufacturing at [facility] who will review patient's d/c information and call back regarding acceptance.   Per MD- patient is medically stable today for d/c; however, ALF is unable to accept back until tomorrow due  to need for facility to have facility MD to confirm orders. Plan d/c tomorrow. CSW discussed with patient and sons- Morgan Morgan Morris Morgan Morris and Morgan Morgan Morris Morgan Morris.  Morgan Morris will plan to transport patient back to facility via car tomorrow and requests early d/c.  CSW will facilitate.  Morgan Morgan Morris Morgan Morris. Morgan Morgan Morris Morgan Morris, LCSWA [phone]

## 2013-08-24 DIAGNOSIS — Z8659 Personal history of other mental and behavioral disorders: Secondary | ICD-10-CM

## 2013-09-02 ENCOUNTER — Emergency Department (HOSPITAL_COMMUNITY)
Admission: EM | Admit: 2013-09-02 | Discharge: 2013-09-02 | Disposition: A | Discharge status: Home / Self Care | Payer: Medicare Other | Attending: Emergency Medicine | Admitting: Emergency Medicine

## 2013-09-02 ENCOUNTER — Encounter (HOSPITAL_COMMUNITY): Payer: Self-pay | Admitting: Emergency Medicine

## 2013-09-02 ENCOUNTER — Emergency Department (HOSPITAL_COMMUNITY): Payer: Medicare Other

## 2013-09-02 DIAGNOSIS — Z794 Long term (current) use of insulin: Secondary | ICD-10-CM | POA: Insufficient documentation

## 2013-09-02 DIAGNOSIS — R5381 Other malaise: Secondary | ICD-10-CM | POA: Insufficient documentation

## 2013-09-02 DIAGNOSIS — E119 Type 2 diabetes mellitus without complications: Secondary | ICD-10-CM | POA: Insufficient documentation

## 2013-09-02 DIAGNOSIS — R5383 Other fatigue: Secondary | ICD-10-CM

## 2013-09-02 DIAGNOSIS — I509 Heart failure, unspecified: Secondary | ICD-10-CM | POA: Insufficient documentation

## 2013-09-02 DIAGNOSIS — T50901A Poisoning by unspecified drugs, medicaments and biological substances, accidental (unintentional), initial encounter: Secondary | ICD-10-CM

## 2013-09-02 DIAGNOSIS — Z8719 Personal history of other diseases of the digestive system: Secondary | ICD-10-CM | POA: Insufficient documentation

## 2013-09-02 DIAGNOSIS — R630 Anorexia: Secondary | ICD-10-CM | POA: Insufficient documentation

## 2013-09-02 DIAGNOSIS — Z880 Allergy status to penicillin: Secondary | ICD-10-CM | POA: Insufficient documentation

## 2013-09-02 DIAGNOSIS — Y9210 Unspecified residential institution as the place of occurrence of the external cause: Secondary | ICD-10-CM | POA: Insufficient documentation

## 2013-09-02 DIAGNOSIS — T50991A Poisoning by other drugs, medicaments and biological substances, accidental (unintentional), initial encounter: Secondary | ICD-10-CM | POA: Insufficient documentation

## 2013-09-02 DIAGNOSIS — Z79899 Other long term (current) drug therapy: Secondary | ICD-10-CM | POA: Insufficient documentation

## 2013-09-02 DIAGNOSIS — I10 Essential (primary) hypertension: Secondary | ICD-10-CM | POA: Insufficient documentation

## 2013-09-02 DIAGNOSIS — I4891 Unspecified atrial fibrillation: Secondary | ICD-10-CM | POA: Insufficient documentation

## 2013-09-02 DIAGNOSIS — M129 Arthropathy, unspecified: Secondary | ICD-10-CM | POA: Insufficient documentation

## 2013-09-02 DIAGNOSIS — Z7902 Long term (current) use of antithrombotics/antiplatelets: Secondary | ICD-10-CM | POA: Insufficient documentation

## 2013-09-02 DIAGNOSIS — Y9389 Activity, other specified: Secondary | ICD-10-CM | POA: Insufficient documentation

## 2013-09-02 DIAGNOSIS — F3289 Other specified depressive episodes: Secondary | ICD-10-CM | POA: Insufficient documentation

## 2013-09-02 DIAGNOSIS — F329 Major depressive disorder, single episode, unspecified: Secondary | ICD-10-CM | POA: Insufficient documentation

## 2013-09-02 LAB — BASIC METABOLIC PANEL
BUN: 36 mg/dL — ABNORMAL HIGH (ref 6–23)
Chloride: 100 mEq/L (ref 96–112)
GFR calc Af Amer: 39 mL/min — ABNORMAL LOW (ref >90)
GFR calc non Af Amer: 34 mL/min — ABNORMAL LOW (ref >90)
Potassium: 5 mEq/L (ref 3.5–5.1)
Sodium: 135 mEq/L (ref 135–145)

## 2013-09-02 LAB — URINALYSIS, ROUTINE W REFLEX MICROSCOPIC
Hgb urine dipstick: NEGATIVE
Nitrite: NEGATIVE
Specific Gravity, Urine: 1.015 (ref 1.005–1.030)
Urobilinogen, UA: 0.2 mg/dL (ref 0.0–1.0)
pH: 5.5 (ref 5.0–8.0)

## 2013-09-02 LAB — CBC WITH DIFFERENTIAL/PLATELET
Basophils Relative: 1 % (ref 0–1)
Hemoglobin: 10.6 g/dL — ABNORMAL LOW (ref 12.0–15.0)
Lymphocytes Relative: 13 % (ref 12–46)
MCHC: 33.7 g/dL (ref 30.0–36.0)
Monocytes Relative: 11 % (ref 3–12)
Neutro Abs: 6.1 10*3/uL (ref 1.7–7.7)
Neutrophils Relative %: 66 % (ref 43–77)
RBC: 3.38 MIL/uL — ABNORMAL LOW (ref 3.87–5.11)
WBC: 9.3 10*3/uL (ref 4.0–10.5)

## 2013-09-02 LAB — URINE MICROSCOPIC-ADD ON

## 2013-09-02 NOTE — ED Notes (Signed)
Pt is a difficult stick. Attempted IV start x2 unsuccessful.  Iv rn paged.

## 2013-09-02 NOTE — ED Notes (Signed)
PER EMS- pt picked up [facility] with c/o pt given wrong medications.  Pt has no current symptoms.  Was given bactrim and pt reports she has an allergy to bactrim, however pt was also given other incorrect medications.  Pt sts "she doesn't even know anymore if she is allergic to sulfa drugs" Medications were given at 10a.  Pt arrives to ED alert and oriented per baseline. No complaints.

## 2013-09-02 NOTE — ED Notes (Signed)
Bed: ZO10 Expected date:  Expected time:  Means of arrival:  Comments: EMS-wrong med given-allergy to bactrim

## 2013-09-02 NOTE — ED Provider Notes (Signed)
CSN: [id number]     Arrival date & time 09/02/13  1120 History   First MD Initiated Contact with Patient 09/02/13 1130     Chief Complaint  Patient presents with  . Given wrong medication    (Consider location/radiation/quality/duration/timing/severity/associated sxs/prior Treatment) HPI Comments: Per pt's nursing facility, a new NA under training gave pt several wrong meds of aother pt's this morning, then realized the mistake & gave pt her home meds.  She recieved extra 20mg  PO lasix, 600mg  mucinex, 2 sennacot, 1 bactrim DS.  Nursing staff states she was at her baseline mental status, had admission about 1 week ago for pna/CHF, but had been doing well. Pt reports several days of generalized malaise, fatigue, mild cought this morning.  Denies CP, SOB, ab pain, n/v, urinary symptoms.  She has chronically loose stool.    Past Medical History  Diagnosis Date  . Hypertension   . Depression   . Erosive esophagitis 04/02/2012  . Mallory - Weiss tear 04/02/2012  . Dysrhythmia 02/24/2013    ATRIAL FIB WITH RVR  . Arthritis   . Atrial fibrillation   . CHF (congestive heart failure)   . Diabetes mellitus    Past Surgical History  Procedure Laterality Date  . Abdominal hysterectomy    . Appendectomy    . Cholecystectomy    . Esophagogastroduodenoscopy  03/31/2012    Procedure: ESOPHAGOGASTRODUODENOSCOPY (EGD);  Surgeon: Meryl Dare, MD,FACG;  Location: [facility];  Service: Endoscopy;  Laterality: N/A;   Family History  Problem Relation Age of Onset  . Cancer Mother    History  Substance Use Topics  . Smoking status: Never Smoker   . Smokeless tobacco: Never Used  . Alcohol Use: No   OB History   Grav Para Term Preterm Abortions TAB SAB Ect Mult Living                 Review of Systems  Constitutional: Positive for activity change, appetite change and fatigue. Negative for fever, chills and diaphoresis.  HENT: Negative for congestion, sore throat, facial swelling, rhinorrhea,  neck pain and neck stiffness.   Eyes: Negative for photophobia and discharge.  Respiratory: Negative for cough, chest tightness and shortness of breath.   Cardiovascular: Negative for chest pain, palpitations and leg swelling.  Gastrointestinal: Negative for nausea, vomiting, abdominal pain and diarrhea.  Endocrine: Negative for polydipsia and polyuria.  Genitourinary: Negative for dysuria, frequency, difficulty urinating and pelvic pain.  Musculoskeletal: Negative for back pain and arthralgias.  Skin: Negative for color change and wound.  Allergic/Immunologic: Negative for immunocompromised state.  Neurological: Negative for facial asymmetry, weakness, numbness and headaches.  Hematological: Does not bruise/bleed easily.  Psychiatric/Behavioral: Negative for confusion and agitation.    Allergies  Penicillins and Sulfa antibiotics  Home Medications   Current Outpatient Rx  Name  Route  Sig  Dispense  Refill  . albuterol (PROVENTIL HFA;VENTOLIN HFA) 108 (90 BASE) MCG/ACT inhaler   Inhalation   Inhale 2 puffs into the lungs every 6 (six) hours as needed for wheezing.   1 Inhaler   2   . albuterol (PROVENTIL) (2.5 MG/3ML) 0.083% nebulizer solution   Nebulization   Take 2.5 mg by nebulization every 6 (six) hours as needed for wheezing.         Marland Kitchen apixaban (ELIQUIS) 2.5 MG TABS tablet   Oral   Take 1 tablet (2.5 mg total) by mouth 2 (two) times daily.   20 tablet   5   . b  complex vitamins tablet   Oral   Take 1 tablet by mouth daily at 12 noon.         . cholecalciferol (VITAMIN D) 1000 UNITS tablet   Oral   Take 1,000 Units by mouth daily.         Marland Kitchen diltiazem (CARDIZEM CD) 180 MG 24 hr capsule   Oral   Take 1 capsule (180 mg total) by mouth daily.   30 capsule   0   . escitalopram (LEXAPRO) 10 MG tablet   Oral   Take 10 mg by mouth See admin instructions. On 08/31/13 dose reduced to 10mg  daily x 10 days then stop.         . furosemide (LASIX) 20 MG tablet    Oral   Take 40 mg by mouth daily.         . insulin glargine (LANTUS) 100 UNIT/ML injection   Subcutaneous   Inject 0.2 mLs (20 Units total) into the skin at bedtime.         . insulin regular (NOVOLIN R,HUMULIN R) 100 units/mL injection   Subcutaneous   Inject 2-12 Units into the skin 3 (three) times daily before meals. Sliding scale. See nursing home MAR.         . latanoprost (XALATAN) 0.005 % ophthalmic solution   Both Eyes   Place 1 drop into both eyes at bedtime.         . metoprolol tartrate (LOPRESSOR) 25 MG tablet   Oral   Take 1 tablet (25 mg total) by mouth 2 (two) times daily.   60 tablet   0   . Multiple Vitamins-Minerals (OCUVITE PRESERVISION PO)   Oral   Take 1 tablet by mouth 2 (two) times daily.         . Nutritional Supplements (BOOST DIABETIC) LIQD   Oral   Take 1 Bottle by mouth 3 (three) times daily after meals.   10 mL   0   . potassium chloride SA (K-DUR,KLOR-CON) 20 MEQ tablet   Oral   Take 2 tablets (40 mEq total) by mouth daily.         . pravastatin (PRAVACHOL) 40 MG tablet   Oral   Take 40 mg by mouth daily.         . QUEtiapine (SEROQUEL) 25 MG tablet   Oral   Take 6.25 mg by mouth at bedtime.         . sertraline (ZOLOFT) 50 MG tablet   Oral   Take 25-50 mg by mouth daily. On 9/11 started 25mg  daily x 7 days then increase to 50mg  daily.         Marland Kitchen spironolactone (ALDACTONE) 25 MG tablet   Oral   Take 12.5 mg by mouth every morning.         . timolol (TIMOPTIC) 0.5 % ophthalmic solution   Both Eyes   Place 1 drop into both eyes daily.          BP 115/50  Pulse 50  Temp(Src) 97.8 F (36.6 C) (Oral)  Resp 19  SpO2 95% Physical Exam  Constitutional: She is oriented to person, place, and time. She appears well-developed and well-nourished. No distress.  HENT:  Head: Normocephalic and atraumatic.  Mouth/Throat: No oropharyngeal exudate.  Eyes: Pupils are equal, round, and reactive to light.  Neck: Normal  range of motion. Neck supple.  Cardiovascular: Normal rate, regular rhythm and normal heart sounds.  Exam reveals no gallop and no friction  rub.   No murmur heard. Pulmonary/Chest: Effort normal and breath sounds normal. No respiratory distress. She has no wheezes. She has no rales.  Abdominal: Soft. Bowel sounds are normal. She exhibits no distension and no mass. There is no tenderness. There is no rebound and no guarding.  Musculoskeletal: Normal range of motion. She exhibits no edema and no tenderness.  Neurological: She is alert and oriented to person, place, and time. She has normal strength. No cranial nerve deficit or sensory deficit. She exhibits normal muscle tone. Coordination normal. GCS eye subscore is 4. GCS verbal subscore is 4. GCS motor subscore is 6.  Skin: Skin is warm and dry.  Psychiatric: She has a normal mood and affect.    ED Course  Procedures (including critical care time) Labs Review Labs Reviewed  BASIC METABOLIC PANEL - Abnormal; Notable for the following:    Glucose, Bld 190 (*)    BUN 36 (*)    Creatinine, Ser 1.33 (*)    GFR calc non Af Amer 34 (*)    GFR calc Af Amer 39 (*)    All other components within normal limits  CBC WITH DIFFERENTIAL - Abnormal; Notable for the following:    RBC 3.38 (*)    Hemoglobin 10.6 (*)    HCT 31.5 (*)    Monocytes Absolute 1.1 (*)    Eosinophils Relative 8 (*)    Eosinophils Absolute 0.8 (*)    All other components within normal limits  URINALYSIS, ROUTINE W REFLEX MICROSCOPIC - Abnormal; Notable for the following:    Leukocytes, UA SMALL (*)    All other components within normal limits  URINE MICROSCOPIC-ADD ON - Abnormal; Notable for the following:    Bacteria, UA FEW (*)    All other components within normal limits   Imaging Review Dg Chest 2 View  09/02/2013   CLINICAL DATA:  Hypertension, atrial fibrillation.  EXAM: CHEST  2 VIEW  COMPARISON:  08/14/2013  FINDINGS: Cardiomegaly. Left base atelectasis or  infiltrate. No confluent opacity on the right. No overt edema. No effusions. No acute bony abnormality.  IMPRESSION: Left basilar atelectasis or infiltrate. Cannot exclude pneumonia. Recommend clinical correlation.  Cardiomegaly.   Electronically Signed   By: Charlett Nose M.D.   On: 09/02/2013 13:00     Date: 09/02/2013  Rate: 63  Rhythm: normal sinus rhythm  QRS Axis: normal  Intervals: PR prolonged  ST/T Wave abnormalities: normal  Conduction Disutrbances:first-degree A-V block , PACs  Narrative Interpretation:   Old EKG Reviewed: changes noted, prior with afib   MDM   1. Accidental medication error, initial encounter   2. Fatigue    Pt is a 79 y.o. female with Pmhx as above who presents after receiving several of another resident's medications at her SNF around 10am. Per pt, she had been feeling fatigued and had dec appetite for several days, coughed once while eating this morning, but has not have fever, CP, ab pain, n/v, urinary symptoms. . SNF states she has been well, no fever, chills, SOB, cough, injuries, rash.  UA w/ what appears to be chronic leukocytosis.  Possible opacity vs atelectasis seen on CXR< seen also on prior for past several months.  Pt does not have clcincal findings to support pna.  She also does nto appear to have had an allergic reaction to medication.  Will d/c back to facility with Return precautions given for new or worsening symptoms including infectious s/sx.   1. Accidental medication error, initial encounter  2. Fatigue         Shanna Cisco, MD 09/02/13 2136

## 2013-11-11 ENCOUNTER — Emergency Department (HOSPITAL_COMMUNITY): Payer: Medicare Other

## 2013-11-11 ENCOUNTER — Encounter (HOSPITAL_COMMUNITY): Payer: Self-pay | Admitting: Emergency Medicine

## 2013-11-11 ENCOUNTER — Inpatient Hospital Stay (HOSPITAL_COMMUNITY)
Admission: EM | Admit: 2013-11-11 | Discharge: 2013-11-17 | DRG: 469 | Disposition: A | Discharge status: Skilled Nursing Facility | Payer: Medicare Other | Attending: Internal Medicine | Admitting: Internal Medicine

## 2013-11-11 DIAGNOSIS — Z9089 Acquired absence of other organs: Secondary | ICD-10-CM

## 2013-11-11 DIAGNOSIS — G9349 Other encephalopathy: Secondary | ICD-10-CM | POA: Diagnosis present

## 2013-11-11 DIAGNOSIS — S72001A Fracture of unspecified part of neck of right femur, initial encounter for closed fracture: Secondary | ICD-10-CM

## 2013-11-11 DIAGNOSIS — Z66 Do not resuscitate: Secondary | ICD-10-CM | POA: Diagnosis present

## 2013-11-11 DIAGNOSIS — Z8659 Personal history of other mental and behavioral disorders: Secondary | ICD-10-CM

## 2013-11-11 DIAGNOSIS — E119 Type 2 diabetes mellitus without complications: Secondary | ICD-10-CM | POA: Diagnosis present

## 2013-11-11 DIAGNOSIS — S7290XA Unspecified fracture of unspecified femur, initial encounter for closed fracture: Secondary | ICD-10-CM

## 2013-11-11 DIAGNOSIS — F329 Major depressive disorder, single episode, unspecified: Secondary | ICD-10-CM | POA: Diagnosis present

## 2013-11-11 DIAGNOSIS — I509 Heart failure, unspecified: Secondary | ICD-10-CM | POA: Diagnosis present

## 2013-11-11 DIAGNOSIS — Z7901 Long term (current) use of anticoagulants: Secondary | ICD-10-CM

## 2013-11-11 DIAGNOSIS — W010XXA Fall on same level from slipping, tripping and stumbling without subsequent striking against object, initial encounter: Secondary | ICD-10-CM | POA: Diagnosis present

## 2013-11-11 DIAGNOSIS — Z882 Allergy status to sulfonamides status: Secondary | ICD-10-CM

## 2013-11-11 DIAGNOSIS — S72033A Displaced midcervical fracture of unspecified femur, initial encounter for closed fracture: Principal | ICD-10-CM | POA: Diagnosis present

## 2013-11-11 DIAGNOSIS — N183 Chronic kidney disease, stage 3 unspecified: Secondary | ICD-10-CM | POA: Diagnosis present

## 2013-11-11 DIAGNOSIS — Z79899 Other long term (current) drug therapy: Secondary | ICD-10-CM

## 2013-11-11 DIAGNOSIS — I5031 Acute diastolic (congestive) heart failure: Secondary | ICD-10-CM

## 2013-11-11 DIAGNOSIS — D649 Anemia, unspecified: Secondary | ICD-10-CM | POA: Diagnosis present

## 2013-11-11 DIAGNOSIS — S72009A Fracture of unspecified part of neck of unspecified femur, initial encounter for closed fracture: Secondary | ICD-10-CM

## 2013-11-11 DIAGNOSIS — Y9210 Unspecified residential institution as the place of occurrence of the external cause: Secondary | ICD-10-CM | POA: Diagnosis present

## 2013-11-11 DIAGNOSIS — F3289 Other specified depressive episodes: Secondary | ICD-10-CM | POA: Diagnosis present

## 2013-11-11 DIAGNOSIS — I4891 Unspecified atrial fibrillation: Secondary | ICD-10-CM | POA: Diagnosis present

## 2013-11-11 DIAGNOSIS — G9340 Encephalopathy, unspecified: Secondary | ICD-10-CM

## 2013-11-11 DIAGNOSIS — I129 Hypertensive chronic kidney disease with stage 1 through stage 4 chronic kidney disease, or unspecified chronic kidney disease: Secondary | ICD-10-CM | POA: Diagnosis present

## 2013-11-11 DIAGNOSIS — N390 Urinary tract infection, site not specified: Secondary | ICD-10-CM | POA: Diagnosis present

## 2013-11-11 DIAGNOSIS — Z880 Allergy status to penicillin: Secondary | ICD-10-CM

## 2013-11-11 DIAGNOSIS — S7291XA Unspecified fracture of right femur, initial encounter for closed fracture: Secondary | ICD-10-CM

## 2013-11-11 DIAGNOSIS — Z794 Long term (current) use of insulin: Secondary | ICD-10-CM

## 2013-11-11 LAB — ABO/RH: ABO/RH(D): A POS

## 2013-11-11 LAB — BASIC METABOLIC PANEL
BUN: 41 mg/dL — ABNORMAL HIGH (ref 6–23)
Calcium: 9.4 mg/dL (ref 8.4–10.5)
GFR calc non Af Amer: 30 mL/min — ABNORMAL LOW (ref >90)
Glucose, Bld: 129 mg/dL — ABNORMAL HIGH (ref 70–99)

## 2013-11-11 LAB — CBC WITH DIFFERENTIAL/PLATELET
Basophils Absolute: 0.1 10*3/uL (ref 0.0–0.1)
Basophils Relative: 0 % (ref 0–1)
Eosinophils Absolute: 0.5 10*3/uL (ref 0.0–0.7)
MCH: 31.7 pg (ref 26.0–34.0)
MCHC: 35.7 g/dL (ref 30.0–36.0)
Neutrophils Relative %: 84 % — ABNORMAL HIGH (ref 43–77)
Platelets: 150 10*3/uL (ref 150–400)
RDW: 13.3 % (ref 11.5–15.5)

## 2013-11-11 LAB — PROTIME-INR
INR: 1.19 (ref 0.00–1.49)
Prothrombin Time: 14.8 seconds (ref 11.6–15.2)

## 2013-11-11 MED ORDER — ONDANSETRON HCL 4 MG/2ML IJ SOLN
4.0000 mg | Freq: Once | INTRAMUSCULAR | Status: AC
  Administered 2013-11-11: 4 mg via INTRAVENOUS
  Filled 2013-11-11: qty 2

## 2013-11-11 MED ORDER — MORPHINE SULFATE 4 MG/ML IJ SOLN
4.0000 mg | Freq: Once | INTRAMUSCULAR | Status: AC
  Administered 2013-11-11: 4 mg via INTRAVENOUS
  Filled 2013-11-11: qty 1

## 2013-11-11 MED ORDER — FENTANYL CITRATE 0.05 MG/ML IJ SOLN
50.0000 ug | Freq: Once | INTRAMUSCULAR | Status: AC
  Administered 2013-11-11: 50 ug via INTRAVENOUS
  Filled 2013-11-11: qty 2

## 2013-11-11 NOTE — ED Notes (Signed)
Bed: WA09 Expected date:  Expected time:  Means of arrival:  Comments: ems- 79 yo F

## 2013-11-11 NOTE — ED Provider Notes (Signed)
CSN: [id number]     Arrival date & time 11/11/13  1927 History   First MD Initiated Contact with Patient 11/11/13 2010     Chief Complaint  Patient presents with  . Fall   (Consider location/radiation/quality/duration/timing/severity/associated sxs/prior Treatment) HPI  This is a 79 year old female who presents following a fall. Patient reports falling when she was trying to transfer from a chair. She is at an assisted living community. She was able to fall to the phone to call for assistance. She is nonambulatory. She reports right lower extremity pain. She denies hitting her head or loss of consciousness. She's not currently on any anticoagulants. Patient denies any other injury.  Past Medical History  Diagnosis Date  . Hypertension   . Depression   . Erosive esophagitis 04/02/2012  . Mallory - Weiss tear 04/02/2012  . Dysrhythmia 02/24/2013    ATRIAL FIB WITH RVR  . Arthritis   . Atrial fibrillation   . CHF (congestive heart failure)   . Diabetes mellitus    Past Surgical History  Procedure Laterality Date  . Abdominal hysterectomy    . Appendectomy    . Cholecystectomy    . Esophagogastroduodenoscopy  03/31/2012    Procedure: ESOPHAGOGASTRODUODENOSCOPY (EGD);  Surgeon: Meryl Dare, MD,FACG;  Location: [facility];  Service: Endoscopy;  Laterality: N/A;   Family History  Problem Relation Age of Onset  . Cancer Mother    History  Substance Use Topics  . Smoking status: Never Smoker   . Smokeless tobacco: Never Used  . Alcohol Use: No   OB History   Grav Para Term Preterm Abortions TAB SAB Ect Mult Living                 Review of Systems  Respiratory: Negative for cough, chest tightness and shortness of breath.   Cardiovascular: Negative for chest pain.  Gastrointestinal: Negative for nausea, vomiting and abdominal pain.  Genitourinary: Negative for dysuria.  Musculoskeletal: Negative for back pain.       Right hip and thigh pain  Skin: Negative for wound.   Neurological: Negative for headaches.  Psychiatric/Behavioral: Negative for confusion.  All other systems reviewed and are negative.    Allergies  Penicillins and Sulfa antibiotics  Home Medications   Current Outpatient Rx  Name  Route  Sig  Dispense  Refill  . albuterol (PROVENTIL HFA;VENTOLIN HFA) 108 (90 BASE) MCG/ACT inhaler   Inhalation   Inhale 2 puffs into the lungs every 6 (six) hours as needed for wheezing.   1 Inhaler   2   . albuterol (PROVENTIL) (2.5 MG/3ML) 0.083% nebulizer solution   Nebulization   Take 2.5 mg by nebulization every 6 (six) hours as needed for wheezing.         Marland Kitchen apixaban (ELIQUIS) 2.5 MG TABS tablet   Oral   Take 1 tablet (2.5 mg total) by mouth 2 (two) times daily.   20 tablet   5   . b complex vitamins tablet   Oral   Take 1 tablet by mouth daily at 12 noon.         . cholecalciferol (VITAMIN D) 1000 UNITS tablet   Oral   Take 1,000 Units by mouth daily.         Marland Kitchen diltiazem (CARDIZEM CD) 180 MG 24 hr capsule   Oral   Take 1 capsule (180 mg total) by mouth daily.   30 capsule   0   . furosemide (LASIX) 20 MG tablet  Oral   Take 40 mg by mouth daily.         . insulin glargine (LANTUS) 100 UNIT/ML injection   Subcutaneous   Inject 0.2 mLs (20 Units total) into the skin at bedtime.         . insulin regular (NOVOLIN R,HUMULIN R) 100 units/mL injection   Subcutaneous   Inject 3-20 Units into the skin 3 (three) times daily before meals. Sliding scale. See nursing home MAR. 121-150 = 3 units 151-200=5 units 201-250= 12 units 251-300= 17 units >300=20 units         . latanoprost (XALATAN) 0.005 % ophthalmic solution   Both Eyes   Place 1 drop into both eyes at bedtime.         . metoprolol tartrate (LOPRESSOR) 25 MG tablet   Oral   Take 1 tablet (25 mg total) by mouth 2 (two) times daily.   60 tablet   0   . Multiple Vitamins-Minerals (OCUVITE PRESERVISION PO)   Oral   Take 1 tablet by mouth 2 (two)  times daily.         . Nutritional Supplements (BOOST DIABETIC) LIQD   Oral   Take 1 Bottle by mouth 3 (three) times daily after meals.   10 mL   0   . potassium chloride SA (K-DUR,KLOR-CON) 20 MEQ tablet   Oral   Take 2 tablets (40 mEq total) by mouth daily.         . pravastatin (PRAVACHOL) 40 MG tablet   Oral   Take 40 mg by mouth daily.         . QUEtiapine (SEROQUEL) 25 MG tablet   Oral   Take 6.25 mg by mouth at bedtime.         . sertraline (ZOLOFT) 50 MG tablet   Oral   Take 50 mg by mouth daily.          Marland Kitchen spironolactone (ALDACTONE) 25 MG tablet   Oral   Take 12.5 mg by mouth every morning.         . timolol (TIMOPTIC) 0.5 % ophthalmic solution   Both Eyes   Place 1 drop into both eyes daily.          BP 122/43  Pulse 78  Temp(Src) 98.5 F (36.9 C) (Oral)  Resp 15  SpO2 91% Physical Exam  Nursing note and vitals reviewed. Constitutional: She is oriented to person, place, and time. No distress.  Elderly  HENT:  Head: Normocephalic and atraumatic.  Cardiovascular: Normal rate and normal heart sounds.   Pulmonary/Chest: Effort normal. No respiratory distress.  Abdominal: Soft. Bowel sounds are normal. There is no tenderness.  Musculoskeletal:  Tenderness to palpation and decreased range of motion of the right hip joint, no foreshortening noted, good distal pulses, neurovascularly intact  Neurological: She is alert and oriented to person, place, and time.  Skin: Skin is warm and dry.  Psychiatric: She has a normal mood and affect.    ED Course  Procedures (including critical care time) Labs Review Labs Reviewed  BASIC METABOLIC PANEL - Abnormal; Notable for the following:    Sodium 134 (*)    Glucose, Bld 129 (*)    BUN 41 (*)    Creatinine, Ser 1.47 (*)    GFR calc non Af Amer 30 (*)    GFR calc Af Amer 35 (*)    All other components within normal limits  CBC WITH DIFFERENTIAL - Abnormal; Notable for the following:  WBC 14.5  (*)    RBC 3.60 (*)    Hemoglobin 11.4 (*)    HCT 31.9 (*)    Neutrophils Relative % 84 (*)    Neutro Abs 12.1 (*)    Lymphocytes Relative 6 (*)    All other components within normal limits  PROTIME-INR  URINALYSIS, ROUTINE W REFLEX MICROSCOPIC  TYPE AND SCREEN  ABO/RH   Imaging Review Dg Chest 2 View  11/11/2013   CLINICAL DATA:  Fall.  EXAM: CHEST  2 VIEW  COMPARISON:  09/02/2013  FINDINGS: The lungs are adequately inflated without definite consolidation or effusion. There is mild stable cardiomegaly. Remainder of the exam is unchanged.  IMPRESSION: No definite acute cardiopulmonary disease.  Stable cardiomegaly.   Electronically Signed   By: Elberta Fortis M.D.   On: 11/11/2013 20:56   Dg Hip 1 View Right  11/11/2013   CLINICAL DATA:  Fall.  Right hip pain.  EXAM: RIGHT HIP - 1 VIEW  COMPARISON:  03/29/2012  FINDINGS: Acute subcapital right femoral neck fracture noted with varus angulation and moderate displacement.  IMPRESSION: 1. Acute right femoral neck fracture.   Electronically Signed   By: Herbie Baltimore M.D.   On: 11/11/2013 20:20   Dg Femur Right  11/11/2013   CLINICAL DATA:  Right hip fracture  EXAM: RIGHT FEMUR - 2 VIEW  COMPARISON:  Prior radiograph of the right hip performed earlier on the same day.  FINDINGS: Again seen is an acute subcapital right femoral neck fracture, stable in position and alignment relative to prior radiograph. No other fracture or dislocation seen involving the right femur. The right knee is grossly approximated. No soft tissue abnormality.  IMPRESSION: Unchanged right femoral neck fracture. No other acute fracture involving the right femur.   Electronically Signed   By: Rise Mu M.D.   On: 11/11/2013 23:06    EKG Interpretation   None      EKG independently reviewed by myself: Normal sinus rhythm with a rate of 68, no evidence of ST elevation or acute ischemia MDM   1. Femur fracture, right, closed, initial encounter                 Patient presents with a mechanical fall.  Patient found to have fracture of the right hip. She is on pelvic with but denies hitting her head or loss of consciousness and she has no evidence of trauma elsewhere. Labwork was obtained. I discussed the patient with Dr. Roda Shutters who would like the patient to be transferred to [facility] for evaluation tomorrow. I discussed the patient with Dr. Toniann Fail who will admit the patient.    Shon Baton, MD 11/12/13 [phone]

## 2013-11-11 NOTE — H&P (Signed)
[facility] History and Physical  Morgan Morris ZOX:[id number] DOB: 05/14/1920 DOA: 11/11/2013  Referring physician: ER physician. PCP: Juline Patch, MD   Chief Complaint: Fall with right hip pain.  HPI: Morgan Morris is a 79 y.o. female with history of atrial fibrillation on apixaban had a fall at her living facility. Patient was trying to move a chair when she slipped and fell. Patient states that she did not hit her head or lose consciousness. In the ER x-rays show right hip fracture and orthopedic surgeon on call is planning surgery tomorrow later. Patient denies any chest pain shortness of breath nausea vomiting abdominal pain diarrhea fever chills dysuria. Patient was admitted last month for pneumonia and chest the patient is symptomatic.   Review of Systems: As presented in the history of presenting illness, rest negative.  Past Medical History  Diagnosis Date  . Hypertension   . Depression   . Erosive esophagitis 04/02/2012  . Mallory - Weiss tear 04/02/2012  . Dysrhythmia 02/24/2013    ATRIAL FIB WITH RVR  . Arthritis   . Atrial fibrillation   . CHF (congestive heart failure)   . Diabetes mellitus    Past Surgical History  Procedure Laterality Date  . Abdominal hysterectomy    . Appendectomy    . Cholecystectomy    . Esophagogastroduodenoscopy  03/31/2012    Procedure: ESOPHAGOGASTRODUODENOSCOPY (EGD);  Surgeon: Meryl Dare, MD,FACG;  Location: [facility];  Service: Endoscopy;  Laterality: N/A;   Social History:  reports that she has never smoked. She has never used smokeless tobacco. She reports that she does not drink alcohol or use illicit drugs. Where does patient live assisted living facility. Can patient participate in ADLs? Not sure.  Allergies  Allergen Reactions  . Penicillins     unknown  . Sulfa Antibiotics     unknown    Family History:  Family History  Problem Relation Age of Onset  . Cancer Mother       Prior to Admission  medications   Medication Sig Start Date End Date Taking? Authorizing Provider  albuterol (PROVENTIL HFA;VENTOLIN HFA) 108 (90 BASE) MCG/ACT inhaler Inhale 2 puffs into the lungs every 6 (six) hours as needed for wheezing. 02/27/13  Yes Richarda Overlie, MD  albuterol (PROVENTIL) (2.5 MG/3ML) 0.083% nebulizer solution Take 2.5 mg by nebulization every 6 (six) hours as needed for wheezing.   Yes Historical Provider, MD  apixaban (ELIQUIS) 2.5 MG TABS tablet Take 1 tablet (2.5 mg total) by mouth 2 (two) times daily. 02/27/13  Yes Richarda Overlie, MD  b complex vitamins tablet Take 1 tablet by mouth daily at 12 noon.   Yes Historical Provider, MD  cholecalciferol (VITAMIN D) 1000 UNITS tablet Take 1,000 Units by mouth daily.   Yes Historical Provider, MD  diltiazem (CARDIZEM CD) 180 MG 24 hr capsule Take 1 capsule (180 mg total) by mouth daily. 06/02/13  Yes Elease Etienne, MD  furosemide (LASIX) 20 MG tablet Take 40 mg by mouth daily.   Yes Historical Provider, MD  insulin glargine (LANTUS) 100 UNIT/ML injection Inject 0.2 mLs (20 Units total) into the skin at bedtime. 08/16/13  Yes Clydia Llano, MD  insulin regular (NOVOLIN R,HUMULIN R) 100 units/mL injection Inject 3-20 Units into the skin 3 (three) times daily before meals. Sliding scale. See nursing home MAR. 121-150 = 3 units 151-200=5 units 201-250= 12 units 251-300= 17 units >300=20 units   Yes Historical Provider, MD  latanoprost (XALATAN) 0.005 % ophthalmic solution Place  1 drop into both eyes at bedtime.   Yes Historical Provider, MD  metoprolol tartrate (LOPRESSOR) 25 MG tablet Take 1 tablet (25 mg total) by mouth 2 (two) times daily. 08/17/13  Yes Clydia Llano, MD  Multiple Vitamins-Minerals (OCUVITE PRESERVISION PO) Take 1 tablet by mouth 2 (two) times daily.   Yes Historical Provider, MD  Nutritional Supplements (BOOST DIABETIC) LIQD Take 1 Bottle by mouth 3 (three) times daily after meals. 08/17/13  Yes Clydia Llano, MD  potassium chloride SA  (K-DUR,KLOR-CON) 20 MEQ tablet Take 2 tablets (40 mEq total) by mouth daily. 04/01/13  Yes Zannie Cove, MD  pravastatin (PRAVACHOL) 40 MG tablet Take 40 mg by mouth daily.   Yes Historical Provider, MD  QUEtiapine (SEROQUEL) 25 MG tablet Take 6.25 mg by mouth at bedtime.   Yes Historical Provider, MD  sertraline (ZOLOFT) 50 MG tablet Take 50 mg by mouth daily.    Yes Historical Provider, MD  spironolactone (ALDACTONE) 25 MG tablet Take 12.5 mg by mouth every morning.   Yes Historical Provider, MD  timolol (TIMOPTIC) 0.5 % ophthalmic solution Place 1 drop into both eyes daily.   Yes Historical Provider, MD    Physical Exam: Filed Vitals:   11/11/13 1935  BP: 157/57  Pulse: 68  Temp: 98 F (36.7 C)  TempSrc: Oral  SpO2: 100%     General:  Well-developed well-nourished.  Eyes: Anicteric no pallor.  ENT: No discharge from ears eyes nose mouth.  Neck: No mass felt.  Cardiovascular: S1-S2 heard.  Respiratory: No rhonchi or crepitations.  Abdomen: Soft nontender bowel sounds present.  Skin: No rash.  Musculoskeletal: Pain on moving right hip.  Psychiatric: Appears normal.  Neurologic: Alert awake oriented to time place and person. Moves all extremities.  Labs on Admission:  Basic Metabolic Panel:  Recent Labs Lab 11/11/13 2135  NA 134*  K 3.7  CL 97  CO2 24  GLUCOSE 129*  BUN 41*  CREATININE 1.47*  CALCIUM 9.4   Liver Function Tests: No results found for this basename: AST, ALT, ALKPHOS, BILITOT, PROT, ALBUMIN,  in the last 168 hours No results found for this basename: LIPASE, AMYLASE,  in the last 168 hours No results found for this basename: AMMONIA,  in the last 168 hours CBC:  Recent Labs Lab 11/11/13 2135  WBC 14.5*  NEUTROABS 12.1*  HGB 11.4*  HCT 31.9*  MCV 88.6  PLT 150   Cardiac Enzymes: No results found for this basename: CKTOTAL, CKMB, CKMBINDEX, TROPONINI,  in the last 168 hours  BNP (last 3 results)  Recent Labs  03/31/13 0425  05/28/13 1752 08/12/13 1826  PROBNP 2796.0* 16021.0* 5176.0*   CBG: No results found for this basename: GLUCAP,  in the last 168 hours  Radiological Exams on Admission: Dg Chest 2 View  11/11/2013   CLINICAL DATA:  Fall.  EXAM: CHEST  2 VIEW  COMPARISON:  09/02/2013  FINDINGS: The lungs are adequately inflated without definite consolidation or effusion. There is mild stable cardiomegaly. Remainder of the exam is unchanged.  IMPRESSION: No definite acute cardiopulmonary disease.  Stable cardiomegaly.   Electronically Signed   By: Elberta Fortis M.D.   On: 11/11/2013 20:56   Dg Hip 1 View Right  11/11/2013   CLINICAL DATA:  Fall.  Right hip pain.  EXAM: RIGHT HIP - 1 VIEW  COMPARISON:  03/29/2012  FINDINGS: Acute subcapital right femoral neck fracture noted with varus angulation and moderate displacement.  IMPRESSION: 1. Acute right femoral neck  fracture.   Electronically Signed   By: Herbie Baltimore M.D.   On: 11/11/2013 20:20   Dg Femur Right  11/11/2013   CLINICAL DATA:  Right hip fracture  EXAM: RIGHT FEMUR - 2 VIEW  COMPARISON:  Prior radiograph of the right hip performed earlier on the same day.  FINDINGS: Again seen is an acute subcapital right femoral neck fracture, stable in position and alignment relative to prior radiograph. No other fracture or dislocation seen involving the right femur. The right knee is grossly approximated. No soft tissue abnormality.  IMPRESSION: Unchanged right femoral neck fracture. No other acute fracture involving the right femur.   Electronically Signed   By: Rise Mu M.D.   On: 11/11/2013 23:06    EKG: Independently reviewed. Normal sinus rhythm.  Assessment/Plan Principal Problem:   Femoral neck fracture Active Problems:   Atrial fibrillation   CKD (chronic kidney disease), stage III   Diabetes mellitus   1. Right hip fracture status post mechanical fall - from medical standpoint patient looks stable for surgery. Patient is on  apixaban and we have confirmed with patient's living facility that her last dose was today morning 8 AM. Patient surgery is planned later tomorrow by them apixaban will be out of the system. Further recommendations per orthopedic surgery. 2. Atrial fibrillation presently rate controlled - continue with rate limiting medications. Closely monitor in telemetry. For apixaban see #1. 3. Leukocytosis - probably reactionary. Patient is afebrile. Chest x-ray does not show any infiltrates. Check UA. 4. History of CHF - presently looks compensated. Hold diuretics until surgery. 5. Chronic kidney disease - creatinine appears to be at baseline. Closely follow intake output and metabolic panel. 6. Diabetes mellitus type 2 - closely follow CBGs as patient as patient has history of brittle diabetes. 7. Anemia - closely follow CBC.  Patient is being transferred to [facility] at the request of orthopedic surgeon. Patient is in agreement with transfer. Accepting physician will be Dr. Lynden Oxford.  Code Status: DO NOT RESUSCITATE.   Disposition Plan: Admit to inpatient.    Clotiel Troop N. [facility] Pager [phone].  If 7PM-7AM, please contact night-coverage [email or web address] Password [email or web address] 11/11/2013, 11:30 PM

## 2013-11-11 NOTE — Treatment Plan (Signed)
Will see patient and discuss treatment plan Friday am. Will plan for surgery Friday pm. Please clear for surgery. NPO after midnight. Hold anticoagulation. Please call with questions.  Mayra Reel, MD [facility] [phone] 10:08 PM

## 2013-11-11 NOTE — ED Notes (Signed)
Patient sustained a fall at [facility] while attempting to move a chair and fell on her right side. Patient states that she crawled to her front door for assistance. Patient had been lifted and placed in bed when EMS arrived. Shortening of the right lower extremity noted.

## 2013-11-12 ENCOUNTER — Encounter (HOSPITAL_COMMUNITY): Payer: Self-pay | Admitting: Certified Registered"

## 2013-11-12 ENCOUNTER — Inpatient Hospital Stay (HOSPITAL_COMMUNITY): Payer: Medicare Other | Admitting: Certified Registered"

## 2013-11-12 ENCOUNTER — Encounter (HOSPITAL_COMMUNITY): Payer: Medicare Other | Admitting: Certified Registered"

## 2013-11-12 ENCOUNTER — Encounter (HOSPITAL_COMMUNITY)
Admission: EM | Disposition: A | Discharge status: Skilled Nursing Facility | Payer: Self-pay | Source: Home / Self Care | Attending: Internal Medicine

## 2013-11-12 DIAGNOSIS — N390 Urinary tract infection, site not specified: Secondary | ICD-10-CM

## 2013-11-12 DIAGNOSIS — I509 Heart failure, unspecified: Secondary | ICD-10-CM

## 2013-11-12 DIAGNOSIS — I5031 Acute diastolic (congestive) heart failure: Secondary | ICD-10-CM

## 2013-11-12 DIAGNOSIS — S72009A Fracture of unspecified part of neck of unspecified femur, initial encounter for closed fracture: Secondary | ICD-10-CM

## 2013-11-12 HISTORY — PX: HIP ARTHROPLASTY: SHX981

## 2013-11-12 LAB — CBC
HCT: 34.5 % — ABNORMAL LOW (ref 36.0–46.0)
MCHC: 35.1 g/dL (ref 30.0–36.0)
Platelets: 156 10*3/uL (ref 150–400)
RBC: 3.82 MIL/uL — ABNORMAL LOW (ref 3.87–5.11)
RDW: 13.9 % (ref 11.5–15.5)
WBC: 14.6 10*3/uL — ABNORMAL HIGH (ref 4.0–10.5)

## 2013-11-12 LAB — BASIC METABOLIC PANEL
CO2: 26 mEq/L (ref 19–32)
Chloride: 99 mEq/L (ref 96–112)
GFR calc Af Amer: 36 mL/min — ABNORMAL LOW (ref >90)
GFR calc non Af Amer: 31 mL/min — ABNORMAL LOW (ref >90)
Potassium: 4.9 mEq/L (ref 3.5–5.1)
Sodium: 136 mEq/L (ref 135–145)

## 2013-11-12 LAB — URINALYSIS, ROUTINE W REFLEX MICROSCOPIC
Bilirubin Urine: NEGATIVE
Glucose, UA: NEGATIVE mg/dL
Nitrite: NEGATIVE
Specific Gravity, Urine: 1.014 (ref 1.005–1.030)
pH: 5.5 (ref 5.0–8.0)

## 2013-11-12 LAB — GLUCOSE, CAPILLARY
Glucose-Capillary: 266 mg/dL — ABNORMAL HIGH (ref 70–99)
Glucose-Capillary: 250 mg/dL — ABNORMAL HIGH (ref 70–99)
Glucose-Capillary: 122 mg/dL — ABNORMAL HIGH (ref 70–99)

## 2013-11-12 LAB — URINE MICROSCOPIC-ADD ON

## 2013-11-12 LAB — VITAMIN D 25 HYDROXY (VIT D DEFICIENCY, FRACTURES): Vit D, 25-Hydroxy: 38 ng/mL (ref 30–89)

## 2013-11-12 LAB — SURGICAL PCR SCREEN: MRSA, PCR: POSITIVE — AB

## 2013-11-12 SURGERY — HEMIARTHROPLASTY, HIP, DIRECT ANTERIOR APPROACH, FOR FRACTURE
Anesthesia: General | Site: Hip | Laterality: Right | Wound class: Clean

## 2013-11-12 MED ORDER — MORPHINE SULFATE 2 MG/ML IJ SOLN: 0.5000 mg | INTRAMUSCULAR | Status: DC | PRN

## 2013-11-12 MED ORDER — SODIUM CHLORIDE 0.9 % IV SOLN
INTRAVENOUS | Status: DC
  Administered 2013-11-12: 03:00:00 via INTRAVENOUS

## 2013-11-12 MED ORDER — LATANOPROST 0.005 % OP SOLN
1.0000 [drp] | Freq: Every day | OPHTHALMIC | Status: DC
  Administered 2013-11-12 – 2013-11-16 (×4): 1 [drp] via OPHTHALMIC
  Filled 2013-11-12: qty 2.5

## 2013-11-12 MED ORDER — LACTATED RINGERS IV SOLN
INTRAVENOUS | Status: DC | PRN
  Administered 2013-11-12: 23:00:00 via INTRAVENOUS

## 2013-11-12 MED ORDER — ATORVASTATIN CALCIUM 10 MG PO TABS
10.0000 mg | ORAL_TABLET | Freq: Every day | ORAL | Status: DC
  Administered 2013-11-12 – 2013-11-16 (×4): 10 mg via ORAL
  Filled 2013-11-12 (×6): qty 1

## 2013-11-12 MED ORDER — SIMVASTATIN 20 MG PO TABS
20.0000 mg | ORAL_TABLET | Freq: Every day | ORAL | Status: DC
  Filled 2013-11-12: qty 1

## 2013-11-12 MED ORDER — ALBUTEROL SULFATE (5 MG/ML) 0.5% IN NEBU: 2.5000 mg | INHALATION_SOLUTION | RESPIRATORY_TRACT | Status: DC | PRN

## 2013-11-12 MED ORDER — SERTRALINE HCL 50 MG PO TABS
50.0000 mg | ORAL_TABLET | Freq: Every day | ORAL | Status: DC
  Administered 2013-11-12 – 2013-11-17 (×6): 50 mg via ORAL
  Filled 2013-11-12 (×6): qty 1

## 2013-11-12 MED ORDER — DEXTROSE 5 % IV SOLN
1.0000 g | INTRAVENOUS | Status: DC
  Administered 2013-11-12 – 2013-11-15 (×4): 1 g via INTRAVENOUS
  Filled 2013-11-12 (×6): qty 10

## 2013-11-12 MED ORDER — INSULIN GLARGINE 100 UNIT/ML ~~LOC~~ SOLN
20.0000 [IU] | Freq: Every day | SUBCUTANEOUS | Status: DC
  Administered 2013-11-12 – 2013-11-16 (×5): 20 [IU] via SUBCUTANEOUS
  Filled 2013-11-12 (×8): qty 0.2

## 2013-11-12 MED ORDER — QUETIAPINE FUMARATE 25 MG PO TABS
6.2500 mg | ORAL_TABLET | Freq: Every day | ORAL | Status: DC
  Administered 2013-11-12 – 2013-11-16 (×4): 12.5 mg via ORAL
  Filled 2013-11-12 (×7): qty 0.5

## 2013-11-12 MED ORDER — METOPROLOL TARTRATE 25 MG PO TABS
25.0000 mg | ORAL_TABLET | Freq: Two times a day (BID) | ORAL | Status: DC
  Administered 2013-11-12 – 2013-11-17 (×9): 25 mg via ORAL
  Filled 2013-11-12 (×13): qty 1

## 2013-11-12 MED ORDER — CHLORHEXIDINE GLUCONATE CLOTH 2 % EX PADS
6.0000 | MEDICATED_PAD | Freq: Every day | CUTANEOUS | Status: AC
  Administered 2013-11-12 – 2013-11-14 (×2): 6 via TOPICAL

## 2013-11-12 MED ORDER — MORPHINE SULFATE 4 MG/ML IJ SOLN
4.0000 mg | Freq: Once | INTRAMUSCULAR | Status: AC
  Administered 2013-11-12: 4 mg via INTRAVENOUS

## 2013-11-12 MED ORDER — TIMOLOL MALEATE 0.5 % OP SOLN
1.0000 [drp] | Freq: Every day | OPHTHALMIC | Status: DC
  Administered 2013-11-12 – 2013-11-17 (×6): 1 [drp] via OPHTHALMIC
  Filled 2013-11-12: qty 5

## 2013-11-12 MED ORDER — ALBUTEROL SULFATE HFA 108 (90 BASE) MCG/ACT IN AERS: 2.0000 | INHALATION_SPRAY | Freq: Four times a day (QID) | RESPIRATORY_TRACT | Status: DC | PRN

## 2013-11-12 MED ORDER — MORPHINE SULFATE 4 MG/ML IJ SOLN
INTRAMUSCULAR | Status: AC
  Filled 2013-11-12: qty 1

## 2013-11-12 MED ORDER — CLINDAMYCIN PHOSPHATE 600 MG/50ML IV SOLN
600.0000 mg | Freq: Once | INTRAVENOUS | Status: AC
  Administered 2013-11-13: 600 mg via INTRAVENOUS
  Filled 2013-11-12: qty 50

## 2013-11-12 MED ORDER — INSULIN ASPART 100 UNIT/ML ~~LOC~~ SOLN
0.0000 [IU] | Freq: Three times a day (TID) | SUBCUTANEOUS | Status: DC
  Administered 2013-11-12: 3 [IU] via SUBCUTANEOUS
  Administered 2013-11-12: 2 [IU] via SUBCUTANEOUS
  Administered 2013-11-12: 1 [IU] via SUBCUTANEOUS
  Administered 2013-11-13 (×2): 7 [IU] via SUBCUTANEOUS
  Administered 2013-11-13: 5 [IU] via SUBCUTANEOUS
  Administered 2013-11-14: 9 [IU] via SUBCUTANEOUS

## 2013-11-12 MED ORDER — MUPIROCIN 2 % EX OINT
TOPICAL_OINTMENT | Freq: Two times a day (BID) | CUTANEOUS | Status: AC
  Administered 2013-11-12 – 2013-11-16 (×7): via NASAL
  Administered 2013-11-16: 1 via NASAL
  Filled 2013-11-12: qty 22

## 2013-11-12 MED ORDER — DILTIAZEM HCL ER COATED BEADS 180 MG PO CP24
180.0000 mg | ORAL_CAPSULE | Freq: Every day | ORAL | Status: DC
  Administered 2013-11-12 – 2013-11-17 (×5): 180 mg via ORAL
  Filled 2013-11-12 (×6): qty 1

## 2013-11-12 MED ORDER — HYDROCODONE-ACETAMINOPHEN 5-325 MG PO TABS: 1.0000 | ORAL_TABLET | Freq: Four times a day (QID) | ORAL | Status: DC | PRN

## 2013-11-12 MED ORDER — VITAMIN D3 25 MCG (1000 UNIT) PO TABS
1000.0000 [IU] | ORAL_TABLET | Freq: Every day | ORAL | Status: DC
  Administered 2013-11-12 – 2013-11-17 (×6): 1000 [IU] via ORAL
  Filled 2013-11-12 (×6): qty 1

## 2013-11-12 MED ORDER — GLUCERNA SHAKE PO LIQD
237.0000 mL | Freq: Three times a day (TID) | ORAL | Status: DC
  Administered 2013-11-13 – 2013-11-17 (×9): 237 mL via ORAL

## 2013-11-12 SURGICAL SUPPLY — 76 items
BENZOIN TINCTURE PRP APPL 2/3 (GAUZE/BANDAGES/DRESSINGS) ×2 IMPLANT
BLADE SAW SAG 73X25 THK (BLADE) ×1
BLADE SAW SGTL 73X25 THK (BLADE) ×1 IMPLANT
BLADE SURG ROTATE 9660 (MISCELLANEOUS) IMPLANT
BRUSH FEMORAL CANAL (MISCELLANEOUS) IMPLANT
CLOTH BEACON ORANGE TIMEOUT ST (SAFETY) ×2 IMPLANT
COVER BACK TABLE 24X17X13 BIG (DRAPES) IMPLANT
DERMABOND ADVANCED (GAUZE/BANDAGES/DRESSINGS) ×1
DERMABOND ADVANCED .7 DNX12 (GAUZE/BANDAGES/DRESSINGS) ×1 IMPLANT
DRAPE HIP W/POCKET STRL (DRAPE) ×2 IMPLANT
DRAPE INCISE IOBAN 66X45 STRL (DRAPES) ×4 IMPLANT
DRAPE ORTHO SPLIT 77X108 STRL (DRAPES) ×2
DRAPE PROXIMA HALF (DRAPES) ×2 IMPLANT
DRAPE SURG ORHT 6 SPLT 77X108 (DRAPES) ×2 IMPLANT
DRAPE U-SHAPE 47X51 STRL (DRAPES) ×2 IMPLANT
DRESSING AQUACEL AQ EXTRA 4X5 (GAUZE/BANDAGES/DRESSINGS) IMPLANT
DRSG AQUACEL AG ADV 3.5X10 (GAUZE/BANDAGES/DRESSINGS) ×2 IMPLANT
DRSG MEPILEX BORDER 4X8 (GAUZE/BANDAGES/DRESSINGS) ×2 IMPLANT
DRSG PAD ABDOMINAL 8X10 ST (GAUZE/BANDAGES/DRESSINGS) ×4 IMPLANT
DURAPREP 26ML APPLICATOR (WOUND CARE) ×2 IMPLANT
ELECT CAUTERY BLADE 6.4 (BLADE) ×2 IMPLANT
ELECT REM PT RETURN 9FT ADLT (ELECTROSURGICAL) ×2
ELECTRODE REM PT RTRN 9FT ADLT (ELECTROSURGICAL) ×1 IMPLANT
EVACUATOR 1/8 PVC DRAIN (DRAIN) ×2 IMPLANT
FACESHIELD LNG OPTICON STERILE (SAFETY) ×4 IMPLANT
GAUZE XEROFORM 5X9 LF (GAUZE/BANDAGES/DRESSINGS) ×2 IMPLANT
GLOVE BIOGEL PI IND STRL 7.0 (GLOVE) ×4 IMPLANT
GLOVE BIOGEL PI IND STRL 7.5 (GLOVE) ×1 IMPLANT
GLOVE BIOGEL PI IND STRL 8 (GLOVE) IMPLANT
GLOVE BIOGEL PI INDICATOR 7.0 (GLOVE) ×4
GLOVE BIOGEL PI INDICATOR 7.5 (GLOVE) ×1
GLOVE BIOGEL PI INDICATOR 8 (GLOVE)
GLOVE BIOGEL PI ORTHO PRO 7.5 (GLOVE) ×3
GLOVE ECLIPSE 7.0 STRL STRAW (GLOVE) IMPLANT
GLOVE ORTHO TXT STRL SZ7.5 (GLOVE) ×2 IMPLANT
GLOVE PI ORTHO PRO STRL 7.5 (GLOVE) ×3 IMPLANT
GLOVE SKINSENSE NS SZ7.5 (GLOVE) ×2
GLOVE SKINSENSE STRL SZ7.5 (GLOVE) ×2 IMPLANT
GOWN PREVENTION PLUS LG XLONG (DISPOSABLE) IMPLANT
GOWN PREVENTION PLUS XLARGE (GOWN DISPOSABLE) ×2 IMPLANT
GOWN STRL NON-REIN LRG LVL3 (GOWN DISPOSABLE) ×2 IMPLANT
HANDPIECE INTERPULSE COAX TIP (DISPOSABLE) ×1
HIP BIPOLAR TANDEM POROUS ×2 IMPLANT
IMMOBILIZER KNEE 22 UNIV (SOFTGOODS) IMPLANT
KIT BASIN OR (CUSTOM PROCEDURE TRAY) ×2 IMPLANT
KIT ROOM TURNOVER OR (KITS) ×2 IMPLANT
MANIFOLD NEPTUNE II (INSTRUMENTS) ×2 IMPLANT
NDL SUT 2 .5 CRC MAYO 1.732X (NEEDLE) ×1 IMPLANT
NEEDLE HYPO 25GX1X1/2 BEV (NEEDLE) IMPLANT
NEEDLE MAYO TAPER (NEEDLE) ×1
NS IRRIG 1000ML POUR BTL (IV SOLUTION) ×2 IMPLANT
PACK TOTAL JOINT (CUSTOM PROCEDURE TRAY) ×2 IMPLANT
PAD ARMBOARD 7.5X6 YLW CONV (MISCELLANEOUS) ×4 IMPLANT
PIN STEINMAN 3/16 (PIN) ×2 IMPLANT
SET HNDPC FAN SPRY TIP SCT (DISPOSABLE) ×1 IMPLANT
SPONGE GAUZE 4X4 12PLY (GAUZE/BANDAGES/DRESSINGS) ×2 IMPLANT
SPONGE LAP 4X18 X RAY DECT (DISPOSABLE) IMPLANT
STAPLER VISISTAT 35W (STAPLE) ×2 IMPLANT
STRIP CLOSURE SKIN 1/2X4 (GAUZE/BANDAGES/DRESSINGS) ×4 IMPLANT
SUCTION FRAZIER TIP 10 FR DISP (SUCTIONS) ×2 IMPLANT
SUT ETHIBOND 5 LR DA (SUTURE) ×2 IMPLANT
SUT ETHIBOND NAB CT1 #1 30IN (SUTURE) ×6 IMPLANT
SUT PDS AB 1 CT  36 (SUTURE) ×2
SUT PDS AB 1 CT 36 (SUTURE) ×2 IMPLANT
SUT TICRON (SUTURE) IMPLANT
SUT VIC AB 0 CT1 27 (SUTURE) ×1
SUT VIC AB 0 CT1 27XBRD ANBCTR (SUTURE) ×1 IMPLANT
SUT VIC AB 2-0 CT1 27 (SUTURE) ×2
SUT VIC AB 2-0 CT1 TAPERPNT 27 (SUTURE) ×2 IMPLANT
SUT VICRYL 0 TIES 12 18 (SUTURE) IMPLANT
SYR CONTROL 10ML LL (SYRINGE) ×2 IMPLANT
TOWEL OR 17X24 6PK STRL BLUE (TOWEL DISPOSABLE) ×2 IMPLANT
TOWEL OR 17X26 10 PK STRL BLUE (TOWEL DISPOSABLE) ×2 IMPLANT
TOWER CARTRIDGE SMART MIX (DISPOSABLE) IMPLANT
TRAY FOLEY CATH 16FRSI W/METER (SET/KITS/TRAYS/PACK) IMPLANT
WATER STERILE IRR 1000ML POUR (IV SOLUTION) ×4 IMPLANT

## 2013-11-12 NOTE — Anesthesia Preprocedure Evaluation (Signed)
Anesthesia Evaluation  Patient identified by MRN, date of birth, ID band Patient awake    Reviewed: Allergy & Precautions, H&P , NPO status , Patient's Chart, lab work & pertinent test results  Airway Mallampati: II  Neck ROM: full    Dental   Pulmonary shortness of breath,          Cardiovascular hypertension, +CHF + dysrhythmias Atrial Fibrillation     Neuro/Psych Depression    GI/Hepatic PUD,   Endo/Other  diabetes, Type 2  Renal/GU Renal InsufficiencyRenal disease     Musculoskeletal  (+) Arthritis -,   Abdominal   Peds  Hematology   Anesthesia Other Findings   Reproductive/Obstetrics                           Anesthesia Physical Anesthesia Plan  ASA: III  Anesthesia Plan: General   Post-op Pain Management:    Induction: Intravenous  Airway Management Planned: Oral ETT  Additional Equipment:   Intra-op Plan:   Post-operative Plan: Extubation in OR  Informed Consent: I have reviewed the patients History and Physical, chart, labs and discussed the procedure including the risks, benefits and alternatives for the proposed anesthesia with the patient or authorized representative who has indicated his/her understanding and acceptance.     Plan Discussed with: CRNA, Anesthesiologist and Surgeon  Anesthesia Plan Comments:         Anesthesia Quick Evaluation

## 2013-11-12 NOTE — Progress Notes (Signed)
ANTIBIOTIC CONSULT NOTE - INITIAL  Pharmacy Consult for Ceftriaxone Indication: UTI  Allergies  Allergen Reactions  . Penicillins     unknown  . Sulfa Antibiotics     unknown    Patient Measurements: Height: 5\' 2"  (157.5 cm) Weight: 148 lb (67.132 kg) (bed scale) IBW/kg (Calculated) : 50.1  Labs:  Recent Labs  11/11/13 2135 11/12/13 0551  WBC 14.5* 14.6*  HGB 11.4* 12.1  PLT 150 156  CREATININE 1.47* 1.43*   Estimated Creatinine Clearance: 23 ml/min (by C-G formula based on Cr of 1.43).  Microbiology: Recent Results (from the past 720 hour(s))  SURGICAL PCR SCREEN     Status: Abnormal   Collection Time    11/12/13  4:22 AM      Result Value Range Status   MRSA, PCR POSITIVE (*) NEGATIVE Final   Staphylococcus aureus POSITIVE (*) NEGATIVE Final   Comment:            The Xpert SA Assay (FDA     approved for NASAL specimens     in patients over 44 years of age),     is one component of     a comprehensive surveillance     program.  Test performance has     been validated by [employer] for patients greater     than or equal to 91 year old.     It is not intended     to diagnose infection nor to     guide or monitor treatment.    Medical History: Past Medical History  Diagnosis Date  . Hypertension   . Depression   . Erosive esophagitis 04/02/2012  . Mallory - Weiss tear 04/02/2012  . Dysrhythmia 02/24/2013    ATRIAL FIB WITH RVR  . Arthritis   . Atrial fibrillation   . CHF (congestive heart failure)   . Diabetes mellitus    Assessment:   To begin Ceftriaxone for UTI.  Hx PCN allergy with unknown reaction.  Has had Ceftriaxone during prior admissions.    Urine culture sent.  Goal of Therapy:  appropriate Ceftriaxone dose for renal function and infection  Plan:   Ceftriaxone 1 gram IV q24hrs,   No dosage adjustments will needed, but will follow up culture results with you.  Dennie Fetters 11/12/2013,2:22 PM

## 2013-11-12 NOTE — Progress Notes (Signed)
[facility] called for transport. 

## 2013-11-12 NOTE — Progress Notes (Signed)
Report called to Jill Alexanders, RN on [employer].

## 2013-11-12 NOTE — Progress Notes (Signed)
[facility]                                                                                Patient Demographics  Morgan Morris, is a 79 y.o. female, DOB - 22-Dec-1920, NFA:[id number]  Admit date - 11/11/2013   Admitting Physician Eduard Clos, MD  Outpatient Primary MD for the patient is Juline Patch, MD  LOS - 1   Chief Complaint  Patient presents with  . Fall        Assessment & Plan    Principal Problem:   Femoral neck fracture Active Problems:   Atrial fibrillation   CKD (chronic kidney disease), stage III   Diabetes mellitus  Right hip fracture status post mechanical fall  -Orthopedics consulted, patient is to have surgery today -Will continue pain control. -Patient will likely need rehabilitation post surgery.  Atrial fibrillation  -Currently rate controlled with Lopressor and diltiazem -Eliquis held secondary to anticipated surgery  Leukocytosis secondary to urinary tract infection -UA showed TNTC WBC and large leukocytes -Will start patient on ceftriaxone -Pending urine culture  History of CHF -Currently euvolemic -Diuretics held until after surgery  Chronic kidney disease  -Creatinine appears to be at baseline will continue to monitor.  Diabetes mellitus type 2 -Currently patient is n.p.o. -Will continue to complain scale and Lantus with CBGs.  Anemia  -Hemoglobin 12.1 currently stable continue to monitor CBC   Depression -Continue Seroquel and Zoloft  Code Status: Full  Family Communication: Son at bedside  Disposition Plan: Admitted   Procedures None  Consults  Ortho  DVT Prophylaxis  SCDs   Lab Results  Component Value Date   PLT 156 11/12/2013    Medications  Scheduled Meds: . atorvastatin  10 mg Oral q1800  . Chlorhexidine Gluconate Cloth  6 each Topical Q0600  . cholecalciferol  1,000 Units Oral Daily  . diltiazem  180 mg Oral Daily  . feeding supplement (GLUCERNA SHAKE)  237 mL Oral TID PC  . insulin  aspart  0-9 Units Subcutaneous TID WC  . insulin glargine  20 Units Subcutaneous QHS  . latanoprost  1 drop Both Eyes QHS  . metoprolol tartrate  25 mg Oral BID  . mupirocin ointment   Nasal BID  . QUEtiapine  12.5 mg Oral QHS  . sertraline  50 mg Oral Daily  . timolol  1 drop Both Eyes Daily   Continuous Infusions: . sodium chloride 20 mL/hr at 11/12/13 0246   PRN Meds:.albuterol, albuterol, HYDROcodone-acetaminophen, morphine injection  Antibiotics    Anti-infectives   None      Time Spent in minutes   30 minutes   Damek Ende D.O. on 11/12/2013 at 11:50 AM  Between 7am to 7pm - Pager - [phone]  After 7pm go to [email or web address] - password [email or web address]  And look for the night coverage person covering for me after hours  [facility] Office  [phone]    Subjective:   Morgan Morris seen and examined today.  Patient complains of right hip pain.  She has no other complaints. Patient denies dizziness, chest pain, shortness of breath, abdominal pain, N/V/D/C, new weakness, numbess, tingling.    Objective:   Filed  Vitals:   11/12/13 0207 11/12/13 0208 11/12/13 0900 11/12/13 1006  BP: 126/52   128/60  Pulse: 83   74  Temp: 98.6 F (37 C)     TempSrc: Oral     Resp: 16     Height:   5\' 2"  (1.575 m)   Weight:   67.132 kg (148 lb)   SpO2: 90% 96%      Wt Readings from Last 3 Encounters:  11/12/13 67.132 kg (148 lb)  11/12/13 67.132 kg (148 lb)  08/17/13 59.512 kg (131 lb 3.2 oz)     Intake/Output Summary (Last 24 hours) at 11/12/13 1150 Last data filed at 11/12/13 0205  Gross per 24 hour  Intake      0 ml  Output      1 ml  Net     -1 ml    Exam  General: Well developed, well nourished, NAD, appears stated age  HEENT: NCAT, PERRLA, EOMI, Anicteic Sclera, mucous membranes moist. No pharyngeal erythema or exudates  Neck: Supple, no JVD, no masses  Cardiovascular: S1 S2 auscultated, no rubs, murmurs or gallops. Regular rate and  rhythm.  Respiratory: Clear to auscultation bilaterally with equal chest rise  Abdomen: Soft, nontender, nondistended, + bowel sounds  Extremities: warm dry without cyanosis clubbing or edema  Neuro: AAOx3, cranial nerves grossly intact. Strength appropriate for age in the RUE, LLE, LUE.  RLE strength not assessed due to hip pain.  Skin: Without rashes exudates or nodules  Psych: Normal affect and demeanor, depressed mood  Data Review   Micro Results Recent Results (from the past 240 hour(s))  SURGICAL PCR SCREEN     Status: Abnormal   Collection Time    11/12/13  4:22 AM      Result Value Range Status   MRSA, PCR POSITIVE (*) NEGATIVE Final   Staphylococcus aureus POSITIVE (*) NEGATIVE Final   Comment:            The Xpert SA Assay (FDA     approved for NASAL specimens     in patients over 39 years of age),     is one component of     a comprehensive surveillance     program.  Test performance has     been validated by [employer] for patients greater     than or equal to 61 year old.     It is not intended     to diagnose infection nor to     guide or monitor treatment.    Radiology Reports Dg Chest 2 View  11/11/2013   CLINICAL DATA:  Fall.  EXAM: CHEST  2 VIEW  COMPARISON:  09/02/2013  FINDINGS: The lungs are adequately inflated without definite consolidation or effusion. There is mild stable cardiomegaly. Remainder of the exam is unchanged.  IMPRESSION: No definite acute cardiopulmonary disease.  Stable cardiomegaly.   Electronically Signed   By: Elberta Fortis M.D.   On: 11/11/2013 20:56   Dg Hip 1 View Right  11/11/2013   CLINICAL DATA:  Fall.  Right hip pain.  EXAM: RIGHT HIP - 1 VIEW  COMPARISON:  03/29/2012  FINDINGS: Acute subcapital right femoral neck fracture noted with varus angulation and moderate displacement.  IMPRESSION: 1. Acute right femoral neck fracture.   Electronically Signed   By: Herbie Baltimore M.D.   On: 11/11/2013 20:20   Dg Femur  Right  11/11/2013   CLINICAL DATA:  Right hip fracture  EXAM: RIGHT FEMUR - 2 VIEW  COMPARISON:  Prior radiograph of the right hip performed earlier on the same day.  FINDINGS: Again seen is an acute subcapital right femoral neck fracture, stable in position and alignment relative to prior radiograph. No other fracture or dislocation seen involving the right femur. The right knee is grossly approximated. No soft tissue abnormality.  IMPRESSION: Unchanged right femoral neck fracture. No other acute fracture involving the right femur.   Electronically Signed   By: Rise Mu M.D.   On: 11/11/2013 23:06    CBC  Recent Labs Lab 11/11/13 2135 11/12/13 0551  WBC 14.5* 14.6*  HGB 11.4* 12.1  HCT 31.9* 34.5*  PLT 150 156  MCV 88.6 90.3  MCH 31.7 31.7  MCHC 35.7 35.1  RDW 13.3 13.9  LYMPHSABS 0.8  --   MONOABS 1.0  --   EOSABS 0.5  --   BASOSABS 0.1  --     Chemistries   Recent Labs Lab 11/11/13 2135 11/12/13 0551  NA 134* 136  K 3.7 4.9  CL 97 99  CO2 24 26  GLUCOSE 129* 285*  BUN 41* 39*  CREATININE 1.47* 1.43*  CALCIUM 9.4 9.1   ------------------------------------------------------------------------------------------------------------------ estimated creatinine clearance is 23 ml/min (by C-G formula based on Cr of 1.43). ------------------------------------------------------------------------------------------------------------------ No results found for this basename: HGBA1C,  in the last 72 hours ------------------------------------------------------------------------------------------------------------------ No results found for this basename: CHOL, HDL, LDLCALC, TRIG, CHOLHDL, LDLDIRECT,  in the last 72 hours ------------------------------------------------------------------------------------------------------------------ No results found for this basename: TSH, T4TOTAL, FREET3, T3FREE, THYROIDAB,  in the last 72  hours ------------------------------------------------------------------------------------------------------------------ No results found for this basename: VITAMINB12, FOLATE, FERRITIN, TIBC, IRON, RETICCTPCT,  in the last 72 hours  Coagulation profile  Recent Labs Lab 11/11/13 2135  INR 1.19    No results found for this basename: DDIMER,  in the last 72 hours  Cardiac Enzymes No results found for this basename: CK, CKMB, TROPONINI, MYOGLOBIN,  in the last 168 hours ------------------------------------------------------------------------------------------------------------------ No components found with this basename: POCBNP,

## 2013-11-12 NOTE — Progress Notes (Signed)
Orthopedic Tech Progress Note Patient Details:  [name] [birth year] [id number]  Patient ID: Morgan Morris, female   DOB: [birth year], 79 y.o.   MRN: [id number]   Shawnie Pons 11/12/2013, 2:13 PMTrapeze bar

## 2013-11-12 NOTE — Progress Notes (Signed)
UR completed. Billie Intriago RN CCM Case Mgmt phone [phone] 

## 2013-11-12 NOTE — Progress Notes (Signed)
INITIAL NUTRITION ASSESSMENT  DOCUMENTATION CODES Per approved criteria  -Not Applicable   INTERVENTION:  1. Continue Glucerna Shake po TID, each supplement provides 220 kcal and 10 grams of protein, once diet advanced.   NUTRITION DIAGNOSIS: Increased nutrient needs related to fracture as evidenced by estimated needs.   Goal: Pt to meet >/= 90% of their estimated nutrition needs   Monitor:  Diet advancement, PO intake, supplement acceptance, weight trend, labs  Reason for Assessment: Hip Fracture Protocol Consult   79 y.o. female  Admitting Dx: Femoral neck fracture  ASSESSMENT: Pt admitted from her living facitily after a fall with hip pain. Pt has right femoral neck fracture. Plan for surgery 11/21 pm.  Pt reports that she has had no recent weight changes and good appetite PTA. Pt eats Breakfast every morning in her room (cereal, 1/2 banana, milk). Lunch and Laural Golden are in [facility] where she lives. Pt has been drinking some form of supplement PTA but does not feel she needs it. Pt is willing to drink supplement once on diet for now until she is healed from surgery. Son at bedside but did not add to conversation.   Height: Ht Readings from Last 1 Encounters:  11/12/13 5\' 2"  (1.575 m)    Weight: Wt Readings from Last 1 Encounters:  11/12/13 148 lb (67.132 kg)    Ideal Body Weight: 50 kg  % Ideal Body Weight: 119%  Wt Readings from Last 10 Encounters:  11/12/13 148 lb (67.132 kg)  11/12/13 148 lb (67.132 kg)  08/17/13 131 lb 3.2 oz (59.512 kg)  06/03/13 145 lb 1 oz (65.8 kg)  04/01/13 138 lb 7.2 oz (62.8 kg)  02/27/13 152 lb (68.947 kg)  03/29/12 146 lb 14.4 oz (66.633 kg)  03/29/12 146 lb 14.4 oz (66.633 kg)  03/29/12 146 lb 14.4 oz (66.633 kg)    Usual Body Weight: 145 lb   % Usual Body Weight: 100%  BMI:  Body mass index is 27.06 kg/(m^2).  Estimated Nutritional Needs: Kcal: 1400-1600 Protein: 60-75 grams Fluid: >1.5 L/day  Skin: no issues  noted  Diet Order: NPO  EDUCATION NEEDS: -No education needs identified at this time   Intake/Output Summary (Last 24 hours) at 11/12/13 0939 Last data filed at 11/12/13 0205  Gross per 24 hour  Intake      0 ml  Output      1 ml  Net     -1 ml    Last BM: 11/20   Labs:   Recent Labs Lab 11/11/13 2135 11/12/13 0551  NA 134* 136  K 3.7 4.9  CL 97 99  CO2 24 26  BUN 41* 39*  CREATININE 1.47* 1.43*  CALCIUM 9.4 9.1  GLUCOSE 129* 285*    CBG (last 3)   Recent Labs  11/12/13 0139 11/12/13 0646  GLUCAP 266* 250*   Lab Results  Component Value Date   HGBA1C 6.8* 05/29/2013   Scheduled Meds: . atorvastatin  10 mg Oral q1800  . cholecalciferol  1,000 Units Oral Daily  . diltiazem  180 mg Oral Daily  . feeding supplement (GLUCERNA SHAKE)  237 mL Oral TID PC  . insulin aspart  0-9 Units Subcutaneous TID WC  . insulin glargine  20 Units Subcutaneous QHS  . latanoprost  1 drop Both Eyes QHS  . metoprolol tartrate  25 mg Oral BID  . QUEtiapine  12.5 mg Oral QHS  . sertraline  50 mg Oral Daily  . timolol  1  drop Both Eyes Daily    Continuous Infusions: . sodium chloride 20 mL/hr at 11/12/13 0246    Past Medical History  Diagnosis Date  . Hypertension   . Depression   . Erosive esophagitis 04/02/2012  . Mallory - Weiss tear 04/02/2012  . Dysrhythmia 02/24/2013    ATRIAL FIB WITH RVR  . Arthritis   . Atrial fibrillation   . CHF (congestive heart failure)   . Diabetes mellitus     Past Surgical History  Procedure Laterality Date  . Abdominal hysterectomy    . Appendectomy    . Cholecystectomy    . Esophagogastroduodenoscopy  03/31/2012    Procedure: ESOPHAGOGASTRODUODENOSCOPY (EGD);  Surgeon: Meryl Dare, MD,FACG;  Location: [facility];  Service: Endoscopy;  Laterality: N/A;    Kendell Bane RD, LDN, CNSC [phone] Pager [phone] After Hours Pager

## 2013-11-12 NOTE — Progress Notes (Signed)
PHARMACIST - PHYSICIAN COMMUNICATION DR:   CONCERNING:    79yo female takes Seroquel 6.25mg  at home, reordered as inpatient.  FYI, per policy we cannot quarter tabs and no lower strength tablet is available, so current dose is entered as 12.5mg .  Please verify that this is appropriate or change to another agent.  Thank you.

## 2013-11-12 NOTE — Consult Note (Signed)
ORTHOPAEDIC CONSULTATION  REQUESTING PHYSICIAN: Edsel Petrin, DO  Chief Complaint: Right hip pain.  HPI: Morgan Morris is a 79 y.o. female who complains of right hip pain s/p mechanical fall at facility while transferring from chair.  Denies LOC, abd pain.  Denies prior hip pain to fall.  Past Medical History  Diagnosis Date  . Hypertension   . Depression   . Erosive esophagitis 04/02/2012  . Mallory - Weiss tear 04/02/2012  . Dysrhythmia 02/24/2013    ATRIAL FIB WITH RVR  . Arthritis   . Atrial fibrillation   . CHF (congestive heart failure)   . Diabetes mellitus    Past Surgical History  Procedure Laterality Date  . Abdominal hysterectomy    . Appendectomy    . Cholecystectomy    . Esophagogastroduodenoscopy  03/31/2012    Procedure: ESOPHAGOGASTRODUODENOSCOPY (EGD);  Surgeon: Meryl Dare, MD,FACG;  Location: [facility];  Service: Endoscopy;  Laterality: N/A;   History   Social History  . Marital Status: Single    Spouse Name: N/A    Number of Children: N/A  . Years of Education: N/A   Social History Main Topics  . Smoking status: Never Smoker   . Smokeless tobacco: Never Used  . Alcohol Use: No  . Drug Use: No  . Sexual Activity: No   Other Topics Concern  . None   Social History Narrative  . None   Family History  Problem Relation Age of Onset  . Cancer Mother    Allergies  Allergen Reactions  . Penicillins     unknown  . Sulfa Antibiotics     unknown   Prior to Admission medications   Medication Sig Start Date End Date Taking? Authorizing Provider  albuterol (PROVENTIL HFA;VENTOLIN HFA) 108 (90 BASE) MCG/ACT inhaler Inhale 2 puffs into the lungs every 6 (six) hours as needed for wheezing. 02/27/13  Yes Richarda Overlie, MD  albuterol (PROVENTIL) (2.5 MG/3ML) 0.083% nebulizer solution Take 2.5 mg by nebulization every 6 (six) hours as needed for wheezing.   Yes Historical Provider, MD  apixaban (ELIQUIS) 2.5 MG TABS tablet Take 1 tablet (2.5 mg  total) by mouth 2 (two) times daily. 02/27/13  Yes Richarda Overlie, MD  b complex vitamins tablet Take 1 tablet by mouth daily at 12 noon.   Yes Historical Provider, MD  cholecalciferol (VITAMIN D) 1000 UNITS tablet Take 1,000 Units by mouth daily.   Yes Historical Provider, MD  diltiazem (CARDIZEM CD) 180 MG 24 hr capsule Take 1 capsule (180 mg total) by mouth daily. 06/02/13  Yes Elease Etienne, MD  furosemide (LASIX) 20 MG tablet Take 40 mg by mouth daily.   Yes Historical Provider, MD  insulin glargine (LANTUS) 100 UNIT/ML injection Inject 0.2 mLs (20 Units total) into the skin at bedtime. 08/16/13  Yes Clydia Llano, MD  insulin regular (NOVOLIN R,HUMULIN R) 100 units/mL injection Inject 3-20 Units into the skin 3 (three) times daily before meals. Sliding scale. See nursing home MAR. 121-150 = 3 units 151-200=5 units 201-250= 12 units 251-300= 17 units >300=20 units   Yes Historical Provider, MD  latanoprost (XALATAN) 0.005 % ophthalmic solution Place 1 drop into both eyes at bedtime.   Yes Historical Provider, MD  metoprolol tartrate (LOPRESSOR) 25 MG tablet Take 1 tablet (25 mg total) by mouth 2 (two) times daily. 08/17/13  Yes Clydia Llano, MD  Multiple Vitamins-Minerals (OCUVITE PRESERVISION PO) Take 1 tablet by mouth 2 (two) times daily.   Yes Historical  Provider, MD  Nutritional Supplements (BOOST DIABETIC) LIQD Take 1 Bottle by mouth 3 (three) times daily after meals. 08/17/13  Yes Clydia Llano, MD  potassium chloride SA (K-DUR,KLOR-CON) 20 MEQ tablet Take 2 tablets (40 mEq total) by mouth daily. 04/01/13  Yes Zannie Cove, MD  pravastatin (PRAVACHOL) 40 MG tablet Take 40 mg by mouth daily.   Yes Historical Provider, MD  QUEtiapine (SEROQUEL) 25 MG tablet Take 6.25 mg by mouth at bedtime.   Yes Historical Provider, MD  sertraline (ZOLOFT) 50 MG tablet Take 50 mg by mouth daily.    Yes Historical Provider, MD  spironolactone (ALDACTONE) 25 MG tablet Take 12.5 mg by mouth every morning.   Yes  Historical Provider, MD  timolol (TIMOPTIC) 0.5 % ophthalmic solution Place 1 drop into both eyes daily.   Yes Historical Provider, MD   Dg Chest 2 View  11/11/2013   CLINICAL DATA:  Fall.  EXAM: CHEST  2 VIEW  COMPARISON:  09/02/2013  FINDINGS: The lungs are adequately inflated without definite consolidation or effusion. There is mild stable cardiomegaly. Remainder of the exam is unchanged.  IMPRESSION: No definite acute cardiopulmonary disease.  Stable cardiomegaly.   Electronically Signed   By: Elberta Fortis M.D.   On: 11/11/2013 20:56   Dg Hip 1 View Right  11/11/2013   CLINICAL DATA:  Fall.  Right hip pain.  EXAM: RIGHT HIP - 1 VIEW  COMPARISON:  03/29/2012  FINDINGS: Acute subcapital right femoral neck fracture noted with varus angulation and moderate displacement.  IMPRESSION: 1. Acute right femoral neck fracture.   Electronically Signed   By: Herbie Baltimore M.D.   On: 11/11/2013 20:20   Dg Femur Right  11/11/2013   CLINICAL DATA:  Right hip fracture  EXAM: RIGHT FEMUR - 2 VIEW  COMPARISON:  Prior radiograph of the right hip performed earlier on the same day.  FINDINGS: Again seen is an acute subcapital right femoral neck fracture, stable in position and alignment relative to prior radiograph. No other fracture or dislocation seen involving the right femur. The right knee is grossly approximated. No soft tissue abnormality.  IMPRESSION: Unchanged right femoral neck fracture. No other acute fracture involving the right femur.   Electronically Signed   By: Rise Mu M.D.   On: 11/11/2013 23:06    Positive ROS: All other systems have been reviewed and were otherwise negative with the exception of those mentioned in the HPI and as above.  Physical Exam: General: Alert, no acute distress Cardiovascular: No pedal edema Respiratory: No cyanosis, no use of accessory musculature GI: No organomegaly, abdomen is soft and non-tender Skin: No lesions in the area of chief  complaint Neurologic: Sensation intact distally Psychiatric: Patient is competent for consent with normal mood and affect Lymphatic: No axillary or cervical lymphadenopathy  MUSCULOSKELETAL: RLE: skin intact, foot wwp nvi, limited ROM of hip due to pain  Assessment: Right femoral neck fracture  Plan: 1. Continue NPO 2. Plan for surgery this pm 3. Call with questions.  Thank you for the consult and the opportunity to see Morgan Morris  N. Glee Arvin, MD [facility] [phone] 8:20 AM

## 2013-11-13 ENCOUNTER — Inpatient Hospital Stay (HOSPITAL_COMMUNITY): Payer: Medicare Other

## 2013-11-13 LAB — CBC
HCT: 28.3 % — ABNORMAL LOW (ref 36.0–46.0)
Hemoglobin: 9.8 g/dL — ABNORMAL LOW (ref 12.0–15.0)
MCHC: 34.6 g/dL (ref 30.0–36.0)
Platelets: 136 10*3/uL — ABNORMAL LOW (ref 150–400)
RBC: 3.11 MIL/uL — ABNORMAL LOW (ref 3.87–5.11)
WBC: 16.3 10*3/uL — ABNORMAL HIGH (ref 4.0–10.5)

## 2013-11-13 LAB — BASIC METABOLIC PANEL
CO2: 21 mEq/L (ref 19–32)
Chloride: 101 mEq/L (ref 96–112)
GFR calc non Af Amer: 29 mL/min — ABNORMAL LOW (ref >90)
Glucose, Bld: 299 mg/dL — ABNORMAL HIGH (ref 70–99)
Potassium: 4.7 mEq/L (ref 3.5–5.1)
Sodium: 136 mEq/L (ref 135–145)

## 2013-11-13 LAB — GLUCOSE, CAPILLARY
Glucose-Capillary: 178 mg/dL — ABNORMAL HIGH (ref 70–99)
Glucose-Capillary: 316 mg/dL — ABNORMAL HIGH (ref 70–99)

## 2013-11-13 MED ORDER — SODIUM CHLORIDE 0.9 % IR SOLN
Status: DC | PRN
  Administered 2013-11-13: 1000 mL

## 2013-11-13 MED ORDER — PHENYLEPHRINE HCL 10 MG/ML IJ SOLN
INTRAMUSCULAR | Status: DC | PRN
  Administered 2013-11-12: 80 ug via INTRAVENOUS

## 2013-11-13 MED ORDER — DEXTROSE 5 % IV SOLN: 500.0000 mg | Freq: Four times a day (QID) | INTRAVENOUS | Status: DC | PRN

## 2013-11-13 MED ORDER — APIXABAN 2.5 MG PO TABS
2.5000 mg | ORAL_TABLET | Freq: Two times a day (BID) | ORAL | Status: DC
  Administered 2013-11-13 – 2013-11-17 (×7): 2.5 mg via ORAL
  Filled 2013-11-13 (×9): qty 1

## 2013-11-13 MED ORDER — SUFENTANIL CITRATE 50 MCG/ML IV SOLN
INTRAVENOUS | Status: DC | PRN
  Administered 2013-11-12 (×2): 10 ug via INTRAVENOUS

## 2013-11-13 MED ORDER — SODIUM CHLORIDE 0.9 % IV SOLN
INTRAVENOUS | Status: DC | PRN
  Administered 2013-11-13: 01:00:00 via INTRAVENOUS

## 2013-11-13 MED ORDER — ALUM & MAG HYDROXIDE-SIMETH 200-200-20 MG/5ML PO SUSP: 30.0000 mL | ORAL | Status: DC | PRN

## 2013-11-13 MED ORDER — ACETAMINOPHEN 325 MG PO TABS
650.0000 mg | ORAL_TABLET | Freq: Four times a day (QID) | ORAL | Status: DC | PRN
  Administered 2013-11-13: 650 mg via ORAL
  Filled 2013-11-13: qty 2

## 2013-11-13 MED ORDER — EPHEDRINE SULFATE 50 MG/ML IJ SOLN
INTRAMUSCULAR | Status: DC | PRN
  Administered 2013-11-12 – 2013-11-13 (×2): 10 mg via INTRAVENOUS

## 2013-11-13 MED ORDER — SENNA 8.6 MG PO TABS
1.0000 | ORAL_TABLET | Freq: Two times a day (BID) | ORAL | Status: DC
  Administered 2013-11-13 – 2013-11-17 (×8): 8.6 mg via ORAL
  Filled 2013-11-13 (×10): qty 1

## 2013-11-13 MED ORDER — SUCCINYLCHOLINE CHLORIDE 20 MG/ML IJ SOLN
INTRAMUSCULAR | Status: DC | PRN
  Administered 2013-11-12: 100 mg via INTRAVENOUS

## 2013-11-13 MED ORDER — LIDOCAINE HCL (CARDIAC) 20 MG/ML IV SOLN
INTRAVENOUS | Status: DC | PRN
  Administered 2013-11-12: 80 mg via INTRAVENOUS

## 2013-11-13 MED ORDER — ONDANSETRON HCL 4 MG/2ML IJ SOLN: 4.0000 mg | Freq: Once | INTRAMUSCULAR | Status: AC | PRN

## 2013-11-13 MED ORDER — APIXABAN 2.5 MG PO TABS: 2.5000 mg | ORAL_TABLET | Freq: Two times a day (BID) | ORAL | Status: DC

## 2013-11-13 MED ORDER — OXYCODONE HCL 5 MG PO TABS
5.0000 mg | ORAL_TABLET | ORAL | Status: DC | PRN
  Administered 2013-11-16: 5 mg via ORAL
  Administered 2013-11-17: 10 mg via ORAL
  Filled 2013-11-13: qty 1
  Filled 2013-11-13: qty 2

## 2013-11-13 MED ORDER — ACETAMINOPHEN 650 MG RE SUPP: 650.0000 mg | Freq: Four times a day (QID) | RECTAL | Status: DC | PRN

## 2013-11-13 MED ORDER — METOCLOPRAMIDE HCL 5 MG/ML IJ SOLN: 5.0000 mg | Freq: Three times a day (TID) | INTRAMUSCULAR | Status: DC | PRN

## 2013-11-13 MED ORDER — MENTHOL 3 MG MT LOZG: 1.0000 | LOZENGE | OROMUCOSAL | Status: DC | PRN

## 2013-11-13 MED ORDER — ONDANSETRON HCL 4 MG PO TABS: 4.0000 mg | ORAL_TABLET | Freq: Four times a day (QID) | ORAL | Status: DC | PRN

## 2013-11-13 MED ORDER — GLYCOPYRROLATE 0.2 MG/ML IJ SOLN
INTRAMUSCULAR | Status: DC | PRN
  Administered 2013-11-13: 0.4 mg via INTRAVENOUS

## 2013-11-13 MED ORDER — ROCURONIUM BROMIDE 100 MG/10ML IV SOLN
INTRAVENOUS | Status: DC | PRN
  Administered 2013-11-12: 20 mg via INTRAVENOUS

## 2013-11-13 MED ORDER — ONDANSETRON HCL 4 MG/2ML IJ SOLN
INTRAMUSCULAR | Status: DC | PRN
  Administered 2013-11-13: 4 mg via INTRAVENOUS

## 2013-11-13 MED ORDER — PHENOL 1.4 % MT LIQD: 1.0000 | OROMUCOSAL | Status: DC | PRN

## 2013-11-13 MED ORDER — CLINDAMYCIN PHOSPHATE 600 MG/50ML IV SOLN
600.0000 mg | Freq: Four times a day (QID) | INTRAVENOUS | Status: DC
  Administered 2013-11-13: 600 mg via INTRAVENOUS
  Filled 2013-11-13 (×2): qty 50

## 2013-11-13 MED ORDER — HYDROCODONE-ACETAMINOPHEN 5-325 MG PO TABS
1.0000 | ORAL_TABLET | Freq: Four times a day (QID) | ORAL | Status: DC | PRN
  Administered 2013-11-13 – 2013-11-15 (×3): 1 via ORAL
  Filled 2013-11-13 (×3): qty 1

## 2013-11-13 MED ORDER — OXYCODONE HCL 5 MG PO TABS: 5.0000 mg | ORAL_TABLET | ORAL | Status: DC | PRN

## 2013-11-13 MED ORDER — PROPOFOL 10 MG/ML IV BOLUS
INTRAVENOUS | Status: DC | PRN
  Administered 2013-11-12: 120 mg via INTRAVENOUS

## 2013-11-13 MED ORDER — FUROSEMIDE 20 MG PO TABS
20.0000 mg | ORAL_TABLET | Freq: Every day | ORAL | Status: DC
  Administered 2013-11-13 – 2013-11-17 (×5): 20 mg via ORAL
  Filled 2013-11-13 (×6): qty 1

## 2013-11-13 MED ORDER — FENTANYL CITRATE 0.05 MG/ML IJ SOLN: 25.0000 ug | INTRAMUSCULAR | Status: DC | PRN

## 2013-11-13 MED ORDER — ONDANSETRON HCL 4 MG/2ML IJ SOLN: 4.0000 mg | Freq: Four times a day (QID) | INTRAMUSCULAR | Status: DC | PRN

## 2013-11-13 MED ORDER — METOCLOPRAMIDE HCL 10 MG PO TABS: 5.0000 mg | ORAL_TABLET | Freq: Three times a day (TID) | ORAL | Status: DC | PRN

## 2013-11-13 MED ORDER — METHOCARBAMOL 500 MG PO TABS
500.0000 mg | ORAL_TABLET | Freq: Four times a day (QID) | ORAL | Status: DC | PRN
  Administered 2013-11-13 – 2013-11-16 (×2): 500 mg via ORAL
  Filled 2013-11-13 (×2): qty 1

## 2013-11-13 MED ORDER — NEOSTIGMINE METHYLSULFATE 1 MG/ML IJ SOLN
INTRAMUSCULAR | Status: DC | PRN
  Administered 2013-11-13: 3 mg via INTRAVENOUS

## 2013-11-13 MED ORDER — SODIUM CHLORIDE 0.9 % IV SOLN
INTRAVENOUS | Status: DC
  Administered 2013-11-13 – 2013-11-16 (×5): via INTRAVENOUS

## 2013-11-13 MED ORDER — MORPHINE SULFATE 2 MG/ML IJ SOLN: 0.5000 mg | INTRAMUSCULAR | Status: DC | PRN

## 2013-11-13 NOTE — Transfer of Care (Signed)
Immediate Anesthesia Transfer of Care Note  Patient: Morgan Morris  Procedure(s) Performed: Procedure(s): RIGHT HIP HEMIARTHROPLASTY (Right)  Patient Location: PACU  Anesthesia Type:General  Level of Consciousness: sedated, patient cooperative and responds to stimulation  Airway & Oxygen Therapy: Patient Spontanous Breathing and Patient connected to nasal cannula oxygen  Post-op Assessment: Report given to PACU RN, Post -op Vital signs reviewed and stable and Patient moving all extremities X 4  Post vital signs: Reviewed and stable  Complications: No apparent anesthesia complications

## 2013-11-13 NOTE — Progress Notes (Signed)
ANTICOAGULATION CONSULT NOTE - Initial Consult  Pharmacy Consult for Eliquis Indication: atrial fibrillation  Allergies  Allergen Reactions  . Penicillins     unknown  . Sulfa Antibiotics     unknown    Patient Measurements: Height: 5\' 2"  (157.5 cm) Weight: 148 lb (67.132 kg) (bed scale) IBW/kg (Calculated) : 50.1  Vital Signs: Temp: 98.1 F (36.7 C) (11/22 0338) Temp src: Oral (11/22 0338) BP: 122/46 mmHg (11/22 0338) Pulse Rate: 61 (11/22 0338)  Labs:  Recent Labs  11/11/13 2135 11/12/13 0551  HGB 11.4* 12.1  HCT 31.9* 34.5*  PLT 150 156  LABPROT 14.8  --   INR 1.19  --   CREATININE 1.47* 1.43*    Estimated Creatinine Clearance: 23 ml/min (by C-G formula based on Cr of 1.43).   Medical History: Past Medical History  Diagnosis Date  . Hypertension   . Depression   . Erosive esophagitis 04/02/2012  . Mallory - Weiss tear 04/02/2012  . Dysrhythmia 02/24/2013    ATRIAL FIB WITH RVR  . Arthritis   . Atrial fibrillation   . CHF (congestive heart failure)   . Diabetes mellitus     Medications:  Prescriptions prior to admission  Medication Sig Dispense Refill  . albuterol (PROVENTIL HFA;VENTOLIN HFA) 108 (90 BASE) MCG/ACT inhaler Inhale 2 puffs into the lungs every 6 (six) hours as needed for wheezing.  1 Inhaler  2  . albuterol (PROVENTIL) (2.5 MG/3ML) 0.083% nebulizer solution Take 2.5 mg by nebulization every 6 (six) hours as needed for wheezing.      Marland Kitchen apixaban (ELIQUIS) 2.5 MG TABS tablet Take 1 tablet (2.5 mg total) by mouth 2 (two) times daily.  20 tablet  5  . b complex vitamins tablet Take 1 tablet by mouth daily at 12 noon.      . cholecalciferol (VITAMIN D) 1000 UNITS tablet Take 1,000 Units by mouth daily.      Marland Kitchen diltiazem (CARDIZEM CD) 180 MG 24 hr capsule Take 1 capsule (180 mg total) by mouth daily.  30 capsule  0  . furosemide (LASIX) 20 MG tablet Take 40 mg by mouth daily.      . insulin glargine (LANTUS) 100 UNIT/ML injection Inject 0.2 mLs  (20 Units total) into the skin at bedtime.      . insulin regular (NOVOLIN R,HUMULIN R) 100 units/mL injection Inject 3-20 Units into the skin 3 (three) times daily before meals. Sliding scale. See nursing home MAR. 121-150 = 3 units 151-200=5 units 201-250= 12 units 251-300= 17 units >300=20 units      . latanoprost (XALATAN) 0.005 % ophthalmic solution Place 1 drop into both eyes at bedtime.      . metoprolol tartrate (LOPRESSOR) 25 MG tablet Take 1 tablet (25 mg total) by mouth 2 (two) times daily.  60 tablet  0  . Multiple Vitamins-Minerals (OCUVITE PRESERVISION PO) Take 1 tablet by mouth 2 (two) times daily.      . Nutritional Supplements (BOOST DIABETIC) LIQD Take 1 Bottle by mouth 3 (three) times daily after meals.  10 mL  0  . potassium chloride SA (K-DUR,KLOR-CON) 20 MEQ tablet Take 2 tablets (40 mEq total) by mouth daily.      . pravastatin (PRAVACHOL) 40 MG tablet Take 40 mg by mouth daily.      . QUEtiapine (SEROQUEL) 25 MG tablet Take 6.25 mg by mouth at bedtime.      . sertraline (ZOLOFT) 50 MG tablet Take 50 mg by mouth daily.       Marland Kitchen  spironolactone (ALDACTONE) 25 MG tablet Take 12.5 mg by mouth every morning.      . timolol (TIMOPTIC) 0.5 % ophthalmic solution Place 1 drop into both eyes daily.       Scheduled:  . atorvastatin  10 mg Oral q1800  . cefTRIAXone (ROCEPHIN)  IV  1 g Intravenous Q24H  . Chlorhexidine Gluconate Cloth  6 each Topical Q0600  . cholecalciferol  1,000 Units Oral Daily  . clindamycin (CLEOCIN) IV  600 mg Intravenous Q6H  . diltiazem  180 mg Oral Daily  . feeding supplement (GLUCERNA SHAKE)  237 mL Oral TID PC  . insulin aspart  0-9 Units Subcutaneous TID WC  . insulin glargine  20 Units Subcutaneous QHS  . latanoprost  1 drop Both Eyes QHS  . metoprolol tartrate  25 mg Oral BID  . mupirocin ointment   Nasal BID  . QUEtiapine  12.5 mg Oral QHS  . senna  1 tablet Oral BID  . sertraline  50 mg Oral Daily  . timolol  1 drop Both Eyes Daily    Infusions:  . sodium chloride 20 mL/hr at 11/12/13 0246  . sodium chloride      Assessment: 79yo female admitted 11/20 s/p fall w/ right hip fracture, now s/p open surgical treatment, okayed by surgeon to resume Eliquis for Afib tonight.  Plan:  Will resume home Eliquis dose of 2.5mg  PO BID and monitor.  Vernard Gambles, PharmD, BCPS  11/13/2013,4:36 AM

## 2013-11-13 NOTE — Progress Notes (Signed)
[facility]                                                                                Patient Demographics  Morgan Morris, is a 79 y.o. female, DOB - 31-May-1920, ZOX:[id number]  Admit date - 11/11/2013   Admitting Physician Morgan Clos, MD  Outpatient Primary MD for the patient is Morgan Patch, MD  LOS - 2   Chief Complaint  Patient presents with  . Fall        Assessment & Plan    Principal Problem:   Femoral neck fracture Active Problems:   Atrial fibrillation   CKD (chronic kidney disease), stage III   Diabetes mellitus  Right hip fracture status post mechanical fall  -POD 1.  Ortho following. Continue pain control. -Patient will likely need rehabilitation.    Atrial fibrillation  -Currently rate controlled with Lopressor and diltiazem.   -Eliquis restarted today.    Leukocytosis secondary to urinary tract infection -UA showed TNTC WBC and large leukocytes -Continue ceftriaxone -Pending urine culture  History of CHF -Currently euvolemic  -Will restart her home dose of Lasix. Will monitor her I/Os and daily weights. -Will place on fluid restriction.  -Will continue home medications.  Chronic kidney disease  -Creatinine appears to be at baseline will continue to monitor.  Diabetes mellitus type 2 -Will continue to complain scale and Lantus with CBGs.  Anemia  -Likely secondary to recent surgery -H/H 9.8/28.3 currently stable continue to monitor CBC   Depression -Continue Seroquel and Zoloft  Code Status: Full  Family Communication: Son at bedside  Disposition Plan: Admitted   Procedures  Open treatment of femoral fracture, proximal end, neck, prosthetic replacement  Consults   Orthopedics  DVT Prophylaxis  SCDs   Lab Results  Component Value Date   PLT 136* 11/13/2013    Medications  Scheduled Meds: . apixaban  2.5 mg Oral BID  . atorvastatin  10 mg Oral q1800  . cefTRIAXone (ROCEPHIN)  IV  1 g Intravenous Q24H   . Chlorhexidine Gluconate Cloth  6 each Topical Q0600  . cholecalciferol  1,000 Units Oral Daily  . clindamycin (CLEOCIN) IV  600 mg Intravenous Q6H  . diltiazem  180 mg Oral Daily  . feeding supplement (GLUCERNA SHAKE)  237 mL Oral TID PC  . insulin aspart  0-9 Units Subcutaneous TID WC  . insulin glargine  20 Units Subcutaneous QHS  . latanoprost  1 drop Both Eyes QHS  . metoprolol tartrate  25 mg Oral BID  . mupirocin ointment   Nasal BID  . QUEtiapine  12.5 mg Oral QHS  . senna  1 tablet Oral BID  . sertraline  50 mg Oral Daily  . timolol  1 drop Both Eyes Daily   Continuous Infusions: . sodium chloride 20 mL/hr at 11/12/13 0246  . sodium chloride 125 mL/hr at 11/13/13 0858   PRN Meds:.acetaminophen, acetaminophen, albuterol, albuterol, alum & mag hydroxide-simeth, fentaNYL, HYDROcodone-acetaminophen, menthol-cetylpyridinium, methocarbamol (ROBAXIN) IV, methocarbamol, metoCLOPramide (REGLAN) injection, metoCLOPramide, morphine injection, ondansetron (ZOFRAN) IV, ondansetron (ZOFRAN) IV, ondansetron, oxyCODONE, phenol  Antibiotics    Anti-infectives   Start     Dose/Rate Route Frequency Ordered Stop  11/13/13 0600  clindamycin (CLEOCIN) IVPB 600 mg     600 mg 100 mL/hr over 30 Minutes Intravenous Every 6 hours 11/13/13 0403 11/13/13 1759   11/12/13 2315  clindamycin (CLEOCIN) IVPB 600 mg     600 mg 100 mL/hr over 30 Minutes Intravenous  Once 11/12/13 2310 11/13/13 0005   11/12/13 1400  cefTRIAXone (ROCEPHIN) 1 g in dextrose 5 % 50 mL IVPB     1 g 100 mL/hr over 30 Minutes Intravenous Every 24 hours 11/12/13 1221        Time Spent in minutes   30 minutes   Morgan Morris D.O. on 11/13/2013 at 10:17 AM  Between 7am to 7pm - Pager - [phone]  After 7pm go to [email or web address] - password [email or web address]  And look for the night coverage person covering for me after hours  [facility] Office  [phone]    Subjective:   Morgan Morris seen and examined today.   Patient complains of right hip pain. She has no other complaints. Patient denies dizziness, chest pain, shortness of breath, abdominal pain, N/V/D/C, new weakness, numbess, tingling.    Objective:   Filed Vitals:   11/13/13 0315 11/13/13 0338 11/13/13 0615 11/13/13 0949  BP: 131/43 122/46 128/50 105/46  Pulse: 61 61 71 67  Temp: 98.6 F (37 C) 98.1 F (36.7 C) 98.4 F (36.9 C) 98.2 F (36.8 C)  TempSrc:  Oral Oral Oral  Resp: 13 18 18 18   Height:      Weight:      SpO2: 97% 98% 97% 100%    Wt Readings from Last 3 Encounters:  11/12/13 67.132 kg (148 lb)  11/12/13 67.132 kg (148 lb)  08/17/13 59.512 kg (131 lb 3.2 oz)     Intake/Output Summary (Last 24 hours) at 11/13/13 1017 Last data filed at 11/13/13 0616  Gross per 24 hour  Intake   1985 ml  Output   1255 ml  Net    730 ml    Exam  General: Well developed, well nourished, NAD, appears stated age  HEENT: NCAT, PERRLA, EOMI, Anicteic Sclera, mucous membranes moist. No pharyngeal erythema or exudates  Neck: Supple, no JVD, no masses  Cardiovascular: S1 S2 auscultated, no rubs, murmurs or gallops. Regular rate and rhythm.  Respiratory: Clear to auscultation bilaterally with equal chest rise  Abdomen: Soft, nontender, nondistended, + bowel sounds  Extremities: warm dry without cyanosis clubbing or edema  Neuro: AAOx3, cranial nerves grossly intact. Strength appropriate for age in the RUE, LLE, LUE.  RLE strength not assessed due to hip pain. Drain in place.  Skin: Without rashes exudates or nodules.  Incision clean.    Psych: Normal affect and demeanor, depressed mood  Data Review   Micro Results Recent Results (from the past 240 hour(s))  SURGICAL PCR SCREEN     Status: Abnormal   Collection Time    11/12/13  4:22 AM      Result Value Range Status   MRSA, PCR POSITIVE (*) NEGATIVE Final   Staphylococcus aureus POSITIVE (*) NEGATIVE Final   Comment:            The Xpert SA Assay (FDA     approved for  NASAL specimens     in patients over 34 years of age),     is one component of     a comprehensive surveillance     program.  Test performance has     been validated by [employer]  [facility] for patients greater     than or equal to 58 year old.     It is not intended     to diagnose infection nor to     guide or monitor treatment.    Radiology Reports Dg Chest 2 View  11/11/2013   CLINICAL DATA:  Fall.  EXAM: CHEST  2 VIEW  COMPARISON:  09/02/2013  FINDINGS: The lungs are adequately inflated without definite consolidation or effusion. There is mild stable cardiomegaly. Remainder of the exam is unchanged.  IMPRESSION: No definite acute cardiopulmonary disease.  Stable cardiomegaly.   Electronically Signed   By: Elberta Fortis M.D.   On: 11/11/2013 20:56   Dg Hip 1 View Right  11/11/2013   CLINICAL DATA:  Fall.  Right hip pain.  EXAM: RIGHT HIP - 1 VIEW  COMPARISON:  03/29/2012  FINDINGS: Acute subcapital right femoral neck fracture noted with varus angulation and moderate displacement.  IMPRESSION: 1. Acute right femoral neck fracture.   Electronically Signed   By: Herbie Baltimore M.D.   On: 11/11/2013 20:20   Dg Femur Right  11/11/2013   CLINICAL DATA:  Right hip fracture  EXAM: RIGHT FEMUR - 2 VIEW  COMPARISON:  Prior radiograph of the right hip performed earlier on the same day.  FINDINGS: Again seen is an acute subcapital right femoral neck fracture, stable in position and alignment relative to prior radiograph. No other fracture or dislocation seen involving the right femur. The right knee is grossly approximated. No soft tissue abnormality.  IMPRESSION: Unchanged right femoral neck fracture. No other acute fracture involving the right femur.   Electronically Signed   By: Rise Mu M.D.   On: 11/11/2013 23:06    CBC  Recent Labs Lab 11/11/13 2135 11/12/13 0551 11/13/13 0554  WBC 14.5* 14.6* 16.3*  HGB 11.4* 12.1 9.8*  HCT 31.9* 34.5* 28.3*  PLT 150 156 136*  MCV 88.6  90.3 91.0  MCH 31.7 31.7 31.5  MCHC 35.7 35.1 34.6  RDW 13.3 13.9 14.2  LYMPHSABS 0.8  --   --   MONOABS 1.0  --   --   EOSABS 0.5  --   --   BASOSABS 0.1  --   --     Chemistries   Recent Labs Lab 11/11/13 2135 11/12/13 0551 11/13/13 0554  NA 134* 136 136  K 3.7 4.9 4.7  CL 97 99 101  CO2 24 26 21   GLUCOSE 129* 285* 299*  BUN 41* 39* 34*  CREATININE 1.47* 1.43* 1.49*  CALCIUM 9.4 9.1 8.2*   ------------------------------------------------------------------------------------------------------------------ estimated creatinine clearance is 22.1 ml/min (by C-G formula based on Cr of 1.49). ------------------------------------------------------------------------------------------------------------------ No results found for this basename: HGBA1C,  in the last 72 hours ------------------------------------------------------------------------------------------------------------------ No results found for this basename: CHOL, HDL, LDLCALC, TRIG, CHOLHDL, LDLDIRECT,  in the last 72 hours ------------------------------------------------------------------------------------------------------------------ No results found for this basename: TSH, T4TOTAL, FREET3, T3FREE, THYROIDAB,  in the last 72 hours ------------------------------------------------------------------------------------------------------------------ No results found for this basename: VITAMINB12, FOLATE, FERRITIN, TIBC, IRON, RETICCTPCT,  in the last 72 hours  Coagulation profile  Recent Labs Lab 11/11/13 2135  INR 1.19    No results found for this basename: DDIMER,  in the last 72 hours  Cardiac Enzymes No results found for this basename: CK, CKMB, TROPONINI, MYOGLOBIN,  in the last 168 hours ------------------------------------------------------------------------------------------------------------------ No components found with this basename: POCBNP,

## 2013-11-13 NOTE — Preoperative (Signed)
Beta Blockers   Reason not to administer Beta Blockers:Hold beta blocker due to other  AM dose given  2200hr dose held  Pt in surgery had hypotension

## 2013-11-13 NOTE — Care Management Note (Unsigned)
    Page 1 of 1   11/13/2013     1:29:52 PM   CARE MANAGEMENT NOTE 11/13/2013  Patient:  Morgan Morris, Morgan Morris   Account Number:  [id number]  Date Initiated:  11/12/2013  Documentation initiated by:  [facility]  Subjective/Objective Assessment:   fall at ALF, Right femoral neck fracture, scheduled surgery 11/12/2013     Action/Plan:   [facility]  ALF  awaiting pt eval   Anticipated DC Date:     Anticipated DC Plan:  SKILLED NURSING FACILITY  In-house referral  Clinical Social Worker      DC Planning Services  CM consult      Choice offered to / List presented to:             Status of service:  In process, will continue to follow Medicare Important Message given?   (If response is "NO", the following Medicare IM given date fields will be blank) Date Medicare IM given:   Date Additional Medicare IM given:    Discharge Disposition:    Per UR Regulation:    If discussed at Long Length of Stay Meetings, dates discussed:    Comments:  11/13/13 13:26 Letha Cape RN, BSN [phone] patient is s/p surgery for hip fx, awaiting pt eval.  NCM will continue to follow for dc needs.

## 2013-11-13 NOTE — Op Note (Signed)
RIGHT HIP HEMIARTHROPLASTY  KAIDYN HERNANDES   [id number]  Pre-op Diagnosis: Right femoral neck fracture     Post-op Diagnosis: same   Operative Procedures  1. Open treatment of femoral fracture, proximal end, neck, prosthetic replacement CPT [phone]  Personnel  Surgeon(s): Patrecia Veiga Glee Arvin, MD   Anesthesia: spinal, epidural   Prosthesis: [employer] Synergy Femur: 12 HO Head: 43 size: -3 Bearing Type: Bipolar  Date of Service: 11/11/2013 - 11/13/2013  Indication: 79 y.o.. year old female who suffered a ground level fall and was found to have sustained a Right hip fracture. Prior to the fall she had no hip or groin pain. She was found to have a hip fracture that was appropriate for operative management. We reviewed the risk and benefits with the patient and family and they elected to proceed.  Procedure:  After informed consent was obtained and understanding of the risk were voiced including but not limited to bleeding, infection, damage to surrounding structures including nerves and vessels, blood clots, leg length inequality, dislocation and the failure to achieve desired results, including death, the operative extremity was marked with verbal confirmation of the patient in the holding area.   The patient was then brought to the operating room and transported to the operating room table and placed in the lateral decubitus position. The operative limb was then prepped and draped in the usual sterile fashion and preoperative antibiotics were administered.  A time out was performed prior to the start of surgery confirming the correct extremity, preoperative antibiotic administration, as well as team members, and that implants and instruments available for the case. Correct surgical site was also confirmed with preoperative radiographs. A standard posterior approach to the hip was performed. A capsulotomy was performed and the capsule tagged for later repair. The labrum was preserved.  The hip was dislocated and the femoral neck cut was made at the length determined by preoperative templating below the level of the fracture. We then used a corkscrew and cobb to remove the femoral head from the acetabulum. We measured the head and proceeded to trial head sizes and found 43 mm to the the appropriate size. We then turned our attention to femoral preparation. We began sequential broaching to a size 12 stem. This size produced good fit and rotational stability. We placed the trial neck and a -3 mm bipolar head. Measurements from center head to lesser trochanter were obtained and compared to preoperative template, and leg lengths were evaluated on the table. The hip was stable in extension and external rotation without impingement. The hip was stable in deep flexion. In 90 degrees of flexion and neutral abduction the hip was stable to 60 degrees of internal rotation. In a position of sleep with hip adducted across the body the hip was stable to 45 degrees of rotation.  We then turned back to the femur and tested the broach for stability and fit, and removed the trial broach. The final femoral implant was placed, and the trial head was again trialed for stability. We then irrigated and dried the trunion, and the final head was placed. We placed an -3 mm bipolar head. The hip was again reduced, and taken through a range of motion and found to be stable as above. The hip was thoroughly irrigated, and a posterior capsular repair was performed with #5 Ethibond. The wound was irrigated with normal saline. The deep fascia was closed with 0 PDS, 0 vicryl for the deep fat layer, and 2.0  Vicryl Plus for the subcutaneous tissue. The skin was closed with staples. A sterile dressing was applied. The patient was awakened and transported to the recovery room in stable condition. All sponge, needle, and instrument counts were correct at the end of the case.  Position: lateral decubitus   Complications: none.    Time Out: performed   Drains/Packing: 1 hemovac  Estimated blood loss: 350 cc  Returned to Recovery Room: in good condition.   Antibiotics: yes   Mechanical VTE (DVT) Prophylaxis: sequential compression devices, TED thigh-high  Chemical VTE (DVT) Prophylaxis: resume eliquis  Fluid Replacement  Crystalloid: see anesthesia record Blood: none  FFP: none   Specimens Removed: 1 to pathology   Sponge and Instrument Count Correct? yes   PACU: portable radiograph - low AP pelvis  Admission: inpatient status, start PT & OT POD#1  Plan/RTC: Return in 2 weeks for wound check.  Weight Bearing/Load Lower Extremity: full  Posterior hip precautions  N. Glee Arvin, MD [facility] [phone] 1:52 AM

## 2013-11-13 NOTE — Evaluation (Signed)
Physical Therapy Evaluation Patient Details Name: Morgan Morris MRN: [id number] DOB: 09-10-1920 Today's Date: 11/13/2013 Time: 3086-5784 PT Time Calculation (min): 22 min  PT Assessment / Plan / Recommendation History of Present Illness  79 y.o.. year old female who suffered a ground level fall and was found to have sustained a Right hip fracture.  Right Hip Hemiarthroplasy on 11/12/13  Clinical Impression  Patient s/p fall and hip hemiarthroplasty.  Patient limited today by pain and anxiety.  Patient will benefit from PT to progress mobility and independence.  Patient will require increased assistance at discharge and most likely will need SNF before returning to ALF.      PT Assessment  Patient needs continued PT services    Follow Up Recommendations  SNF    Does the patient have the potential to tolerate intense rehabilitation      Barriers to Discharge        Equipment Recommendations  None recommended by PT    Recommendations for Other Services     Frequency Min 3X/week    Precautions / Restrictions Precautions Precautions: Fall;Posterior Hip Restrictions Weight Bearing Restrictions: Yes RLE Weight Bearing: Weight bearing as tolerated   Pertinent Vitals/Pain Patient unable to rate, but complained of pain throughout treatment in right leg.  Patient was pre-medicated earlier in day.      Mobility  Bed Mobility Bed Mobility: Supine to Sit Supine to Sit: 1: +2 Total assist Supine to Sit: Patient Percentage: 20% Transfers Transfers: Sit to Stand;Stand to Sit Sit to Stand: 1: +2 Total assist;From bed;With upper extremity assist Sit to Stand: Patient Percentage: 40% Stand to Sit: 1: +2 Total assist;With upper extremity assist;To chair/3-in-1 Stand to Sit: Patient Percentage: 40% Ambulation/Gait Ambulation/Gait Assistance: 1: +2 Total assist Ambulation/Gait: Patient Percentage: 40% Ambulation Distance (Feet): 1 Feet Assistive device: Rolling  walker Ambulation/Gait Assistance Details: patient only able to take 3-4 steps with +2 total assist.   Gait Pattern: Step-to pattern;Decreased stance time - right    Exercises     PT Diagnosis: Acute pain;Difficulty walking  PT Problem List: Decreased strength;Decreased range of motion;Decreased activity tolerance;Decreased mobility;Decreased knowledge of use of DME;Decreased knowledge of precautions;Pain PT Treatment Interventions: DME instruction;Gait training;Functional mobility training;Therapeutic activities;Therapeutic exercise;Patient/family education     PT Goals(Current goals can be found in the care plan section) Acute Rehab PT Goals Patient Stated Goal: not hurt PT Goal Formulation: With patient Time For Goal Achievement: 11/27/13 Potential to Achieve Goals: Good  Visit Information  Last PT Received On: 11/13/13 Assistance Needed: +2 History of Present Illness: 79 y.o.. year old female who suffered a ground level fall and was found to have sustained a Right hip fracture.  Right Hip Hemiarthroplasy on 11/12/13       Prior Functioning  Home Living Family/patient expects to be discharged to:: Skilled nursing facility Additional Comments: pt from assisted living.  h/o dementia.  STM deficits.   Prior Function Level of Independence: Needs assistance Gait / Transfers Assistance Needed: independent with RW Comments: used RW; lived at Assisted living Communication Communication: HOH Dominant Hand: Right    Cognition  Cognition Arousal/Alertness: Awake/alert Behavior During Therapy: WFL for tasks assessed/performed Overall Cognitive Status: History of cognitive impairments - at baseline Memory: Decreased short-term memory;Decreased recall of precautions    Extremity/Trunk Assessment Upper Extremity Assessment Upper Extremity Assessment: Defer to OT evaluation Lower Extremity Assessment Lower Extremity Assessment: RLE deficits/detail RLE: Unable to fully assess due to  pain   Balance Balance Balance Assessed: No  End  of Session PT - End of Session Equipment Utilized During Treatment: Gait belt Activity Tolerance: Patient limited by pain Patient left: in chair;with family/visitor present;with call bell/phone within reach Nurse Communication: Mobility status  GP     Olivia Canter, [place] [phone] 11/13/2013, 2:11 PM

## 2013-11-13 NOTE — Progress Notes (Signed)
   Subjective:  Patient reports pain as marked.  No events  Objective:   VITALS:   Filed Vitals:   11/13/13 0315 11/13/13 0338 11/13/13 0615 11/13/13 0949  BP: 131/43 122/46 128/50 105/46  Pulse: 61 61 71 67  Temp: 98.6 F (37 C) 98.1 F (36.7 C) 98.4 F (36.9 C) 98.2 F (36.8 C)  TempSrc:  Oral Oral Oral  Resp: 13 18 18 18   Height:      Weight:      SpO2: 97% 98% 97% 100%    Neurologically intact Neurovascular intact Sensation intact distally Intact pulses distally Dorsiflexion/Plantar flexion intact Incision: dressing C/D/I and no drainage No cellulitis present Compartment soft   Lab Results  Component Value Date   WBC 16.3* 11/13/2013   HGB 9.8* 11/13/2013   HCT 28.3* 11/13/2013   MCV 91.0 11/13/2013   PLT 136* 11/13/2013     Assessment/Plan: 1 Day Post-Op   Problem List Items Addressed This Visit     Cardiovascular and Mediastinum   Atrial fibrillation   Relevant Medications      metoprolol tartrate (LOPRESSOR) tablet 25 mg      diltiazem (CARDIZEM CD) 24 hr capsule 180 mg      atorvastatin (LIPITOR) tablet 10 mg      apixaban (ELIQUIS) tablet      apixaban (ELIQUIS) tablet 2.5 mg (Start on 11/13/2013 10:00 PM)   Acute diastolic CHF (congestive heart failure)     Endocrine   Diabetes mellitus   Relevant Medications      insulin glargine (LANTUS) injection 20 Units      insulin aspart (novoLOG) injection 0-9 Units     Musculoskeletal and Integument   *Femoral neck fracture     Genitourinary   CKD (chronic kidney disease), stage III   UTI (lower urinary tract infection)   Relevant Medications      mupirocin ointment (BACTROBAN) 2 %      clindamycin (CLEOCIN) IVPB 600 mg (Completed)      clindamycin (CLEOCIN) IVPB 600 mg     Other   Anemia    Other Visit Diagnoses   Femur fracture, right, closed, initial encounter    -  Primary    Relevant Orders       Weight bearing as tolerated       Advance diet Up with therapy Foley out this  am DVT ppx - resume eliquis per pharmacy dosing Pain control Posterior hip precautions x 3 months and no active hip abduction allowed x 6 weeks   Cheral Almas 11/13/2013, 10:07 AM [phone]

## 2013-11-14 ENCOUNTER — Inpatient Hospital Stay (HOSPITAL_COMMUNITY): Payer: Medicare Other

## 2013-11-14 DIAGNOSIS — Z8659 Personal history of other mental and behavioral disorders: Secondary | ICD-10-CM

## 2013-11-14 LAB — URINALYSIS, ROUTINE W REFLEX MICROSCOPIC
Bilirubin Urine: NEGATIVE
Glucose, UA: 500 mg/dL — AB
Hgb urine dipstick: NEGATIVE
Ketones, ur: NEGATIVE mg/dL
Leukocytes, UA: NEGATIVE
Nitrite: NEGATIVE
Protein, ur: NEGATIVE mg/dL
Urobilinogen, UA: 0.2 mg/dL (ref 0.0–1.0)
pH: 5 (ref 5.0–8.0)

## 2013-11-14 LAB — BASIC METABOLIC PANEL
BUN: 38 mg/dL — ABNORMAL HIGH (ref 6–23)
Chloride: 99 mEq/L (ref 96–112)
GFR calc Af Amer: 34 mL/min — ABNORMAL LOW (ref >90)
Glucose, Bld: 397 mg/dL — ABNORMAL HIGH (ref 70–99)
Potassium: 4.2 mEq/L (ref 3.5–5.1)

## 2013-11-14 LAB — CBC
HCT: 23.9 % — ABNORMAL LOW (ref 36.0–46.0)
Hemoglobin: 8.3 g/dL — ABNORMAL LOW (ref 12.0–15.0)
Platelets: 126 10*3/uL — ABNORMAL LOW (ref 150–400)
RDW: 14.4 % (ref 11.5–15.5)
WBC: 15.1 10*3/uL — ABNORMAL HIGH (ref 4.0–10.5)

## 2013-11-14 LAB — HEMOGLOBIN A1C
Hgb A1c MFr Bld: 8.6 % — ABNORMAL HIGH (ref <5.7)
Mean Plasma Glucose: 200 mg/dL — ABNORMAL HIGH (ref <117)

## 2013-11-14 LAB — GLUCOSE, CAPILLARY
Glucose-Capillary: 400 mg/dL — ABNORMAL HIGH (ref 70–99)
Glucose-Capillary: 260 mg/dL — ABNORMAL HIGH (ref 70–99)

## 2013-11-14 MED ORDER — INSULIN ASPART 100 UNIT/ML ~~LOC~~ SOLN
0.0000 [IU] | Freq: Three times a day (TID) | SUBCUTANEOUS | Status: DC
  Administered 2013-11-14: 20 [IU] via SUBCUTANEOUS
  Administered 2013-11-15: 7 [IU] via SUBCUTANEOUS
  Administered 2013-11-15: 11 [IU] via SUBCUTANEOUS
  Administered 2013-11-15 – 2013-11-16 (×2): 4 [IU] via SUBCUTANEOUS
  Administered 2013-11-16: 7 [IU] via SUBCUTANEOUS
  Administered 2013-11-17 (×2): 4 [IU] via SUBCUTANEOUS

## 2013-11-14 MED ORDER — INSULIN ASPART 100 UNIT/ML ~~LOC~~ SOLN
15.0000 [IU] | Freq: Once | SUBCUTANEOUS | Status: AC
  Administered 2013-11-14: 15 [IU] via SUBCUTANEOUS

## 2013-11-14 MED ORDER — VANCOMYCIN HCL IN DEXTROSE 1-5 GM/200ML-% IV SOLN
1000.0000 mg | Freq: Once | INTRAVENOUS | Status: AC
  Administered 2013-11-14: 1000 mg via INTRAVENOUS
  Filled 2013-11-14: qty 200

## 2013-11-14 MED ORDER — INSULIN ASPART 100 UNIT/ML ~~LOC~~ SOLN
0.0000 [IU] | Freq: Every day | SUBCUTANEOUS | Status: DC
  Administered 2013-11-14: 3 [IU] via SUBCUTANEOUS

## 2013-11-14 MED ORDER — VANCOMYCIN HCL IN DEXTROSE 750-5 MG/150ML-% IV SOLN
750.0000 mg | INTRAVENOUS | Status: DC
  Filled 2013-11-14: qty 150

## 2013-11-14 MED ORDER — INSULIN ASPART 100 UNIT/ML ~~LOC~~ SOLN
10.0000 [IU] | Freq: Once | SUBCUTANEOUS | Status: AC
  Administered 2013-11-14: 10 [IU] via SUBCUTANEOUS

## 2013-11-14 NOTE — Progress Notes (Signed)
Clinical Social Work Department BRIEF PSYCHOSOCIAL ASSESSMENT 11/14/2013  Patient:  Morgan Morris, Morgan Morris     Account Number:  [id number]     Admit date:  11/11/2013  Clinical Social Worker:  Hendricks Milo  Date/Time:  11/14/2013 04:33 PM  Referred by:  Physician  Date Referred:  11/14/2013 Referred for  SNF Placement   Other Referral:   Interview type:  Family Other interview type:    PSYCHOSOCIAL DATA Living Status:  FACILITY Admitted from facility:  [facility] Level of care:  Assisted Living Primary support name:  Mikiyah Glasner [phone] Primary support relationship to patient:  CHILD, ADULT Degree of support available:   Very supportive.    CURRENT CONCERNS  Other Concerns:    SOCIAL WORK ASSESSMENT / PLAN Per RN patient is not alert and oriented so Clinical Child psychotherapist (CSW) completed assessment via phone with her sons Gerlene Burdock and Jonny Ruiz. Both sons reported that they want the patient to go to [facility] because she has been there before and it is familiar to her. Sons agreeable to SNF search in [place].   Assessment/plan status:  Psychosocial Support/Ongoing Assessment of Needs Other assessment/ plan:   Information/referral to community resources:    PATIENT'S/FAMILY'S RESPONSE TO PLAN OF CARE: Both son's thanked CSW and were appreciative of phone call.

## 2013-11-14 NOTE — Progress Notes (Addendum)
[facility]                                                                                Patient Demographics  Morgan Morris, is a 79 y.o. female, DOB - February 15, 1920, WNU:[id number]  Admit date - 11/11/2013   Admitting Physician Eduard Clos, MD  Outpatient Primary MD for the patient is Juline Patch, MD  LOS - 3   Chief Complaint  Patient presents with  . Fall        Assessment & Plan    Principal Problem:   Femoral neck fracture Active Problems:   Atrial fibrillation   CKD (chronic kidney disease), stage III   Diabetes mellitus  Right hip fracture status post mechanical fall  -POD 2.  Ortho following. Continue pain control. -Patient will likely need rehabilitation.    Atrial fibrillation  -Currently rate controlled with Lopressor and diltiazem.   -Continue Eliquis   Leukocytosis secondary to urinary tract infection -UA showed TNTC WBC and large leukocytes -Continue ceftriaxone -Pending urine culture  History of CHF -Currently euvolemic  -Continue home medications, strict Is and Os, daily weights, and fluid restriction.  Chronic kidney disease  -Creatinine appears to be at baseline will continue to monitor.  Hyperglycemia with Diabetes mellitus type 2 -Will continue to insulin sliding scale and Lantus with CBGs. -Will change to resistant ISS. -Will obtain HbA1c and nutrition consult.  Anemia  -Likely secondary to recent surgery -H/H 8.3/23.9 currently stable continue to monitor CBC   Depression -Continue Seroquel and Zoloft  Code Status: Full  Family Communication: Son at bedside  Disposition Plan: Admitted   Procedures  Open treatment of femoral fracture, proximal end, neck, prosthetic replacement  Consults   Orthopedics  DVT Prophylaxis  Eliquis  Lab Results  Component Value Date   PLT 126* 11/14/2013    Medications  Scheduled Meds: . apixaban  2.5 mg Oral BID  . atorvastatin  10 mg Oral q1800  . cefTRIAXone  (ROCEPHIN)  IV  1 g Intravenous Q24H  . Chlorhexidine Gluconate Cloth  6 each Topical Q0600  . cholecalciferol  1,000 Units Oral Daily  . diltiazem  180 mg Oral Daily  . feeding supplement (GLUCERNA SHAKE)  237 mL Oral TID PC  . furosemide  20 mg Oral Q breakfast  . insulin aspart  0-20 Units Subcutaneous TID WC  . insulin aspart  0-5 Units Subcutaneous QHS  . insulin glargine  20 Units Subcutaneous QHS  . latanoprost  1 drop Both Eyes QHS  . metoprolol tartrate  25 mg Oral BID  . mupirocin ointment   Nasal BID  . QUEtiapine  12.5 mg Oral QHS  . senna  1 tablet Oral BID  . sertraline  50 mg Oral Daily  . timolol  1 drop Both Eyes Daily   Continuous Infusions: . sodium chloride 20 mL/hr at 11/12/13 0246  . sodium chloride 125 mL/hr at 11/14/13 0610   PRN Meds:.acetaminophen, acetaminophen, albuterol, albuterol, alum & mag hydroxide-simeth, fentaNYL, HYDROcodone-acetaminophen, menthol-cetylpyridinium, methocarbamol (ROBAXIN) IV, methocarbamol, metoCLOPramide (REGLAN) injection, metoCLOPramide, morphine injection, ondansetron (ZOFRAN) IV, ondansetron, oxyCODONE, phenol  Antibiotics    Anti-infectives   Start     Dose/Rate Route Frequency  Ordered Stop   11/13/13 0600  clindamycin (CLEOCIN) IVPB 600 mg  Status:  Discontinued     600 mg 100 mL/hr over 30 Minutes Intravenous Every 6 hours 11/13/13 0403 11/13/13 1025   11/12/13 2315  clindamycin (CLEOCIN) IVPB 600 mg     600 mg 100 mL/hr over 30 Minutes Intravenous  Once 11/12/13 2310 11/13/13 0005   11/12/13 1400  cefTRIAXone (ROCEPHIN) 1 g in dextrose 5 % 50 mL IVPB     1 g 100 mL/hr over 30 Minutes Intravenous Every 24 hours 11/12/13 1221        Time Spent in minutes   30 minutes   Javoris Star D.O. on 11/14/2013 at 1:00 PM  Between 7am to 7pm - Pager - [phone]  After 7pm go to [email or web address] - password [email or web address]  And look for the night coverage person covering for me after hours  [facility] Office   [phone]    Subjective:   Morgan Morris seen and examined today.  Patient complains of right hip pain. She has no other complaints. Patient denies dizziness, chest pain, shortness of breath, abdominal pain, N/V/D/C, new weakness, numbess, tingling.    Objective:   Filed Vitals:   11/14/13 0500 11/14/13 0612 11/14/13 0616 11/14/13 0900  BP:  113/38  104/41  Pulse:  72  65  Temp:    98 F (36.7 C)  TempSrc:    Oral  Resp:  18  18  Height:      Weight: 66 kg (145 lb 8.1 oz)     SpO2:  88% 95% 99%    Wt Readings from Last 3 Encounters:  11/14/13 66 kg (145 lb 8.1 oz)  11/14/13 66 kg (145 lb 8.1 oz)  08/17/13 59.512 kg (131 lb 3.2 oz)     Intake/Output Summary (Last 24 hours) at 11/14/13 1300 Last data filed at 11/13/13 2135  Gross per 24 hour  Intake      0 ml  Output      0 ml  Net      0 ml    Exam  General: Well developed, well nourished, NAD, appears stated age  HEENT: NCAT, PERRLA, EOMI, Anicteic Sclera, mucous membranes moist. No pharyngeal erythema or exudates  Neck: Supple, no JVD, no masses  Cardiovascular: S1 S2 auscultated, no rubs, murmurs or gallops. Regular rate and rhythm.  Respiratory: Clear to auscultation bilaterally with equal chest rise  Abdomen: Soft, nontender, nondistended, + bowel sounds  Extremities: warm dry without cyanosis clubbing or edema  Neuro: AAOx3, cranial nerves grossly intact. Strength appropriate for age in the RUE, LLE, LUE.  RLE strength not assessed due to hip pain.  Skin: Without rashes exudates or nodules.  Incision clean.    Psych: Normal affect and demeanor, depressed mood  Data Review   Micro Results Recent Results (from the past 240 hour(s))  SURGICAL PCR SCREEN     Status: Abnormal   Collection Time    11/12/13  4:22 AM      Result Value Range Status   MRSA, PCR POSITIVE (*) NEGATIVE Final   Staphylococcus aureus POSITIVE (*) NEGATIVE Final   Comment:            The Xpert SA Assay (FDA     approved  for NASAL specimens     in patients over 70 years of age),     is one component of     a comprehensive surveillance     program.  Test performance  has     been validated by [employer] for patients greater     than or equal to 60 year old.     It is not intended     to diagnose infection nor to     guide or monitor treatment.  URINE CULTURE     Status: None   Collection Time    11/12/13  9:47 AM      Result Value Range Status   Specimen Description URINE, CATHETERIZED   Final   Special Requests NONE   Final   Culture  Setup Time     Final   Value: 11/12/2013 11:19     Performed at [facility] Count     Final   Value: >=100,000 COLONIES/ML     Performed at [facility]   Culture     Final   Value: GRAM POSITIVE COCCI     Performed at [facility]   Report Status PENDING   Incomplete    Radiology Reports Dg Chest 2 View  11/11/2013   CLINICAL DATA:  Fall.  EXAM: CHEST  2 VIEW  COMPARISON:  09/02/2013  FINDINGS: The lungs are adequately inflated without definite consolidation or effusion. There is mild stable cardiomegaly. Remainder of the exam is unchanged.  IMPRESSION: No definite acute cardiopulmonary disease.  Stable cardiomegaly.   Electronically Signed   By: Elberta Fortis M.D.   On: 11/11/2013 20:56   Dg Hip 1 View Right  11/11/2013   CLINICAL DATA:  Fall.  Right hip pain.  EXAM: RIGHT HIP - 1 VIEW  COMPARISON:  03/29/2012  FINDINGS: Acute subcapital right femoral neck fracture noted with varus angulation and moderate displacement.  IMPRESSION: 1. Acute right femoral neck fracture.   Electronically Signed   By: Herbie Baltimore M.D.   On: 11/11/2013 20:20   Dg Femur Right  11/11/2013   CLINICAL DATA:  Right hip fracture  EXAM: RIGHT FEMUR - 2 VIEW  COMPARISON:  Prior radiograph of the right hip performed earlier on the same day.  FINDINGS: Again seen is an acute subcapital right femoral neck fracture, stable in position and alignment  relative to prior radiograph. No other fracture or dislocation seen involving the right femur. The right knee is grossly approximated. No soft tissue abnormality.  IMPRESSION: Unchanged right femoral neck fracture. No other acute fracture involving the right femur.   Electronically Signed   By: Rise Mu M.D.   On: 11/11/2013 23:06    CBC  Recent Labs Lab 11/11/13 2135 11/12/13 0551 11/13/13 0554 11/14/13 0422  WBC 14.5* 14.6* 16.3* 15.1*  HGB 11.4* 12.1 9.8* 8.3*  HCT 31.9* 34.5* 28.3* 23.9*  PLT 150 156 136* 126*  MCV 88.6 90.3 91.0 91.6  MCH 31.7 31.7 31.5 31.8  MCHC 35.7 35.1 34.6 34.7  RDW 13.3 13.9 14.2 14.4  LYMPHSABS 0.8  --   --   --   MONOABS 1.0  --   --   --   EOSABS 0.5  --   --   --   BASOSABS 0.1  --   --   --     Chemistries   Recent Labs Lab 11/11/13 2135 11/12/13 0551 11/13/13 0554 11/14/13 0422  NA 134* 136 136 133*  K 3.7 4.9 4.7 4.2  CL 97 99 101 99  CO2 24 26 21 21   GLUCOSE 129* 285* 299* 397*  BUN 41* 39* 34* 38*  CREATININE  1.47* 1.43* 1.49* 1.49*  CALCIUM 9.4 9.1 8.2* 7.8*   ------------------------------------------------------------------------------------------------------------------ estimated creatinine clearance is 21.9 ml/min (by C-G formula based on Cr of 1.49). ------------------------------------------------------------------------------------------------------------------ No results found for this basename: HGBA1C,  in the last 72 hours ------------------------------------------------------------------------------------------------------------------ No results found for this basename: CHOL, HDL, LDLCALC, TRIG, CHOLHDL, LDLDIRECT,  in the last 72 hours ------------------------------------------------------------------------------------------------------------------ No results found for this basename: TSH, T4TOTAL, FREET3, T3FREE, THYROIDAB,  in the last 72  hours ------------------------------------------------------------------------------------------------------------------ No results found for this basename: VITAMINB12, FOLATE, FERRITIN, TIBC, IRON, RETICCTPCT,  in the last 72 hours  Coagulation profile  Recent Labs Lab 11/11/13 2135  INR 1.19    No results found for this basename: DDIMER,  in the last 72 hours  Cardiac Enzymes No results found for this basename: CK, CKMB, TROPONINI, MYOGLOBIN,  in the last 168 hours ------------------------------------------------------------------------------------------------------------------ No components found with this basename: POCBNP,

## 2013-11-14 NOTE — Progress Notes (Signed)
ANTIBIOTIC CONSULT NOTE - INITIAL  Pharmacy Consult:  Vancomycin Indication:  GPC UTI  Allergies  Allergen Reactions  . Penicillins     unknown  . Sulfa Antibiotics     unknown    Patient Measurements: Height: 5\' 2"  (157.5 cm) Weight: 145 lb 8.1 oz (66 kg) IBW/kg (Calculated) : 50.1  Vital Signs: Temp: 98.7 F (37.1 C) (11/23 1705) Temp src: Oral (11/23 1705) BP: 118/40 mmHg (11/23 1705) Pulse Rate: 68 (11/23 1705) Intake/Output from previous day: 11/22 0701 - 11/23 0700 In: 0  Out: 250 [Urine:250]  Labs:  Recent Labs  11/12/13 0551 11/13/13 0554 11/14/13 0422  WBC 14.6* 16.3* 15.1*  HGB 12.1 9.8* 8.3*  PLT 156 136* 126*  CREATININE 1.43* 1.49* 1.49*   Estimated Creatinine Clearance: 21.9 ml/min (by C-G formula based on Cr of 1.49). No results found for this basename: VANCOTROUGH, Leodis Binet, VANCORANDOM, GENTTROUGH, GENTPEAK, GENTRANDOM, TOBRATROUGH, TOBRAPEAK, TOBRARND, AMIKACINPEAK, AMIKACINTROU, AMIKACIN,  in the last 72 hours   Microbiology: Recent Results (from the past 720 hour(s))  SURGICAL PCR SCREEN     Status: Abnormal   Collection Time    11/12/13  4:22 AM      Result Value Range Status   MRSA, PCR POSITIVE (*) NEGATIVE Final   Staphylococcus aureus POSITIVE (*) NEGATIVE Final   Comment:            The Xpert SA Assay (FDA     approved for NASAL specimens     in patients over 21 years of age),     is one component of     a comprehensive surveillance     program.  Test performance has     been validated by [employer] for patients greater     than or equal to 85 year old.     It is not intended     to diagnose infection nor to     guide or monitor treatment.  URINE CULTURE     Status: None   Collection Time    11/12/13  9:47 AM      Result Value Range Status   Specimen Description URINE, CATHETERIZED   Final   Special Requests NONE   Final   Culture  Setup Time     Final   Value: 11/12/2013 11:19     Performed at [facility] Count     Final   Value: >=100,000 COLONIES/ML     Performed at [facility]   Culture     Final   Value: GRAM POSITIVE COCCI     Performed at [facility]   Report Status PENDING   Incomplete    Medical History: Past Medical History  Diagnosis Date  . Hypertension   . Depression   . Erosive esophagitis 04/02/2012  . Mallory - Weiss tear 04/02/2012  . Dysrhythmia 02/24/2013    ATRIAL FIB WITH RVR  . Arthritis   . Atrial fibrillation   . CHF (congestive heart failure)   . Diabetes mellitus       Assessment: 79 YOF known to Pharmacy from Eliquis monitoring.  Patient was started on Rocephin for a UTI, which now grew GPC and vancomycin to be added.  Patient has renal insufficiency.  Rocephin 11/21 >> clinda x 1 11/21 Vanc 11/23 >>  11/21 urine cx - GPC 11/23 blood cx   Goal of Therapy:  Vancomycin trough level 10-15 mcg/ml   Plan:  -  Vanc 1gm IV x 1, then 750mg  IV Q24H - Monitor renal fxn, clinical course, vanc trough as indicated - F/U micro data to de-escalate abx - F/U CBG and insulin adjustment, may benefit from an insulin gtt if CBGs remain above 300    Verline Kong D. Laney Potash, PharmD, BCPS Pager:  [phone] 11/14/2013, 6:44 PM

## 2013-11-14 NOTE — Progress Notes (Signed)
Clinical Social Work Department CLINICAL SOCIAL WORK PLACEMENT NOTE 11/14/2013  Patient:  Morgan Morris, Morgan Morris  Account Number:  [id number] Admit date:  11/11/2013  Clinical Social Worker:  Jetta Lout, [place]  Date/time:  11/14/2013 04:40 PM  Clinical Social Work is seeking post-discharge placement for this patient at the following level of care:   SKILLED NURSING   (*CSW will update this form in Epic as items are completed)   11/14/2013  Patient/family provided with [facility] Department of Clinical Social Work's list of facilities offering this level of care within the geographic area requested by the patient (or if unable, by the patient's family).  11/14/2013  Patient/family informed of their freedom to choose among providers that offer the needed level of care, that participate in Medicare, Medicaid or managed care program needed by the patient, have an available bed and are willing to accept the patient.  11/14/2013  Patient/family informed of [facility]' ownership interest in [facility], as well as of the fact that they are under no obligation to receive care at this facility.  PASARR submitted to EDS on  PASARR number received from EDS on   FL2 transmitted to all facilities in geographic area requested by pt/family on  11/14/2013 FL2 transmitted to all facilities within larger geographic area on   Patient informed that his/her managed care company has contracts with or will negotiate with  certain facilities, including the following:     Patient/family informed of bed offers received:   Patient chooses bed at  Physician recommends and patient chooses bed at    Patient to be transferred to  on   Patient to be transferred to facility by   The following physician request were entered in Epic:   Additional Comments: Patient has existing PASARR.

## 2013-11-14 NOTE — Progress Notes (Signed)
CRN called into room by staff nurse, pt exhibited change in mental status.  PA called, new orders performed see MAR. Nsg to continue to monitor for status changes.

## 2013-11-14 NOTE — Progress Notes (Signed)
   Subjective:  Patient reports pain as moderate.    Objective:   VITALS:   Filed Vitals:   11/14/13 0500 11/14/13 0612 11/14/13 0616 11/14/13 0900  BP:  113/38  104/41  Pulse:  72  65  Temp:    98 F (36.7 C)  TempSrc:    Oral  Resp:  18  18  Height:      Weight: 66 kg (145 lb 8.1 oz)     SpO2:  88% 95% 99%    Neurologically intact Neurovascular intact Sensation intact distally Intact pulses distally Dorsiflexion/Plantar flexion intact Incision: dressing C/D/I and no drainage No cellulitis present Compartment soft Drain intact   Lab Results  Component Value Date   WBC 15.1* 11/14/2013   HGB 8.3* 11/14/2013   HCT 23.9* 11/14/2013   MCV 91.6 11/14/2013   PLT 126* 11/14/2013     Assessment/Plan: 2 Days Post-Op   Problem List Items Addressed This Visit     Cardiovascular and Mediastinum   Atrial fibrillation   Relevant Medications      metoprolol tartrate (LOPRESSOR) tablet 25 mg      diltiazem (CARDIZEM CD) 24 hr capsule 180 mg      atorvastatin (LIPITOR) tablet 10 mg      apixaban (ELIQUIS) tablet      apixaban (ELIQUIS) tablet 2.5 mg      furosemide (LASIX) tablet 20 mg   Acute diastolic CHF (congestive heart failure)     Endocrine   Diabetes mellitus   Relevant Medications      insulin glargine (LANTUS) injection 20 Units      insulin aspart (novoLOG) injection 15 Units (Completed)      insulin aspart (novoLOG) injection 0-20 Units (Start on 11/14/2013 12:00 PM)      insulin aspart (novoLOG) injection 0-5 Units (Start on 11/14/2013 10:00 PM)     Musculoskeletal and Integument   *Femoral neck fracture     Genitourinary   CKD (chronic kidney disease), stage III   UTI (lower urinary tract infection)   Relevant Medications      mupirocin ointment (BACTROBAN) 2 %      clindamycin (CLEOCIN) IVPB 600 mg (Completed)     Other   Anemia    Other Visit Diagnoses   Femur fracture, right, closed, initial encounter    -  Primary    Relevant Orders         Weight bearing as tolerated       Advance diet Up with therapy DVT ppx - SCDs, ambulation, eliquis WBAT RLE Pain control hemovac pulled   Cheral Almas 11/14/2013, 10:37 AM [phone]

## 2013-11-15 LAB — BASIC METABOLIC PANEL
BUN: 43 mg/dL — ABNORMAL HIGH (ref 6–23)
CO2: 20 mEq/L (ref 19–32)
Chloride: 97 mEq/L (ref 96–112)
Creatinine, Ser: 1.59 mg/dL — ABNORMAL HIGH (ref 0.50–1.10)
GFR calc Af Amer: 32 mL/min — ABNORMAL LOW (ref >90)
GFR calc non Af Amer: 27 mL/min — ABNORMAL LOW (ref >90)
Sodium: 132 mEq/L — ABNORMAL LOW (ref 135–145)

## 2013-11-15 LAB — GLUCOSE, CAPILLARY
Glucose-Capillary: 283 mg/dL — ABNORMAL HIGH (ref 70–99)
Glucose-Capillary: 205 mg/dL — ABNORMAL HIGH (ref 70–99)
Glucose-Capillary: 196 mg/dL — ABNORMAL HIGH (ref 70–99)

## 2013-11-15 LAB — CBC
Platelets: 148 10*3/uL — ABNORMAL LOW (ref 150–400)
RBC: 2.49 MIL/uL — ABNORMAL LOW (ref 3.87–5.11)
RDW: 14.3 % (ref 11.5–15.5)
WBC: 15.8 10*3/uL — ABNORMAL HIGH (ref 4.0–10.5)

## 2013-11-15 MED ORDER — VANCOMYCIN HCL 500 MG IV SOLR
500.0000 mg | INTRAVENOUS | Status: DC
  Administered 2013-11-15: 500 mg via INTRAVENOUS
  Filled 2013-11-15 (×2): qty 500

## 2013-11-15 NOTE — Progress Notes (Signed)
Subjective:  Patient reports pain as moderate.    Objective:   VITALS:   Filed Vitals:   11/14/13 0900 11/14/13 1705 11/14/13 2219 11/15/13 0537  BP: 104/41 118/40 134/41 131/50  Pulse: 65 68 79 74  Temp: 98 F (36.7 C) 98.7 F (37.1 C) 97.8 F (36.6 C) 97.4 F (36.3 C)  TempSrc: Oral Oral Oral Oral  Resp: 18 18 18 18   Height:      Weight:    65.5 kg (144 lb 6.4 oz)  SpO2: 99% 95% 100% 100%    Neurologically intact Neurovascular intact Sensation intact distally Intact pulses distally Dorsiflexion/Plantar flexion intact Incision: dressing C/D/I and no drainage No cellulitis present Compartment soft Drain intact   Lab Results  Component Value Date   WBC 15.8* 11/15/2013   HGB 7.9* 11/15/2013   HCT 22.8* 11/15/2013   MCV 91.6 11/15/2013   PLT 148* 11/15/2013     Assessment/Plan: 3 Days Post-Op   Problem List Items Addressed This Visit     Cardiovascular and Mediastinum   Atrial fibrillation   Relevant Medications      metoprolol tartrate (LOPRESSOR) tablet 25 mg      diltiazem (CARDIZEM CD) 24 hr capsule 180 mg      atorvastatin (LIPITOR) tablet 10 mg      apixaban (ELIQUIS) tablet      apixaban (ELIQUIS) tablet 2.5 mg      furosemide (LASIX) tablet 20 mg   Acute diastolic CHF (congestive heart failure)     Endocrine   Diabetes mellitus   Relevant Medications      insulin glargine (LANTUS) injection 20 Units      insulin aspart (novoLOG) injection 15 Units (Completed)      insulin aspart (novoLOG) injection 0-20 Units      insulin aspart (novoLOG) injection 0-5 Units      insulin aspart (novoLOG) injection 10 Units (Completed)     Musculoskeletal and Integument   *Femoral neck fracture     Genitourinary   CKD (chronic kidney disease), stage III   UTI (lower urinary tract infection)   Relevant Medications      mupirocin ointment (BACTROBAN) 2 %      clindamycin (CLEOCIN) IVPB 600 mg (Completed)      vancomycin (VANCOCIN) IVPB 1000 mg/200 mL  premix (Completed)      vancomycin (VANCOCIN) IVPB 750 mg/150 ml premix (Start on 11/15/2013  8:00 PM)     Other   Anemia   History of dementia    Other Visit Diagnoses   Femur fracture, right, closed, initial encounter    -  Primary    Relevant Orders       Weight bearing as tolerated       Advance diet Up with therapy DVT ppx - SCDs, ambulation, eliquis WBAT RLE Pain control F/u 2 weeks   Cheral Almas 11/15/2013, 8:11 AM [phone]

## 2013-11-15 NOTE — Progress Notes (Signed)
ANTIBIOTIC CONSULT NOTE - Follow up  Pharmacy Consult:  Vancomycin, Ceftriaxone, Eliquis  Indication:  GPC UTI, Afib   Allergies  Allergen Reactions  . Penicillins     unknown  . Sulfa Antibiotics     unknown    Patient Measurements: Height: 5\' 2"  (157.5 cm) Weight: 144 lb 6.4 oz (65.5 kg) IBW/kg (Calculated) : 50.1  Vital Signs: Temp: 97.4 F (36.3 C) (11/24 0537) Temp src: Oral (11/24 0537) BP: 131/50 mmHg (11/24 0537) Pulse Rate: 74 (11/24 0537) Intake/Output from previous day: 11/23 0701 - 11/24 0700 In: 240 [P.O.:120; I.V.:120] Out: 1150 [Urine:1150]  Labs:  Recent Labs  11/13/13 0554 11/14/13 0422 11/15/13 0250  WBC 16.3* 15.1* 15.8*  HGB 9.8* 8.3* 7.9*  PLT 136* 126* 148*  CREATININE 1.49* 1.49* 1.59*   Estimated Creatinine Clearance: 20.5 ml/min (by C-G formula based on Cr of 1.59). No results found for this basename: VANCOTROUGH, VANCOPEAK, VANCORANDOM, GENTTROUGH, GENTPEAK, GENTRANDOM, TOBRATROUGH, TOBRAPEAK, TOBRARND, AMIKACINPEAK, AMIKACINTROU, AMIKACIN,  in the last 72 hours    Assessment: 43 YOF known to Pharmacy from Eliquis monitoring.  Patient was started on Rocephin for a UTI, which grew GPC and vancomycin was added.  Patient has renal insufficiency. SCr trended up to 1.59, CrCl ~ 78mL/min, WBC 15.8, afebrile, Hgb trend down to 7.9 (likely sec to surgery) but Plt trend up to 148. No bleeding noted.     Rocephin 11/21 >> clinda x 1 11/21 Vanc 11/23 >>  11/21 urine cx - GPC 11/23 blood cx >> 11/23 Urine Cx >>   Goal of Therapy:  Vancomycin trough level 10-15 mcg/ml  Vancomycin Kinetics: Ke= 0.021 Vd = 47 L   Plan:  -Given decline in renal fx, decrease Vancomycin dose to 500 mg IV Q 24 hours for predicted trough of ~ 15 -Continue ceftriaxone 1 gm IV Q 24 hours  - Continue Eliquis 2.5 mg PO two times daily  - Monitor renal fxn, clinical course, vanc trough as indicated - F/U micro data to de-escalate abx   Vinnie Level,  PharmD.  Clinical Pharmacist Pager [phone]

## 2013-11-15 NOTE — Progress Notes (Signed)
Physical Therapy Treatment Patient Details Name: Morgan Morris MRN: [id number] DOB: Feb 15, 1920 Today's Date: 11/15/2013 Time: 4098-1191 PT Time Calculation (min): 11 min  PT Assessment / Plan / Recommendation  History of Present Illness 79 y.o.. year old female who suffered a ground level fall and was found to have sustained a Right hip fracture.  Right Hip Hemiarthroplasy on 11/12/13   PT Comments   Patient with agitation during session today.  Change in cognition - RN notified.  Patient unable to participate/ cooperate with PT.  Minimal session today.  Follow Up Recommendations  SNF     Does the patient have the potential to tolerate intense rehabilitation     Barriers to Discharge        Equipment Recommendations  None recommended by PT    Recommendations for Other Services    Frequency Min 3X/week   Progress towards PT Goals Progress towards PT goals: Not progressing toward goals - comment (Due to agitation)  Plan Current plan remains appropriate    Precautions / Restrictions Precautions Precautions: Fall;Posterior Hip Restrictions Weight Bearing Restrictions: Yes RLE Weight Bearing: Weight bearing as tolerated   Pertinent Vitals/Pain     Mobility  Bed Mobility Bed Mobility: Supine to Sit;Sit to Supine Supine to Sit: 1: +2 Total assist Supine to Sit: Patient Percentage: 0% Sit to Supine: 1: +2 Total assist;HOB flat Sit to Supine: Patient Percentage: 20% Details for Bed Mobility Assistance: Verbal and tactile cues for technique.  Patient yelling and hitting at therapist.  Able to move patient's LE's off of bed, but unable to reach upright sitting position.  Patient pushing/fighting against therapist.  Returned to supine.  RN notified of change in cognition. Transfers Transfers: Not assessed     PT Goals (current goals can now be found in the care plan section)    Visit Information  Last PT Received On: 11/15/13 Assistance Needed: +2 History of Present  Illness: 79 y.o.. year old female who suffered a ground level fall and was found to have sustained a Right hip fracture.  Right Hip Hemiarthroplasy on 11/12/13    Subjective Data  Subjective: "This is harrassment.  Call the police"   Cognition  Cognition Arousal/Alertness: Lethargic;Awake/alert (Initially lethargic. Became alert with mobility.) Behavior During Therapy: Agitated Overall Cognitive Status: Impaired/Different from baseline Memory: Decreased short-term memory;Decreased recall of precautions General Comments: Patient became very agitated as PT initiated mobility.  Patient hitting at therapist.  Yelling that "this is harrassment" and "call the police".  Patient cursing at therapist.  Friend in room, stating this is NOT normal for patient.  She is usually cooperative and nice.  RN notified of this behavior/cognitive change.    Balance     End of Session PT - End of Session Activity Tolerance: Treatment limited secondary to agitation;Patient limited by pain Patient left: in bed;with call bell/phone within reach;with family/visitor present Nurse Communication: Mobility status;Other (comment) (Increased agitation)   GP     Vena Austria 11/15/2013, 4:37 PM Durenda Hurt. Renaldo Fiddler, [facility] Acute Rehab Services Pager [phone]

## 2013-11-15 NOTE — Progress Notes (Signed)
NUTRITION FOLLOW UP/CONSULT  Intervention:    1. Continue Glucerna Shake po TID, each supplement provides 220 kcal and 10 grams of protein.  2. Encouraged PO intake at meals.   3. Pt not appropriate for diet education at this time.   Nutrition Dx:   Increased nutrient needs related to fracture as evidenced by estimated needs; ongoing.   Goal:  Pt to meet >/= 90% of their estimated nutrition needs; not met.   Monitor:  PO intake, supplement acceptance, weight trend, labs  Assessment:   Pt admitted from her living facitily after a fall with hip pain. Pt has right femoral neck fracture. Plan for surgery 11/21 pm.  Pt reports that she has had no recent weight changes and good appetite PTA. Pt eats Breakfast every morning in her room (cereal, 1/2 banana, milk). Lunch and Laural Golden are in [place] where she lives. Pt has been drinking some form of supplement PTA but does not feel she needs it. Consult received for pt with uncontrolled DM, hgbA1C: 8.6. Plan for d/c to a SNF.  Pt is confused this morning, thinks she has interrupted my meeting. Pt had ate only bites at Breakfast. No family in room, RN in room with pt and reports that pt has been confused the last couple of days. Pt is not currently appropriate for any education at this time.   Height: Ht Readings from Last 1 Encounters:  11/12/13 5\' 2"  (1.575 m)    Weight Status:   Wt Readings from Last 1 Encounters:  11/15/13 144 lb 6.4 oz (65.5 kg)  Admission weight 148 lb (67.1 kg)  Re-estimated needs:  Kcal: 1400-1600  Protein: 60-75 grams  Fluid: >1.5 L/day  Skin: right hip incision   Diet Order: Carb Control Meal Completion: <40%   Intake/Output Summary (Last 24 hours) at 11/15/13 1115 Last data filed at 11/15/13 0900  Gross per 24 hour  Intake    480 ml  Output   1150 ml  Net   -670 ml    Last BM: PTA   Labs:   Recent Labs Lab 11/13/13 0554 11/14/13 0422 11/15/13 0250  NA 136 133* 132*  K 4.7 4.2  3.5  CL 101 99 97  CO2 21 21 20   BUN 34* 38* 43*  CREATININE 1.49* 1.49* 1.59*  CALCIUM 8.2* 7.8* 8.1*  GLUCOSE 299* 397* 282*    CBG (last 3)   Recent Labs  11/14/13 1658 11/14/13 2222 11/15/13 0637  GLUCAP 113* 260* 283*   Lab Results  Component Value Date   HGBA1C 8.6* 11/14/2013   Scheduled Meds: . apixaban  2.5 mg Oral BID  . atorvastatin  10 mg Oral q1800  . cefTRIAXone (ROCEPHIN)  IV  1 g Intravenous Q24H  . Chlorhexidine Gluconate Cloth  6 each Topical Q0600  . cholecalciferol  1,000 Units Oral Daily  . diltiazem  180 mg Oral Daily  . feeding supplement (GLUCERNA SHAKE)  237 mL Oral TID PC  . furosemide  20 mg Oral Q breakfast  . insulin aspart  0-20 Units Subcutaneous TID WC  . insulin aspart  0-5 Units Subcutaneous QHS  . insulin glargine  20 Units Subcutaneous QHS  . latanoprost  1 drop Both Eyes QHS  . metoprolol tartrate  25 mg Oral BID  . mupirocin ointment   Nasal BID  . QUEtiapine  12.5 mg Oral QHS  . senna  1 tablet Oral BID  . sertraline  50 mg Oral Daily  . timolol  1 drop Both Eyes Daily  . vancomycin  750 mg Intravenous Q24H    Continuous Infusions: . sodium chloride 20 mL/hr at 11/12/13 0246  . sodium chloride 125 mL/hr at 11/14/13 6295    [facility] RD, LDN, CNSC [phone] Pager [phone] After Hours Pager

## 2013-11-15 NOTE — Anesthesia Postprocedure Evaluation (Signed)
  Anesthesia Post-op Note  Patient: Morgan Morris  Procedure(s) Performed: Procedure(s): RIGHT HIP HEMIARTHROPLASTY (Right)  Patient Location: Nursing Unit  Anesthesia Type:General  Level of Consciousness: awake, patient cooperative and confused  Airway and Oxygen Therapy: Patient Spontanous Breathing  Post-op Pain: none  Post-op Assessment: Post-op Vital signs reviewed, Patient's Cardiovascular Status Stable and Respiratory Function Stable  Post-op Vital Signs: Reviewed and stable  Complications: No apparent anesthesia complications

## 2013-11-15 NOTE — Progress Notes (Signed)
OT Cancellation Note and Discharge  Patient Details Name: Morgan Morris MRN: [id number] DOB: [birth year]   Cancelled Treatment:    Reason Eval/Treat Not Completed: Other (comment). Plan is for pt to go to SNF, will defer to that facility. Acute OT will sign off.  Evette Georges [phone] 11/15/2013, 7:41 AM

## 2013-11-15 NOTE — Progress Notes (Signed)
[facility]                                                                                Patient Demographics  Morgan Morris, is a 79 y.o. female, DOB - 1920/11/16, WUJ:[id number]  Admit date - 11/11/2013   Admitting Physician Eduard Clos, MD  Outpatient Primary MD for the patient is Juline Patch, MD  LOS - 4   Chief Complaint  Patient presents with  . Fall        Assessment & Plan  Principal Problem:   Femoral neck fracture Active Problems:   Atrial fibrillation   CKD (chronic kidney disease), stage III   Diabetes mellitus  Right hip fracture status post mechanical fall  -POD 3.  Ortho following. Continue pain control. -Patient will likely need rehabilitation.    Atrial fibrillation  -Currently rate controlled with Lopressor and diltiazem.   -Continue Eliquis   Leukocytosis secondary to urinary tract infection -UA showed TNTC WBC and large leukocytes -Continue ceftriaxone -Urine culture shows gram positive cocci, could be contaminant, placed on vancomycin.  History of CHF -Currently euvolemic  -Continue home medications, strict Is and Os, daily weights, and fluid restriction.  Chronic kidney disease  -Creatinine appears to be at baseline will continue to monitor.  Hyperglycemia with Diabetes mellitus type 2 -Will continue to insulin sliding scale and Lantus with CBGs. -Will change to resistant ISS. -Will obtain HbA1c and nutrition consult.  Anemia  -Likely secondary to recent surgery -H/H 7.9/22.8, currently stable continue to monitor CBC   Depression -Continue Seroquel and Zoloft  Code Status: Full  Family Communication: Son at bedside  Disposition Plan: Admitted   Procedures  Open treatment of femoral fracture, proximal end, neck, prosthetic replacement  Consults   Orthopedics  DVT Prophylaxis  Eliquis  Lab Results  Component Value Date   PLT 148* 11/15/2013    Medications  Scheduled Meds: . apixaban  2.5 mg Oral BID   . atorvastatin  10 mg Oral q1800  . cefTRIAXone (ROCEPHIN)  IV  1 g Intravenous Q24H  . Chlorhexidine Gluconate Cloth  6 each Topical Q0600  . cholecalciferol  1,000 Units Oral Daily  . diltiazem  180 mg Oral Daily  . feeding supplement (GLUCERNA SHAKE)  237 mL Oral TID PC  . furosemide  20 mg Oral Q breakfast  . insulin aspart  0-20 Units Subcutaneous TID WC  . insulin aspart  0-5 Units Subcutaneous QHS  . insulin glargine  20 Units Subcutaneous QHS  . latanoprost  1 drop Both Eyes QHS  . metoprolol tartrate  25 mg Oral BID  . mupirocin ointment   Nasal BID  . QUEtiapine  12.5 mg Oral QHS  . senna  1 tablet Oral BID  . sertraline  50 mg Oral Daily  . timolol  1 drop Both Eyes Daily  . vancomycin  500 mg Intravenous Q24H   Continuous Infusions: . sodium chloride 20 mL/hr at 11/12/13 0246  . sodium chloride 125 mL/hr at 11/14/13 0610   PRN Meds:.acetaminophen, acetaminophen, albuterol, albuterol, alum & mag hydroxide-simeth, fentaNYL, HYDROcodone-acetaminophen, menthol-cetylpyridinium, methocarbamol (ROBAXIN) IV, methocarbamol, metoCLOPramide (REGLAN) injection, metoCLOPramide, morphine injection, ondansetron (ZOFRAN) IV, ondansetron, oxyCODONE, phenol  Antibiotics    Anti-infectives   Start     Dose/Rate Route Frequency Ordered Stop   11/15/13 2000  vancomycin (VANCOCIN) IVPB 750 mg/150 ml premix  Status:  Discontinued     750 mg 150 mL/hr over 60 Minutes Intravenous Every 24 hours 11/14/13 1845 11/15/13 1125   11/15/13 2000  vancomycin (VANCOCIN) 500 mg in sodium chloride 0.9 % 100 mL IVPB     500 mg 100 mL/hr over 60 Minutes Intravenous Every 24 hours 11/15/13 1125     11/14/13 1900  vancomycin (VANCOCIN) IVPB 1000 mg/200 mL premix     1,000 mg 200 mL/hr over 60 Minutes Intravenous  Once 11/14/13 1845 11/14/13 2104   11/13/13 0600  clindamycin (CLEOCIN) IVPB 600 mg  Status:  Discontinued     600 mg 100 mL/hr over 30 Minutes Intravenous Every 6 hours 11/13/13 0403 11/13/13  1025   11/12/13 2315  clindamycin (CLEOCIN) IVPB 600 mg     600 mg 100 mL/hr over 30 Minutes Intravenous  Once 11/12/13 2310 11/13/13 0005   11/12/13 1400  cefTRIAXone (ROCEPHIN) 1 g in dextrose 5 % 50 mL IVPB     1 g 100 mL/hr over 30 Minutes Intravenous Every 24 hours 11/12/13 1221        Time Spent in minutes   30 minutes   Amal Renbarger D.O. on 11/15/2013 at 12:08 PM  Between 7am to 7pm - Pager - [phone]  After 7pm go to [email or web address] - password [email or web address]  And look for the night coverage person covering for me after hours  [facility] Office  [phone]    Subjective:   Morgan Morris seen and examined today.  Patient complains of right hip pain. She has no other complaints. Patient denies dizziness, chest pain, shortness of breath, abdominal pain, N/V/D/C, new weakness, numbess, tingling.    Objective:   Filed Vitals:   11/14/13 2219 11/15/13 0537 11/15/13 1122 11/15/13 1124  BP: 134/41 131/50 131/50 131/50  Pulse: 79 74 74   Temp: 97.8 F (36.6 C) 97.4 F (36.3 C)    TempSrc: Oral Oral    Resp: 18 18    Height:      Weight:  65.5 kg (144 lb 6.4 oz)    SpO2: 100% 100%      Wt Readings from Last 3 Encounters:  11/15/13 65.5 kg (144 lb 6.4 oz)  11/15/13 65.5 kg (144 lb 6.4 oz)  08/17/13 59.512 kg (131 lb 3.2 oz)     Intake/Output Summary (Last 24 hours) at 11/15/13 1208 Last data filed at 11/15/13 0900  Gross per 24 hour  Intake    480 ml  Output   1150 ml  Net   -670 ml    Exam  General: Well developed, well nourished, NAD, appears stated age  HEENT: NCAT, PERRLA, EOMI, Anicteic Sclera, mucous membranes moist. No pharyngeal erythema or exudates  Neck: Supple, no JVD, no masses  Cardiovascular: S1 S2 auscultated, no rubs, murmurs or gallops. Regular rate and rhythm.  Respiratory: Clear to auscultation bilaterally with equal chest rise  Abdomen: Soft, nontender, nondistended, + bowel sounds  Extremities: warm dry without  cyanosis clubbing or edema  Neuro: AAOx3, cranial nerves grossly intact. Strength appropriate for age in the RUE, LLE, LUE.  RLE strength not assessed due to hip pain.  Skin: Without rashes exudates or nodules.  Incision clean.    Psych: Normal affect and demeanor, depressed mood  Data Review   Micro Results Recent Results (from  the past 240 hour(s))  SURGICAL PCR SCREEN     Status: Abnormal   Collection Time    11/12/13  4:22 AM      Result Value Range Status   MRSA, PCR POSITIVE (*) NEGATIVE Final   Staphylococcus aureus POSITIVE (*) NEGATIVE Final   Comment:            The Xpert SA Assay (FDA     approved for NASAL specimens     in patients over 44 years of age),     is one component of     a comprehensive surveillance     program.  Test performance has     been validated by [employer] for patients greater     than or equal to 24 year old.     It is not intended     to diagnose infection nor to     guide or monitor treatment.  URINE CULTURE     Status: None   Collection Time    11/12/13  9:47 AM      Result Value Range Status   Specimen Description URINE, CATHETERIZED   Final   Special Requests NONE   Final   Culture  Setup Time     Final   Value: 11/12/2013 11:19     Performed at [facility] Count     Final   Value: >=100,000 COLONIES/ML     Performed at [facility]   Culture     Final   Value: GRAM POSITIVE COCCI     Performed at [facility]   Report Status PENDING   Incomplete    Radiology Reports Dg Chest 2 View  11/11/2013   CLINICAL DATA:  Fall.  EXAM: CHEST  2 VIEW  COMPARISON:  09/02/2013  FINDINGS: The lungs are adequately inflated without definite consolidation or effusion. There is mild stable cardiomegaly. Remainder of the exam is unchanged.  IMPRESSION: No definite acute cardiopulmonary disease.  Stable cardiomegaly.   Electronically Signed   By: Elberta Fortis M.D.   On: 11/11/2013 20:56   Dg Hip 1 View  Right  11/11/2013   CLINICAL DATA:  Fall.  Right hip pain.  EXAM: RIGHT HIP - 1 VIEW  COMPARISON:  03/29/2012  FINDINGS: Acute subcapital right femoral neck fracture noted with varus angulation and moderate displacement.  IMPRESSION: 1. Acute right femoral neck fracture.   Electronically Signed   By: Herbie Baltimore M.D.   On: 11/11/2013 20:20   Dg Femur Right  11/11/2013   CLINICAL DATA:  Right hip fracture  EXAM: RIGHT FEMUR - 2 VIEW  COMPARISON:  Prior radiograph of the right hip performed earlier on the same day.  FINDINGS: Again seen is an acute subcapital right femoral neck fracture, stable in position and alignment relative to prior radiograph. No other fracture or dislocation seen involving the right femur. The right knee is grossly approximated. No soft tissue abnormality.  IMPRESSION: Unchanged right femoral neck fracture. No other acute fracture involving the right femur.   Electronically Signed   By: Rise Mu M.D.   On: 11/11/2013 23:06    CBC  Recent Labs Lab 11/11/13 2135 11/12/13 0551 11/13/13 0554 11/14/13 0422 11/15/13 0250  WBC 14.5* 14.6* 16.3* 15.1* 15.8*  HGB 11.4* 12.1 9.8* 8.3* 7.9*  HCT 31.9* 34.5* 28.3* 23.9* 22.8*  PLT 150 156 136* 126* 148*  MCV 88.6 90.3 91.0 91.6 91.6  MCH 31.7  31.7 31.5 31.8 31.7  MCHC 35.7 35.1 34.6 34.7 34.6  RDW 13.3 13.9 14.2 14.4 14.3  LYMPHSABS 0.8  --   --   --   --   MONOABS 1.0  --   --   --   --   EOSABS 0.5  --   --   --   --   BASOSABS 0.1  --   --   --   --     Chemistries   Recent Labs Lab 11/11/13 2135 11/12/13 0551 11/13/13 0554 11/14/13 0422 11/15/13 0250  NA 134* 136 136 133* 132*  K 3.7 4.9 4.7 4.2 3.5  CL 97 99 101 99 97  CO2 24 26 21 21 20   GLUCOSE 129* 285* 299* 397* 282*  BUN 41* 39* 34* 38* 43*  CREATININE 1.47* 1.43* 1.49* 1.49* 1.59*  CALCIUM 9.4 9.1 8.2* 7.8* 8.1*    ------------------------------------------------------------------------------------------------------------------ estimated creatinine clearance is 20.5 ml/min (by C-G formula based on Cr of 1.59). ------------------------------------------------------------------------------------------------------------------  Recent Labs  11/14/13 0422  HGBA1C 8.6*   ------------------------------------------------------------------------------------------------------------------ No results found for this basename: CHOL, HDL, LDLCALC, TRIG, CHOLHDL, LDLDIRECT,  in the last 72 hours ------------------------------------------------------------------------------------------------------------------ No results found for this basename: TSH, T4TOTAL, FREET3, T3FREE, THYROIDAB,  in the last 72 hours ------------------------------------------------------------------------------------------------------------------ No results found for this basename: VITAMINB12, FOLATE, FERRITIN, TIBC, IRON, RETICCTPCT,  in the last 72 hours  Coagulation profile  Recent Labs Lab 11/11/13 2135  INR 1.19    No results found for this basename: DDIMER,  in the last 72 hours  Cardiac Enzymes No results found for this basename: CK, CKMB, TROPONINI, MYOGLOBIN,  in the last 168 hours ------------------------------------------------------------------------------------------------------------------ No components found with this basename: POCBNP,

## 2013-11-15 NOTE — Progress Notes (Signed)
Inpatient Diabetes Program Recommendations  AACE/ADA: New Consensus Statement on Inpatient Glycemic Control (2013)  Target Ranges:  Prepandial:   less than 140 mg/dL      Peak postprandial:   less than 180 mg/dL (1-2 hours)      Critically ill patients:  140 - 180 mg/dL   Inpatient Diabetes Program Recommendations Insulin - Basal: consider increasing Lantus to 25 units  Thank you  Piedad Climes BSN, RN,CDE Inpatient Diabetes Coordinator [phone] (team pager)

## 2013-11-16 ENCOUNTER — Encounter (HOSPITAL_COMMUNITY): Payer: Self-pay | Admitting: Radiology

## 2013-11-16 ENCOUNTER — Inpatient Hospital Stay (HOSPITAL_COMMUNITY): Payer: Medicare Other

## 2013-11-16 DIAGNOSIS — G9340 Encephalopathy, unspecified: Secondary | ICD-10-CM

## 2013-11-16 LAB — AMMONIA: Ammonia: 24 umol/L (ref 11–60)

## 2013-11-16 LAB — CBC
HCT: 21.7 % — ABNORMAL LOW (ref 36.0–46.0)
Hemoglobin: 7.7 g/dL — ABNORMAL LOW (ref 12.0–15.0)
MCH: 32.2 pg (ref 26.0–34.0)
MCHC: 35.5 g/dL (ref 30.0–36.0)
MCV: 90.8 fL (ref 78.0–100.0)
RBC: 2.39 MIL/uL — ABNORMAL LOW (ref 3.87–5.11)
RDW: 14.2 % (ref 11.5–15.5)

## 2013-11-16 LAB — URINE CULTURE
Colony Count: >=100000
Culture: NO GROWTH

## 2013-11-16 LAB — GLUCOSE, CAPILLARY
Glucose-Capillary: 194 mg/dL — ABNORMAL HIGH (ref 70–99)
Glucose-Capillary: 242 mg/dL — ABNORMAL HIGH (ref 70–99)
Glucose-Capillary: 180 mg/dL — ABNORMAL HIGH (ref 70–99)

## 2013-11-16 LAB — PREPARE RBC (CROSSMATCH)

## 2013-11-16 MED ORDER — CEPHALEXIN 500 MG PO CAPS
500.0000 mg | ORAL_CAPSULE | Freq: Two times a day (BID) | ORAL | Status: DC
  Administered 2013-11-16 – 2013-11-17 (×3): 500 mg via ORAL
  Filled 2013-11-16 (×4): qty 1

## 2013-11-16 NOTE — Progress Notes (Signed)
PT Cancellation Note  Patient Details Name: Morgan Morris MRN: [id number] DOB: [birth year]   Cancelled Treatment:     Pt session held at this time due to RN's request.  States pt is very lethargic & difficult to arouse as well as Hgb low & she will be receiving blood today.  Will attempt back later today to see if pt more appropriate if time allows.      Verdell Face, [place] [phone] 11/16/2013

## 2013-11-16 NOTE — Progress Notes (Signed)
Covering Clinical Child psychotherapist (CSW) to remain following for placement.  Theresia Bough, MSW, LCSW [phone]

## 2013-11-16 NOTE — Progress Notes (Signed)
[facility]                                                                                Patient Demographics  Morgan Morris, is a 79 y.o. female, DOB - 1920/12/14, YQI:[id number]  Admit date - 11/11/2013   Admitting Physician Eduard Clos, MD  Outpatient Primary MD for the patient is Juline Patch, MD  LOS - 5   Chief Complaint  Patient presents with  . Fall      Interim history 79 year old female history of atrial fibrillation on Eliquis to experience follow her living facility. Patient states that she slipped and fell she was trying to the chair. She did not hit her head or lose consciousness. Orthopedics did see the patient and patient underwent open treatment of femoral fracture, proximal and neck and prosthetic replacement. After surgery patient remained stable. Her hemoglobin and hematocrit should you continue to steadily drop. Patient will be given one unit of packed red blood cells. She has also been experiencing some altered mental status. Ammonia, CT of the head, B12, folate, TSH levels are pending. Patient will likely need to go to nursing home for rehabilitation. Patient did have a urinary tract infection which was treated, repeat UA negative.  Assessment & Plan  Principal Problem:   Femoral neck fracture Active Problems:   Atrial fibrillation   CKD (chronic kidney disease), stage III   Diabetes mellitus  Right hip fracture status post mechanical fall  -POD4.  Ortho following. Continue pain control. -Patient will likely need rehabilitation.    Acute Encephalopathy -Possibly secondary to pain medications, rule out other source of infection -Patient did have urinary tract infection which was treated, repeat UA negative. -Will obtain CT of the head, ammonia level, TSH, B12, folate levels -Blood cultures show no growth at this time.  Atrial fibrillation  -Currently rate controlled with Lopressor and diltiazem.   -Continue Eliquis   Leukocytosis  secondary to urinary tract infection -Leukocytosis resolved -UA showed TNTC WBC and large leukocytes -Urine culture shows gram positive cocci, could be contaminant, placed on vancomycin. -Discontinue antibiotics at this time.  History of CHF -Currently euvolemic  -Continue home medications, strict Is and Os, daily weights, and fluid restriction.  Chronic kidney disease  -Creatinine appears to be at baseline will continue to monitor.  Hyperglycemia with Diabetes mellitus type 2 -Will continue to resistant insulin sliding scale and Lantus with CBGs. -Hemoglobin A1c 8.6, diabetes educator following -Nutrition consulted  Anemia  -Likely secondary to recent surgery -H/H 7.7/21.7, currently stable continue to monitor CBC  -Will transfuse 1 unit packed red blood cells.  Depression -Continue Seroquel and Zoloft  Code Status: Full  Family Communication: None at this time.  Disposition Plan: Admitted   Procedures  Open treatment of femoral fracture, proximal end, neck, prosthetic replacement  Consults   Orthopedics  DVT Prophylaxis  Eliquis  Lab Results  Component Value Date   PLT 161 11/16/2013    Medications  Scheduled Meds: . apixaban  2.5 mg Oral BID  . atorvastatin  10 mg Oral q1800  . cefTRIAXone (ROCEPHIN)  IV  1 g Intravenous Q24H  . Chlorhexidine Gluconate Cloth  6 each Topical Q0600  .  cholecalciferol  1,000 Units Oral Daily  . diltiazem  180 mg Oral Daily  . feeding supplement (GLUCERNA SHAKE)  237 mL Oral TID PC  . furosemide  20 mg Oral Q breakfast  . insulin aspart  0-20 Units Subcutaneous TID WC  . insulin aspart  0-5 Units Subcutaneous QHS  . insulin glargine  20 Units Subcutaneous QHS  . latanoprost  1 drop Both Eyes QHS  . metoprolol tartrate  25 mg Oral BID  . mupirocin ointment   Nasal BID  . QUEtiapine  12.5 mg Oral QHS  . senna  1 tablet Oral BID  . sertraline  50 mg Oral Daily  . timolol  1 drop Both Eyes Daily  . vancomycin  500 mg  Intravenous Q24H   Continuous Infusions: . sodium chloride 20 mL/hr at 11/12/13 0246  . sodium chloride 125 mL/hr at 11/16/13 0417   PRN Meds:.acetaminophen, acetaminophen, albuterol, albuterol, alum & mag hydroxide-simeth, fentaNYL, HYDROcodone-acetaminophen, menthol-cetylpyridinium, methocarbamol (ROBAXIN) IV, methocarbamol, metoCLOPramide (REGLAN) injection, metoCLOPramide, morphine injection, ondansetron (ZOFRAN) IV, ondansetron, oxyCODONE, phenol  Antibiotics    Anti-infectives   Start     Dose/Rate Route Frequency Ordered Stop   11/15/13 2000  vancomycin (VANCOCIN) IVPB 750 mg/150 ml premix  Status:  Discontinued     750 mg 150 mL/hr over 60 Minutes Intravenous Every 24 hours 11/14/13 1845 11/15/13 1125   11/15/13 2000  vancomycin (VANCOCIN) 500 mg in sodium chloride 0.9 % 100 mL IVPB     500 mg 100 mL/hr over 60 Minutes Intravenous Every 24 hours 11/15/13 1125     11/14/13 1900  vancomycin (VANCOCIN) IVPB 1000 mg/200 mL premix     1,000 mg 200 mL/hr over 60 Minutes Intravenous  Once 11/14/13 1845 11/14/13 2104   11/13/13 0600  clindamycin (CLEOCIN) IVPB 600 mg  Status:  Discontinued     600 mg 100 mL/hr over 30 Minutes Intravenous Every 6 hours 11/13/13 0403 11/13/13 1025   11/12/13 2315  clindamycin (CLEOCIN) IVPB 600 mg     600 mg 100 mL/hr over 30 Minutes Intravenous  Once 11/12/13 2310 11/13/13 0005   11/12/13 1400  cefTRIAXone (ROCEPHIN) 1 g in dextrose 5 % 50 mL IVPB     1 g 100 mL/hr over 30 Minutes Intravenous Every 24 hours 11/12/13 1221        Time Spent in minutes   30 minutes   Danielys Madry D.O. on 11/16/2013 at 10:04 AM  Between 7am to 7pm - Pager - [phone]  After 7pm go to [email or web address] - password [email or web address]  And look for the night coverage person covering for me after hours  [facility] Office  [phone]    Subjective:   Amory Zbikowski seen and examined today.  Patient appears to be confused this morning. Unable to answer  questions properly were appropriately.   Objective:   Filed Vitals:   11/15/13 2128 11/16/13 0000 11/16/13 0523 11/16/13 0540  BP: 128/54  122/65   Pulse: 75  80   Temp: 98.7 F (37.1 C)  98.5 F (36.9 C)   TempSrc: Oral  Oral   Resp: 18 18 18    Height:      Weight:    67.1 kg (147 lb 14.9 oz)  SpO2: 99% 98% 97%     Wt Readings from Last 3 Encounters:  11/16/13 67.1 kg (147 lb 14.9 oz)  11/16/13 67.1 kg (147 lb 14.9 oz)  08/17/13 59.512 kg (131 lb 3.2 oz)     Intake/Output  Summary (Last 24 hours) at 11/16/13 1004 Last data filed at 11/16/13 0611  Gross per 24 hour  Intake 2484.17 ml  Output   1150 ml  Net 1334.17 ml    Exam  General: Well developed, well nourished, NAD, appears stated age  HEENT: NCAT, PERRLA, EOMI, Anicteic Sclera, mucous membranes moist. No pharyngeal erythema or exudates  Neck: Supple, no JVD, no masses  Cardiovascular: S1 S2 auscultated, no rubs, murmurs or gallops. Regular rate and rhythm.  Respiratory: Clear to auscultation bilaterally with equal chest rise  Abdomen: Soft, nontender, nondistended, + bowel sounds  Extremities: warm dry without cyanosis clubbing or edema  Neuro: Awake and alert. However oriented to only self at this time.  Skin: Without rashes exudates or nodules.  Incision clean.     Data Review   Micro Results Recent Results (from the past 240 hour(s))  SURGICAL PCR SCREEN     Status: Abnormal   Collection Time    11/12/13  4:22 AM      Result Value Range Status   MRSA, PCR POSITIVE (*) NEGATIVE Final   Staphylococcus aureus POSITIVE (*) NEGATIVE Final   Comment:            The Xpert SA Assay (FDA     approved for NASAL specimens     in patients over 65 years of age),     is one component of     a comprehensive surveillance     program.  Test performance has     been validated by [employer] for patients greater     than or equal to 33 year old.     It is not intended     to diagnose infection nor  to     guide or monitor treatment.  URINE CULTURE     Status: None   Collection Time    11/12/13  9:47 AM      Result Value Range Status   Specimen Description URINE, CATHETERIZED   Final   Special Requests NONE   Final   Culture  Setup Time     Final   Value: 11/12/2013 11:19     Performed at [facility] Count     Final   Value: >=100,000 COLONIES/ML     Performed at [facility]   Culture     Final   Value: GRAM POSITIVE COCCI     Performed at [facility]   Report Status PENDING   Incomplete  URINE CULTURE     Status: None   Collection Time    11/14/13  6:46 PM      Result Value Range Status   Specimen Description URINE, CATHETERIZED   Final   Special Requests NONE   Final   Culture  Setup Time     Final   Value: 11/15/2013 00:11     Performed at [facility] Count     Final   Value: NO GROWTH     Performed at [facility]   Culture     Final   Value: NO GROWTH     Performed at [facility]   Report Status 11/16/2013 FINAL   Final  CULTURE, BLOOD (ROUTINE X 2)     Status: None   Collection Time    11/14/13  7:05 PM      Result Value Range Status   Specimen Description BLOOD RIGHT ARM  Final   Special Requests BOTTLES DRAWN AEROBIC ONLY [facility]   Final   Culture  Setup Time     Final   Value: 11/15/2013 00:10     Performed at [facility]   Culture     Final   Value:        BLOOD CULTURE RECEIVED NO GROWTH TO DATE CULTURE WILL BE HELD FOR 5 DAYS BEFORE ISSUING A FINAL NEGATIVE REPORT     Performed at [facility]   Report Status PENDING   Incomplete  CULTURE, BLOOD (ROUTINE X 2)     Status: None   Collection Time    11/14/13  7:14 PM      Result Value Range Status   Specimen Description BLOOD RIGHT ARM   Final   Special Requests BOTTLES DRAWN AEROBIC AND ANAEROBIC 10CC   Final   Culture  Setup Time     Final   Value: 11/15/2013 00:10     Performed at [facility]   Culture      Final   Value:        BLOOD CULTURE RECEIVED NO GROWTH TO DATE CULTURE WILL BE HELD FOR 5 DAYS BEFORE ISSUING A FINAL NEGATIVE REPORT     Performed at [facility]   Report Status PENDING   Incomplete    Radiology Reports Dg Chest 2 View  11/11/2013   CLINICAL DATA:  Fall.  EXAM: CHEST  2 VIEW  COMPARISON:  09/02/2013  FINDINGS: The lungs are adequately inflated without definite consolidation or effusion. There is mild stable cardiomegaly. Remainder of the exam is unchanged.  IMPRESSION: No definite acute cardiopulmonary disease.  Stable cardiomegaly.   Electronically Signed   By: Elberta Fortis M.D.   On: 11/11/2013 20:56   Dg Hip 1 View Right  11/11/2013   CLINICAL DATA:  Fall.  Right hip pain.  EXAM: RIGHT HIP - 1 VIEW  COMPARISON:  03/29/2012  FINDINGS: Acute subcapital right femoral neck fracture noted with varus angulation and moderate displacement.  IMPRESSION: 1. Acute right femoral neck fracture.   Electronically Signed   By: Herbie Baltimore M.D.   On: 11/11/2013 20:20   Dg Femur Right  11/11/2013   CLINICAL DATA:  Right hip fracture  EXAM: RIGHT FEMUR - 2 VIEW  COMPARISON:  Prior radiograph of the right hip performed earlier on the same day.  FINDINGS: Again seen is an acute subcapital right femoral neck fracture, stable in position and alignment relative to prior radiograph. No other fracture or dislocation seen involving the right femur. The right knee is grossly approximated. No soft tissue abnormality.  IMPRESSION: Unchanged right femoral neck fracture. No other acute fracture involving the right femur.   Electronically Signed   By: Rise Mu M.D.   On: 11/11/2013 23:06    CBC  Recent Labs Lab 11/11/13 2135 11/12/13 0551 11/13/13 0554 11/14/13 0422 11/15/13 0250 11/16/13 0910  WBC 14.5* 14.6* 16.3* 15.1* 15.8* 10.2  HGB 11.4* 12.1 9.8* 8.3* 7.9* 7.7*  HCT 31.9* 34.5* 28.3* 23.9* 22.8* 21.7*  PLT 150 156 136* 126* 148* 161  MCV 88.6 90.3 91.0 91.6  91.6 90.8  MCH 31.7 31.7 31.5 31.8 31.7 32.2  MCHC 35.7 35.1 34.6 34.7 34.6 35.5  RDW 13.3 13.9 14.2 14.4 14.3 14.2  LYMPHSABS 0.8  --   --   --   --   --   MONOABS 1.0  --   --   --   --   --  EOSABS 0.5  --   --   --   --   --   BASOSABS 0.1  --   --   --   --   --     Chemistries   Recent Labs Lab 11/11/13 2135 11/12/13 0551 11/13/13 0554 11/14/13 0422 11/15/13 0250  NA 134* 136 136 133* 132*  K 3.7 4.9 4.7 4.2 3.5  CL 97 99 101 99 97  CO2 24 26 21 21 20   GLUCOSE 129* 285* 299* 397* 282*  BUN 41* 39* 34* 38* 43*  CREATININE 1.47* 1.43* 1.49* 1.49* 1.59*  CALCIUM 9.4 9.1 8.2* 7.8* 8.1*   ------------------------------------------------------------------------------------------------------------------ estimated creatinine clearance is 20.7 ml/min (by C-G formula based on Cr of 1.59). ------------------------------------------------------------------------------------------------------------------  Recent Labs  11/14/13 0422  HGBA1C 8.6*   ------------------------------------------------------------------------------------------------------------------ No results found for this basename: CHOL, HDL, LDLCALC, TRIG, CHOLHDL, LDLDIRECT,  in the last 72 hours ------------------------------------------------------------------------------------------------------------------ No results found for this basename: TSH, T4TOTAL, FREET3, T3FREE, THYROIDAB,  in the last 72 hours ------------------------------------------------------------------------------------------------------------------ No results found for this basename: VITAMINB12, FOLATE, FERRITIN, TIBC, IRON, RETICCTPCT,  in the last 72 hours  Coagulation profile  Recent Labs Lab 11/11/13 2135  INR 1.19    No results found for this basename: DDIMER,  in the last 72 hours  Cardiac Enzymes No results found for this basename: CK, CKMB, TROPONINI, MYOGLOBIN,  in the last 168  hours ------------------------------------------------------------------------------------------------------------------ No components found with this basename: POCBNP,

## 2013-11-17 ENCOUNTER — Encounter (HOSPITAL_COMMUNITY): Payer: Self-pay | Admitting: Orthopaedic Surgery

## 2013-11-17 LAB — GLUCOSE, CAPILLARY
Glucose-Capillary: 161 mg/dL — ABNORMAL HIGH (ref 70–99)
Glucose-Capillary: 182 mg/dL — ABNORMAL HIGH (ref 70–99)

## 2013-11-17 MED ORDER — APIXABAN 2.5 MG PO TABS: 2.5000 mg | ORAL_TABLET | Freq: Two times a day (BID) | ORAL | Status: AC

## 2013-11-17 MED ORDER — SENNA 8.6 MG PO TABS: 1.0000 | ORAL_TABLET | Freq: Two times a day (BID) | ORAL | Status: DC

## 2013-11-17 MED ORDER — APIXABAN 2.5 MG PO TABS: 2.5000 mg | ORAL_TABLET | Freq: Two times a day (BID) | ORAL | Status: DC

## 2013-11-17 NOTE — Social Work (Signed)
Patient for d/c today to SNF bed at [facility]. Son and patient agreeable to this plan- plan transfer via EMS. Reece Levy, MSW, [facility] [phone]

## 2013-11-17 NOTE — Social Work (Signed)
Clinical Social Work Department CLINICAL SOCIAL WORK PLACEMENT NOTE 11/17/2013  Patient:  Morgan Morris, Morgan Morris  Account Number:  [id number] Admit date:  11/11/2013  Clinical Social Worker:  Jetta Lout, [place]  Date/time:  11/14/2013 04:40 PM  Clinical Social Work is seeking post-discharge placement for this patient at the following level of care:   SKILLED NURSING   (*CSW will update this form in Epic as items are completed)   11/14/2013  Patient/family provided with [facility] Department of Clinical Social Work's list of facilities offering this level of care within the geographic area requested by the patient (or if unable, by the patient's family).  11/14/2013  Patient/family informed of their freedom to choose among providers that offer the needed level of care, that participate in Medicare, Medicaid or managed care program needed by the patient, have an available bed and are willing to accept the patient.  11/14/2013  Patient/family informed of [facility]' ownership interest in [facility], as well as of the fact that they are under no obligation to receive care at this facility.  PASARR submitted to [employer] on  PASARR number received from [employer] on   FL2 transmitted to all facilities in geographic area requested by pt/family on  11/14/2013 FL2 transmitted to all facilities within larger geographic area on   Patient informed that his/her managed care company has contracts with or will negotiate with  certain facilities, including the following:     Patient/family informed of bed offers received:  11/16/2013 Patient chooses bed at [facility] Physician recommends and patient chooses bed at    Patient to be transferred to [facility] on  11/17/2013 Patient to be transferred to facility by ems  The following physician request were entered in Epic:   Additional Comments: Patient has existing PASARR.  Reece Levy, MSW,  [facility] [phone]

## 2013-11-17 NOTE — Care Management Utilization Note (Signed)
Utilization review completed. Celsa Nordahl, RN BSN 

## 2013-11-17 NOTE — Discharge Summary (Signed)
Physician Discharge Summary  Morgan Morris ZOX:[id number] DOB: [birth year] DOA: 11/11/2013  PCP: Juline Patch, MD  Admit date: 11/11/2013 Discharge date: 11/20/2013  Time spent: 30 minutes  Recommendations for Outpatient Follow-up:  1. Follow up with PCP in one week 2. Follow up with orthopedics as recommended 3. Follow up with BMP AND CBC in 1 to 2 days to check hemoglobin and electrolytes.   Discharge Diagnoses:  Principal Problem:   Femoral neck fracture Active Problems:   Atrial fibrillation   CKD (chronic kidney disease), stage III   Diabetes mellitus   Acute encephalopathy   Discharge Condition: imrpoved.   Diet recommendation: carb modified diet  Filed Weights   11/15/13 0537 11/16/13 0540 11/17/13 0500  Weight: 65.5 kg (144 lb 6.4 oz) 67.1 kg (147 lb 14.9 oz) 68.3 kg (150 lb 9.2 oz)    History of present illness:  79 year old female history of atrial fibrillation on Eliquis to experience follow her living facility. Patient states that she slipped and fell she was trying to the chair. She did not hit her head or lose consciousness. Orthopedics did see the patient and patient underwent open treatment of femoral fracture, proximal and neck and prosthetic replacement. After surgery patient remained stable. Her hemoglobin and hematocrit should you continue to steadily drop. Patient will be given one unit of packed red blood cells. She has also been experiencing some altered mental status. Ammonia, CT of the head, B12, folate, TSH levels are NEGATIVE and AMS resolved. . Patient will likely need to go to nursing home for rehabilitation. Patient did have a urinary tract infection which was treated, repeat UA negative.    Hospital Course:  Right hip fracture status post mechanical fall  -POD4.  Continue pain control.  -Patient will likely need rehabilitation.  Acute Encephalopathy  - resolved.  -Possibly secondary to pain medications, ruled out other source of infection   -Patient did have urinary tract infection which was treated, repeat UA and culture negative.  -CT of the head negative, ammonia, b12 and tsh within normal limits.  -Blood cultures show no growth at this time.  Atrial fibrillation  -Currently rate controlled with Lopressor and diltiazem.  -Continue Eliquis  Leukocytosis secondary to urinary tract infection  -Leukocytosis resolved  -UA showed TNTC WBC and large leukocytes  -Urine culture shows gram positive cocci, could be contaminant, placed on vancomycin. And completed the course with keflex.  History of CHF  -Currently euvolemic  -Continue home medications, strict Is and Os, daily weights, and fluid restriction.  Chronic kidney disease  -Creatinine appears to be at baseline will continue to monitor.  Hyperglycemia with Diabetes mellitus type 2  - resume SSI. -Hemoglobin A1c 8.6, diabetes educator following  -Nutrition consulted  Anemia  -Likely secondary to recent surgery  -H/H 7.7/21.7, currently stable continue to monitor CBC  - transfuseD 1 unit packed red blood cells.  Depression  -Continue Seroquel and Zoloft   Procedures: Open treatment of femoral fracture, proximal end, neck, prosthetic replacement   Consultations:  orthopedics  Discharge Exam: Filed Vitals:   11/17/13 1156  BP:   Pulse:   Temp:   Resp: 18   General: Well developed, well nourished, NAD, appears stated age Cardiovascular: S1 S2 auscultated, no rubs, murmurs or gallops. Regular rate and rhythm.  Respiratory: Clear to auscultation bilaterally  Abdomen: Soft, nontender, nondistended, + bowel sounds  Extremities: warm dry without cyanosis clubbing or edema  Neuro: Awake and alert, oriented to person and place .   Skin:  Without rashes exudates or nodules. Incision clean    Discharge Instructions      Discharge Orders   Future Orders Complete By Expires   Discharge instructions  As directed    Comments:     Follow up with PCP in one  week.  Follow up with ortho as recommended Please obtain cbc and bmp to check hemoglobin and electrolytes.   Increase activity slowly  As directed    Weight bearing as tolerated  As directed    Questions:     Laterality:     Extremity:         Medication List         albuterol 108 (90 BASE) MCG/ACT inhaler  Commonly known as:  PROVENTIL HFA;VENTOLIN HFA  Inhale 2 puffs into the lungs every 6 (six) hours as needed for wheezing.     albuterol (2.5 MG/3ML) 0.083% nebulizer solution  Commonly known as:  PROVENTIL  Take 2.5 mg by nebulization every 6 (six) hours as needed for wheezing.     apixaban 2.5 MG Tabs tablet  Commonly known as:  ELIQUIS  Take 1 tablet (2.5 mg total) by mouth 2 (two) times daily.     b complex vitamins tablet  Take 1 tablet by mouth daily at 12 noon.     BOOST DIABETIC Liqd  Take 1 Bottle by mouth 3 (three) times daily after meals.     cholecalciferol 1000 UNITS tablet  Commonly known as:  VITAMIN D  Take 1,000 Units by mouth daily.     diltiazem 180 MG 24 hr capsule  Commonly known as:  CARDIZEM CD  Take 1 capsule (180 mg total) by mouth daily.     furosemide 20 MG tablet  Commonly known as:  LASIX  Take 40 mg by mouth daily.     insulin glargine 100 UNIT/ML injection  Commonly known as:  LANTUS  Inject 0.2 mLs (20 Units total) into the skin at bedtime.     insulin regular 100 units/mL injection  Commonly known as:  NOVOLIN R,HUMULIN R  - Inject 3-20 Units into the skin 3 (three) times daily before meals. Sliding scale. See nursing home MAR.  - 121-150 = 3 units  - 151-200=5 units  - 201-250= 12 units  - 251-300= 17 units  - >300=20 units     latanoprost 0.005 % ophthalmic solution  Commonly known as:  XALATAN  Place 1 drop into both eyes at bedtime.     metoprolol tartrate 25 MG tablet  Commonly known as:  LOPRESSOR  Take 1 tablet (25 mg total) by mouth 2 (two) times daily.     OCUVITE PRESERVISION PO  Take 1 tablet by mouth  2 (two) times daily.     oxyCODONE 5 MG immediate release tablet  Commonly known as:  Oxy IR/ROXICODONE  Take 1-3 tablets (5-15 mg total) by mouth every 4 (four) hours as needed.     potassium chloride SA 20 MEQ tablet  Commonly known as:  K-DUR,KLOR-CON  Take 2 tablets (40 mEq total) by mouth daily.     pravastatin 40 MG tablet  Commonly known as:  PRAVACHOL  Take 40 mg by mouth daily.     QUEtiapine 25 MG tablet  Commonly known as:  SEROQUEL  Take 6.25 mg by mouth at bedtime.     senna 8.6 MG Tabs tablet  Commonly known as:  SENOKOT  Take 1 tablet (8.6 mg total) by mouth 2 (two) times daily.  sertraline 50 MG tablet  Commonly known as:  ZOLOFT  Take 50 mg by mouth daily.     spironolactone 25 MG tablet  Commonly known as:  ALDACTONE  Take 12.5 mg by mouth every morning.     timolol 0.5 % ophthalmic solution  Commonly known as:  TIMOPTIC  Place 1 drop into both eyes daily.       Allergies  Allergen Reactions  . Penicillins     unknown  . Sulfa Antibiotics     unknown   Follow-up Information   Follow up with Cheral Almas, MD In 2 weeks.   Specialty:  Orthopedic Surgery   Contact information:   [address] [place] [address] [phone]       Follow up with Juline Patch, MD In 1 week.   Specialty:  Internal Medicine   Contact information:   [address], [address] [place] [address] [phone]        The results of significant diagnostics from this hospitalization (including imaging, microbiology, ancillary and laboratory) are listed below for reference.    Significant Diagnostic Studies: Dg Chest 2 View  11/11/2013   CLINICAL DATA:  Fall.  EXAM: CHEST  2 VIEW  COMPARISON:  09/02/2013  FINDINGS: The lungs are adequately inflated without definite consolidation or effusion. There is mild stable cardiomegaly. Remainder of the exam is unchanged.  IMPRESSION: No definite acute cardiopulmonary disease.  Stable  cardiomegaly.   Electronically Signed   By: Elberta Fortis M.D.   On: 11/11/2013 20:56   Dg Hip 1 View Right  11/11/2013   CLINICAL DATA:  Fall.  Right hip pain.  EXAM: RIGHT HIP - 1 VIEW  COMPARISON:  03/29/2012  FINDINGS: Acute subcapital right femoral neck fracture noted with varus angulation and moderate displacement.  IMPRESSION: 1. Acute right femoral neck fracture.   Electronically Signed   By: Herbie Baltimore M.D.   On: 11/11/2013 20:20   Dg Femur Right  11/11/2013   CLINICAL DATA:  Right hip fracture  EXAM: RIGHT FEMUR - 2 VIEW  COMPARISON:  Prior radiograph of the right hip performed earlier on the same day.  FINDINGS: Again seen is an acute subcapital right femoral neck fracture, stable in position and alignment relative to prior radiograph. No other fracture or dislocation seen involving the right femur. The right knee is grossly approximated. No soft tissue abnormality.  IMPRESSION: Unchanged right femoral neck fracture. No other acute fracture involving the right femur.   Electronically Signed   By: Rise Mu M.D.   On: 11/11/2013 23:06   Ct Head Wo Contrast  11/16/2013   CLINICAL DATA:  Altered mental status.  EXAM: CT HEAD WITHOUT CONTRAST  TECHNIQUE: Contiguous axial images were obtained from the base of the skull through the vertex without intravenous contrast.  COMPARISON:  No recent prior.  FINDINGS: No mass. No hemorrhage. White matter changes noted most consistent with chronic white matter ischemia. Associated diffuse cerebral and cerebellar atrophy present. Ventriculomegaly present is consistent with a degree of atrophy present. Orbits unremarkable. Mucosal thickening noted in the sphenoid sinus. This could be from a mucous retention cyst or sinusitis. Mastoids are clear. Soft tissue prominence within the left external auditory canal, this is most likely cerumen however direct observation suggested for further evaluation. No acute bony abnormality identified.   IMPRESSION: 1. Diffuse cerebral atrophy and white matter changes suggesting chronic ischemia. 2. Sphenoid sinus mucous retention cyst versus sinusitis. 3. Soft tissue prominence within the left  external auditory canal, most likely cerumen, direct observation suggested.   Electronically Signed   By: Maisie Fus  Register   On: 11/16/2013 10:49   Dg Pelvis Portable  11/13/2013   CLINICAL DATA:  Of the right hip.  EXAM: PORTABLE PELVIS 1-2 VIEWS  COMPARISON:  The radiography from 2 days prior.  FINDINGS: Bipolar right hip hemiarthroplasty is located. No periprosthetic fracture. The imaged pelvic ring is intact. Mild left hip osteoarthritis.  IMPRESSION: Unremarkable recent right hip hemiarthroplasty.   Electronically Signed   By: Tiburcio Pea M.D.   On: 11/13/2013 04:06   Dg Chest Port 1 View  11/14/2013   CLINICAL DATA:  Altered mental status.  EXAM: PORTABLE CHEST - 1 VIEW  COMPARISON:  11/11/13  FINDINGS: Cardiomegaly remains stable. Pulmonary vascular congestion is unchanged. No evidence of acute infiltrate or edema. No evidence of pleural effusion. No mass or lymphadenopathy identified.  IMPRESSION: Stable cardiomegaly and pulmonary vascular congestion. No acute findings.   Electronically Signed   By: Myles Rosenthal M.D.   On: 11/14/2013 19:40    Microbiology: Recent Results (from the past 240 hour(s))  SURGICAL PCR SCREEN     Status: Abnormal   Collection Time    11/12/13  4:22 AM      Result Value Range Status   MRSA, PCR POSITIVE (*) NEGATIVE Final   Staphylococcus aureus POSITIVE (*) NEGATIVE Final   Comment:            The Xpert SA Assay (FDA     approved for NASAL specimens     in patients over 70 years of age),     is one component of     a comprehensive surveillance     program.  Test performance has     been validated by [employer] for patients greater     than or equal to 19 year old.     It is not intended     to diagnose infection nor to     guide or monitor treatment.   URINE CULTURE     Status: None   Collection Time    11/12/13  9:47 AM      Result Value Range Status   Specimen Description URINE, CATHETERIZED   Final   Special Requests NONE   Final   Culture  Setup Time     Final   Value: 11/12/2013 11:19     Performed at [facility] Count     Final   Value: >=100,000 COLONIES/ML     Performed at [facility]   Culture     Final   Value: STAPHYLOCOCCUS AUREUS     Performed at [facility]   Report Status 11/16/2013 FINAL   Final   Organism ID, Bacteria STAPHYLOCOCCUS AUREUS   Final  URINE CULTURE     Status: None   Collection Time    11/14/13  6:46 PM      Result Value Range Status   Specimen Description URINE, CATHETERIZED   Final   Special Requests NONE   Final   Culture  Setup Time     Final   Value: 11/15/2013 00:11     Performed at [facility] Count     Final   Value: NO GROWTH     Performed at [facility]   Culture     Final   Value: NO GROWTH     Performed  at [facility]   Report Status 11/16/2013 FINAL   Final  CULTURE, BLOOD (ROUTINE X 2)     Status: None   Collection Time    11/14/13  7:05 PM      Result Value Range Status   Specimen Description BLOOD RIGHT ARM   Final   Special Requests BOTTLES DRAWN AEROBIC ONLY 7CC   Final   Culture  Setup Time     Final   Value: 11/15/2013 00:10     Performed at [facility]   Culture     Final   Value:        BLOOD CULTURE RECEIVED NO GROWTH TO DATE CULTURE WILL BE HELD FOR 5 DAYS BEFORE ISSUING A FINAL NEGATIVE REPORT     Performed at [facility]   Report Status PENDING   Incomplete  CULTURE, BLOOD (ROUTINE X 2)     Status: None   Collection Time    11/14/13  7:14 PM      Result Value Range Status   Specimen Description BLOOD RIGHT ARM   Final   Special Requests BOTTLES DRAWN AEROBIC AND ANAEROBIC 10CC   Final   Culture  Setup Time     Final   Value: 11/15/2013 00:10     Performed at  [facility]   Culture     Final   Value:        BLOOD CULTURE RECEIVED NO GROWTH TO DATE CULTURE WILL BE HELD FOR 5 DAYS BEFORE ISSUING A FINAL NEGATIVE REPORT     Performed at [facility]   Report Status PENDING   Incomplete     Labs: Basic Metabolic Panel:  Recent Labs Lab 11/14/13 0422 11/15/13 0250  NA 133* 132*  K 4.2 3.5  CL 99 97  CO2 21 20  GLUCOSE 397* 282*  BUN 38* 43*  CREATININE 1.49* 1.59*  CALCIUM 7.8* 8.1*   Liver Function Tests: No results found for this basename: AST, ALT, ALKPHOS, BILITOT, PROT, ALBUMIN,  in the last 168 hours No results found for this basename: LIPASE, AMYLASE,  in the last 168 hours  Recent Labs Lab 11/16/13 1303  AMMONIA 24   CBC:  Recent Labs Lab 11/14/13 0422 11/15/13 0250 11/16/13 0910  WBC 15.1* 15.8* 10.2  HGB 8.3* 7.9* 7.7*  HCT 23.9* 22.8* 21.7*  MCV 91.6 91.6 90.8  PLT 126* 148* 161   Cardiac Enzymes: No results found for this basename: CKTOTAL, CKMB, CKMBINDEX, TROPONINI,  in the last 168 hours BNP: BNP (last 3 results)  Recent Labs  03/31/13 0425 05/28/13 1752 08/12/13 1826  PROBNP 2796.0* 16021.0* 5176.0*   CBG:  Recent Labs Lab 11/16/13 1202 11/16/13 1627 11/16/13 2146 11/17/13 0639 11/17/13 1130  GLUCAP 194* 242* 180* 161* 182*       Signed:  Luwanna Brossman  [facility] 11/20/2013, 6:14 PM

## 2013-11-18 LAB — TYPE AND SCREEN
ABO/RH(D): A POS
Antibody Screen: NEGATIVE
Unit division: 0
Unit division: 0

## 2013-11-21 LAB — CULTURE, BLOOD (ROUTINE X 2): Culture: NO GROWTH

## 2013-12-19 ENCOUNTER — Emergency Department (HOSPITAL_COMMUNITY): Payer: Medicare Other

## 2013-12-19 ENCOUNTER — Emergency Department (HOSPITAL_COMMUNITY)
Admission: EM | Admit: 2013-12-19 | Discharge: 2013-12-20 | Disposition: A | Discharge status: Skilled Nursing Facility | Payer: Medicare Other | Attending: Emergency Medicine | Admitting: Emergency Medicine

## 2013-12-19 ENCOUNTER — Encounter (HOSPITAL_COMMUNITY): Payer: Self-pay | Admitting: Emergency Medicine

## 2013-12-19 DIAGNOSIS — F3289 Other specified depressive episodes: Secondary | ICD-10-CM | POA: Insufficient documentation

## 2013-12-19 DIAGNOSIS — I10 Essential (primary) hypertension: Secondary | ICD-10-CM | POA: Insufficient documentation

## 2013-12-19 DIAGNOSIS — Y9210 Unspecified residential institution as the place of occurrence of the external cause: Secondary | ICD-10-CM | POA: Insufficient documentation

## 2013-12-19 DIAGNOSIS — X500XXA Overexertion from strenuous movement or load, initial encounter: Secondary | ICD-10-CM | POA: Insufficient documentation

## 2013-12-19 DIAGNOSIS — E119 Type 2 diabetes mellitus without complications: Secondary | ICD-10-CM | POA: Insufficient documentation

## 2013-12-19 DIAGNOSIS — Z8719 Personal history of other diseases of the digestive system: Secondary | ICD-10-CM | POA: Insufficient documentation

## 2013-12-19 DIAGNOSIS — Z96649 Presence of unspecified artificial hip joint: Secondary | ICD-10-CM

## 2013-12-19 DIAGNOSIS — Z880 Allergy status to penicillin: Secondary | ICD-10-CM | POA: Insufficient documentation

## 2013-12-19 DIAGNOSIS — Z794 Long term (current) use of insulin: Secondary | ICD-10-CM | POA: Insufficient documentation

## 2013-12-19 DIAGNOSIS — Z7902 Long term (current) use of antithrombotics/antiplatelets: Secondary | ICD-10-CM | POA: Insufficient documentation

## 2013-12-19 DIAGNOSIS — Y9389 Activity, other specified: Secondary | ICD-10-CM | POA: Insufficient documentation

## 2013-12-19 DIAGNOSIS — T84029A Dislocation of unspecified internal joint prosthesis, initial encounter: Secondary | ICD-10-CM | POA: Insufficient documentation

## 2013-12-19 DIAGNOSIS — I4891 Unspecified atrial fibrillation: Secondary | ICD-10-CM | POA: Insufficient documentation

## 2013-12-19 DIAGNOSIS — Z79899 Other long term (current) drug therapy: Secondary | ICD-10-CM | POA: Insufficient documentation

## 2013-12-19 DIAGNOSIS — I509 Heart failure, unspecified: Secondary | ICD-10-CM | POA: Insufficient documentation

## 2013-12-19 DIAGNOSIS — F329 Major depressive disorder, single episode, unspecified: Secondary | ICD-10-CM | POA: Insufficient documentation

## 2013-12-19 DIAGNOSIS — M129 Arthropathy, unspecified: Secondary | ICD-10-CM | POA: Insufficient documentation

## 2013-12-19 MED ORDER — SODIUM CHLORIDE 0.9 % IV SOLN
Freq: Once | INTRAVENOUS | Status: AC
  Administered 2013-12-19: 23:00:00 via INTRAVENOUS

## 2013-12-19 MED ORDER — FENTANYL CITRATE 0.05 MG/ML IJ SOLN
INTRAMUSCULAR | Status: AC
  Filled 2013-12-19: qty 2

## 2013-12-19 MED ORDER — ONDANSETRON HCL 4 MG/2ML IJ SOLN
4.0000 mg | Freq: Once | INTRAMUSCULAR | Status: AC
  Administered 2013-12-19: 4 mg via INTRAVENOUS
  Filled 2013-12-19: qty 2

## 2013-12-19 MED ORDER — MIDAZOLAM HCL 2 MG/2ML IJ SOLN
INTRAMUSCULAR | Status: AC
  Administered 2013-12-19: 2 mg
  Filled 2013-12-19: qty 2

## 2013-12-19 MED ORDER — MIDAZOLAM HCL 2 MG/2ML IJ SOLN
INTRAMUSCULAR | Status: AC
  Filled 2013-12-19: qty 2

## 2013-12-19 MED ORDER — FENTANYL CITRATE 0.05 MG/ML IJ SOLN
25.0000 ug | Freq: Once | INTRAMUSCULAR | Status: AC
  Administered 2013-12-19: 25 ug via INTRAVENOUS

## 2013-12-19 MED ORDER — MIDAZOLAM HCL 2 MG/2ML IJ SOLN: 5.0000 mg | Freq: Once | INTRAMUSCULAR | Status: DC

## 2013-12-19 NOTE — ED Provider Notes (Signed)
CSN: [id number]     Arrival date & time 12/19/13  2112 History   First MD Initiated Contact with Patient 12/19/13 2141     Chief Complaint  Patient presents with  . Dislocation   (Consider location/radiation/quality/duration/timing/severity/associated sxs/prior Treatment) HPI Comments: Pt is at nursing facility, had trie dto stand up from sitting when she was somewhat twisted and there was a pop in right hip with deformity and pain, no fall or direct trauma.  Plain film there reportedly shows disclocation and possibly fracture.  EMS gave 50 mcg of fentnayl PTA and pt feels somewhat improved.  She drank water about 1 hour ag, may have eaten a snack around 7 PM, she is not entirely sure.  Otherwise was feeling well.  She had a femoral neck fracture sustained in November and resulted in surgery by Dr. Roda Shutters with [facility].  Level 5 caveat due to pt's sensorium, somewhat confused after receiving IV fentanyl.    The history is provided by the patient and the EMS personnel.    Past Medical History  Diagnosis Date  . Hypertension   . Depression   . Erosive esophagitis 04/02/2012  . Mallory - Weiss tear 04/02/2012  . Dysrhythmia 02/24/2013    ATRIAL FIB WITH RVR  . Arthritis   . Atrial fibrillation   . CHF (congestive heart failure)   . Diabetes mellitus    Past Surgical History  Procedure Laterality Date  . Abdominal hysterectomy    . Appendectomy    . Cholecystectomy    . Esophagogastroduodenoscopy  03/31/2012    Procedure: ESOPHAGOGASTRODUODENOSCOPY (EGD);  Surgeon: Meryl Dare, MD,FACG;  Location: [facility];  Service: Endoscopy;  Laterality: N/A;  . Hip arthroplasty Right 11/12/2013    Procedure: RIGHT HIP HEMIARTHROPLASTY;  Surgeon: Cheral Almas, MD;  Location: [facility];  Service: Orthopedics;  Laterality: Right;   Family History  Problem Relation Age of Onset  . Cancer Mother    History  Substance Use Topics  . Smoking status: Never Smoker   . Smokeless tobacco: Never  Used  . Alcohol Use: No   OB History   Grav Para Term Preterm Abortions TAB SAB Ect Mult Living                 Review of Systems  Unable to perform ROS: Other    Allergies  Penicillins and Sulfa antibiotics  Home Medications   Current Outpatient Rx  Name  Route  Sig  Dispense  Refill  . albuterol (PROVENTIL HFA;VENTOLIN HFA) 108 (90 BASE) MCG/ACT inhaler   Inhalation   Inhale 2 puffs into the lungs every 6 (six) hours as needed for wheezing.   1 Inhaler   2   . albuterol (PROVENTIL) (2.5 MG/3ML) 0.083% nebulizer solution   Nebulization   Take 2.5 mg by nebulization every 6 (six) hours as needed for wheezing.         Marland Kitchen apixaban (ELIQUIS) 2.5 MG TABS tablet   Oral   Take 1 tablet (2.5 mg total) by mouth 2 (two) times daily.      0   . b complex vitamins tablet   Oral   Take 1 tablet by mouth daily at 12 noon.         . cholecalciferol (VITAMIN D) 1000 UNITS tablet   Oral   Take 1,000 Units by mouth daily.         Marland Kitchen diltiazem (CARDIZEM CD) 180 MG 24 hr capsule   Oral   Take  1 capsule (180 mg total) by mouth daily.   30 capsule   0   . furosemide (LASIX) 20 MG tablet   Oral   Take 40 mg by mouth daily.         . insulin glargine (LANTUS) 100 UNIT/ML injection   Subcutaneous   Inject 0.2 mLs (20 Units total) into the skin at bedtime.         . insulin regular (NOVOLIN R,HUMULIN R) 100 units/mL injection   Subcutaneous   Inject 3-20 Units into the skin 3 (three) times daily before meals. Sliding scale. See nursing home MAR. 121-150 = 3 units 151-200=5 units 201-250= 12 units 251-300= 17 units >300=20 units         . latanoprost (XALATAN) 0.005 % ophthalmic solution   Both Eyes   Place 1 drop into both eyes at bedtime.         Marland Kitchen loperamide (IMODIUM) 2 MG capsule   Oral   Take 4 mg by mouth as needed for diarrhea or loose stools.         . metoprolol tartrate (LOPRESSOR) 25 MG tablet   Oral   Take 1 tablet (25 mg total) by mouth 2  (two) times daily.   60 tablet   0   . Multiple Vitamins-Minerals (OCUVITE PRESERVISION PO)   Oral   Take 1 tablet by mouth 2 (two) times daily.         . Nutritional Supplements (BOOST DIABETIC) LIQD   Oral   Take 1 Bottle by mouth 3 (three) times daily after meals.   10 mL   0   . potassium chloride SA (K-DUR,KLOR-CON) 20 MEQ tablet   Oral   Take 2 tablets (40 mEq total) by mouth daily.         . pravastatin (PRAVACHOL) 40 MG tablet   Oral   Take 40 mg by mouth daily.         . QUEtiapine (SEROQUEL) 25 MG tablet   Oral   Take 6.25 mg by mouth at bedtime.         . sertraline (ZOLOFT) 50 MG tablet   Oral   Take 50 mg by mouth daily.          Marland Kitchen spironolactone (ALDACTONE) 25 MG tablet   Oral   Take 12.5 mg by mouth every morning.         . timolol (TIMOPTIC) 0.5 % ophthalmic solution   Both Eyes   Place 1 drop into both eyes daily.          BP 142/66  Pulse 79  Temp(Src) 97.5 F (36.4 C) (Oral)  Resp 17  SpO2 100% Physical Exam  Nursing note and vitals reviewed. Constitutional: She appears well-developed and well-nourished.  HENT:  Head: Normocephalic and atraumatic.  Eyes: Conjunctivae and EOM are normal.  Neck: Neck supple.  Cardiovascular: Normal rate, regular rhythm and intact distal pulses.   Pulses:      Dorsalis pedis pulses are 2+ on the right side.       Posterior tibial pulses are 1+ on the right side.  Pulmonary/Chest: Effort normal.  Abdominal: Soft. She exhibits no distension. There is no tenderness.  Musculoskeletal: She exhibits tenderness.       Right hip: She exhibits decreased range of motion, tenderness and deformity. She exhibits no laceration.  Normal plantar and dorsiflexion strength of right ankle, intact distal gross sensation, good color, normal cap refill, 2+ DP and 1+ PT on right.  Neurological: She is alert. She exhibits normal muscle tone. Coordination normal.  Skin: Skin is warm. No rash noted. No pallor.   Psychiatric: She has a normal mood and affect.    ED Course  ORTHOPEDIC INJURY TREATMENT Date/Time: 12/19/2013 11:54 PM Performed by: Lear Ng. Authorized by: Lear Ng Consent: Verbal consent obtained. written consent obtained. Risks and benefits: risks, benefits and alternatives were discussed Consent given by: patient Patient understanding: patient states understanding of the procedure being performed Patient consent: the patient's understanding of the procedure matches consent given Procedure consent: procedure consent matches procedure scheduled Required items: required blood products, implants, devices, and special equipment available Patient identity confirmed: verbally with patient and arm band Time out: Immediately prior to procedure a "time out" was called to verify the correct patient, procedure, equipment, support staff and site/side marked as required. Injury location: hip Location details: right hip Injury type: dislocation Dislocation type: anterior Spontaneous dislocation: yes Prosthesis: yes Pre-procedure neurovascular assessment: neurovascularly intact Pre-procedure distal perfusion: normal Pre-procedure neurological function: normal Pre-procedure range of motion: reduced Patient sedated: yes Sedation type: moderate (conscious) sedation Sedatives: fentanyl and midazolam Analgesia: fentanyl Sedation start date/time: 12/19/2013 11:50 PM Sedation end date/time: 12/20/2013 12:05 AM Vitals: Vital signs were monitored during sedation. Manipulation performed: yes Reduction method: traction and counter traction and abduction Immobilization: brace Post-procedure neurovascular assessment: post-procedure neurovascularly intact Post-procedure distal perfusion: normal Post-procedure range of motion: improved Patient tolerance: Patient tolerated the procedure well with no immediate complications. Comments: After sedation, pt had a brief period of apnea,  sats dropped to upper 80's immediatly improved with increase in O2 and jaw thrust maneuver.  Sats back to 100% afterwards.     (including critical care time)     Labs Review Labs Reviewed - No data to display Imaging Review Dg Hip Complete Right  12/19/2013   CLINICAL DATA:  Right hip pain.  EXAM: RIGHT HIP - COMPLETE 2+ VIEW  COMPARISON:  11/11/2013  FINDINGS: Since the previous study, there is been interval placement of a right hip hemiarthroplasty. There is superior and anterior dislocation of the prosthesis from the acetabulum. Small bone fragment adjacent to the superior acetabulum could represent a small displaced fracture. Pelvis and left hip appear otherwise intact. Degenerative changes in the spine.  IMPRESSION: Right hip hemiarthroplasty with superior and anterior dislocation.   Electronically Signed   By: Burman Nieves M.D.   On: 12/19/2013 22:39   Dg Hip Portable 1 View Right  12/20/2013   CLINICAL DATA:  Post reduction dislocation of the right hip prosthesis.  EXAM: PORTABLE RIGHT HIP - 1 VIEW 12/20/2013 0004 hr:  COMPARISON:  Right hip x-rays yesterday.  FINDINGS: Anatomic alignment of the right hip post reduction. The fracture fragment arising from the acetabulum identified on the earlier images is not visualized on the current image. There is no periprosthetic lucency.  IMPRESSION: Anatomic alignment of the right hip post reduction.   Electronically Signed   By: Hulan Saas M.D.   On: 12/20/2013 00:11    EKG Interpretation   None      Saturation on O2 by [place] is 97% and I interpret to be adequate   12:08 AM Sedation completed.  Brief post procedure hypoxemia improved with jaw manipulation.     12:16 AM Repeat film shows reduction.  Will be able to be discharged to home once more awake.  MDM   1. Dislocation of hip prosthesis, initial encounter     Pt with likely dislocation, possibly  fracture.  No direct trauma.  Will get plain films, keep NPO.  maintenance  IVF's and keep pain control for now.      After film, decision made to attempt reduction given continued pain, neurovascullaryl intact.  Patient agreed, verbal consent obtained.  Discussed risks and benefits.  Prophylactic zofran given since pt had eaten a small amount at 7 PM approx.  Will get post film afterwards and place knee immobilizer to prevent recurrence.  Pt will need to follow up closely with Dr. Roda Shutters with [facility] orthopedics.       Gavin Pound. Oletta Lamas, MD 12/20/13 1478

## 2013-12-19 NOTE — ED Notes (Signed)
According to EMS patient was at nursing facility and attempted to rise from a sitting position and there was a "pop" in the right hip. Mobile xray showed dislocation and fracture. fentanyl given by EMS prior to arrival.

## 2013-12-20 ENCOUNTER — Emergency Department (HOSPITAL_COMMUNITY): Payer: Medicare Other

## 2013-12-20 MED ORDER — HYDROCODONE-ACETAMINOPHEN 5-325 MG PO TABS: ORAL_TABLET | ORAL | Status: DC

## 2013-12-20 NOTE — Discharge Instructions (Signed)
 Closed Reduction for Artificial Hip Dislocation  Care After Please read the instructions outlined below. Refer to these instructions for the next few weeks. These discharge instructions provide you with general information on caring for yourself after your closed reduction. Your caregiver may also give you specific instructions. If you have any problems or questions after discharge, please call your caregiver. HOME CARE INSTRUCTIONS   You may resume a normal diet and activities as directed or allowed.  Only take over-the-counter or prescription medicines for pain, discomfort, or fever as directed by your caregiver.  Use crutches as instructed. When you are allowed full weight bearing, begin slowly. Reduce the activity if it is causing pain or discomfort.  If you have been given a brace, wear it as instructed.  Apply ice to the injured hip for 15-20 minutes, 03-04 times per day while awake for 2 days. Put the ice in a plastic bag and place a thin towel between the bag of ice and your cast.  Do range of motion exercises only as instructed by your caregiver. SEEK MEDICAL CARE IF:   You have swelling or tenderness of your calf or leg.  You develop shortness of breath.  You have increasing pain or discomfort in your hip which is not relieved with medicine.  You notice increasing swelling around the hip.  You have any numbness or tingling in your hip or leg.  You are not improving or are getting worse.  You have any other questions or concerns. SEEK IMMEDIATE MEDICAL CARE IF:   Your hip re-dislocates.  You develop a rash.  You have difficulty breathing.  You develop chest pain.  You develop any reaction or side effects to medicines given. MAKE SURE YOU:   Understand these instructions.  Will watch your condition.  Will get help right away if you are not doing well or get worse. Document Released: 06/28/2005 Document Revised: 03/02/2012 Document Reviewed:  12/09/2005 [facility] Patient Information 2014 [place], [place].   Narcotic and benzodiazepine use may cause drowsiness, slowed breathing or dependence.  Please use with caution and do not drive, operate machinery or watch young children alone while taking them.  Taking combinations of these medications or drinking alcohol will potentiate these effects.

## 2013-12-20 NOTE — Progress Notes (Signed)
Report called to receiving nurse [place] at [facility].

## 2013-12-20 NOTE — Progress Notes (Signed)
[facility] called for transport.  

## 2013-12-20 NOTE — ED Notes (Signed)
Vital signs stable but patient is still very heavily sedated at this time and has not returned to baseline.

## 2013-12-29 ENCOUNTER — Observation Stay (HOSPITAL_COMMUNITY)
Admission: EM | Admit: 2013-12-29 | Discharge: 2013-12-30 | Disposition: A | Discharge status: Skilled Nursing Facility | Payer: Medicare Other | Attending: Orthopaedic Surgery | Admitting: Orthopaedic Surgery

## 2013-12-29 ENCOUNTER — Encounter (HOSPITAL_COMMUNITY)
Admission: EM | Disposition: A | Discharge status: Skilled Nursing Facility | Payer: Self-pay | Source: Home / Self Care | Attending: Emergency Medicine

## 2013-12-29 ENCOUNTER — Emergency Department (HOSPITAL_COMMUNITY): Payer: Medicare Other

## 2013-12-29 ENCOUNTER — Emergency Department (HOSPITAL_COMMUNITY): Payer: Medicare Other | Admitting: Anesthesiology

## 2013-12-29 ENCOUNTER — Encounter (HOSPITAL_COMMUNITY): Payer: Self-pay | Admitting: Emergency Medicine

## 2013-12-29 ENCOUNTER — Encounter (HOSPITAL_COMMUNITY): Payer: Medicare Other | Admitting: Anesthesiology

## 2013-12-29 DIAGNOSIS — I509 Heart failure, unspecified: Secondary | ICD-10-CM | POA: Insufficient documentation

## 2013-12-29 DIAGNOSIS — Z79899 Other long term (current) drug therapy: Secondary | ICD-10-CM | POA: Insufficient documentation

## 2013-12-29 DIAGNOSIS — Z96649 Presence of unspecified artificial hip joint: Secondary | ICD-10-CM

## 2013-12-29 DIAGNOSIS — N183 Chronic kidney disease, stage 3 unspecified: Secondary | ICD-10-CM | POA: Diagnosis present

## 2013-12-29 DIAGNOSIS — I4891 Unspecified atrial fibrillation: Secondary | ICD-10-CM | POA: Diagnosis present

## 2013-12-29 DIAGNOSIS — X58XXXA Exposure to other specified factors, initial encounter: Secondary | ICD-10-CM | POA: Diagnosis not present

## 2013-12-29 DIAGNOSIS — F0390 Unspecified dementia without behavioral disturbance: Secondary | ICD-10-CM | POA: Insufficient documentation

## 2013-12-29 DIAGNOSIS — S73006A Unspecified dislocation of unspecified hip, initial encounter: Secondary | ICD-10-CM

## 2013-12-29 DIAGNOSIS — Z8659 Personal history of other mental and behavioral disorders: Secondary | ICD-10-CM

## 2013-12-29 DIAGNOSIS — R062 Wheezing: Secondary | ICD-10-CM

## 2013-12-29 DIAGNOSIS — T84029A Dislocation of unspecified internal joint prosthesis, initial encounter: Principal | ICD-10-CM | POA: Insufficient documentation

## 2013-12-29 DIAGNOSIS — IMO0002 Reserved for concepts with insufficient information to code with codable children: Secondary | ICD-10-CM

## 2013-12-29 DIAGNOSIS — I10 Essential (primary) hypertension: Secondary | ICD-10-CM | POA: Insufficient documentation

## 2013-12-29 DIAGNOSIS — E119 Type 2 diabetes mellitus without complications: Secondary | ICD-10-CM | POA: Diagnosis present

## 2013-12-29 DIAGNOSIS — R52 Pain, unspecified: Secondary | ICD-10-CM

## 2013-12-29 DIAGNOSIS — S73006S Unspecified dislocation of unspecified hip, sequela: Secondary | ICD-10-CM

## 2013-12-29 DIAGNOSIS — S73004A Unspecified dislocation of right hip, initial encounter: Secondary | ICD-10-CM

## 2013-12-29 HISTORY — PX: HIP CLOSED REDUCTION: SHX983

## 2013-12-29 LAB — CBC WITH DIFFERENTIAL/PLATELET
Basophils Absolute: 0 K/uL (ref 0.0–0.1)
Basophils Relative: 0 % (ref 0–1)
Eosinophils Absolute: 0 K/uL (ref 0.0–0.7)
Eosinophils Relative: 1 % (ref 0–5)
HCT: 30.5 % — ABNORMAL LOW (ref 36.0–46.0)
Hemoglobin: 10.2 g/dL — ABNORMAL LOW (ref 12.0–15.0)
Lymphocytes Relative: 9 % — ABNORMAL LOW (ref 12–46)
Lymphs Abs: 0.7 K/uL (ref 0.7–4.0)
MCH: 30.8 pg (ref 26.0–34.0)
MCHC: 33.4 g/dL (ref 30.0–36.0)
MCV: 92.1 fL (ref 78.0–100.0)
Monocytes Absolute: 0.8 K/uL (ref 0.1–1.0)
Monocytes Relative: 11 % (ref 3–12)
Neutro Abs: 5.8 K/uL (ref 1.7–7.7)
Neutrophils Relative %: 79 % — ABNORMAL HIGH (ref 43–77)
Platelets: 154 K/uL (ref 150–400)
RBC: 3.31 MIL/uL — ABNORMAL LOW (ref 3.87–5.11)
RDW: 14.7 % (ref 11.5–15.5)
WBC: 7.4 K/uL (ref 4.0–10.5)

## 2013-12-29 LAB — BASIC METABOLIC PANEL WITH GFR
BUN: 54 mg/dL — ABNORMAL HIGH (ref 6–23)
CO2: 24 meq/L (ref 19–32)
Calcium: 9 mg/dL (ref 8.4–10.5)
Chloride: 91 meq/L — ABNORMAL LOW (ref 96–112)
Creatinine, Ser: 1.62 mg/dL — ABNORMAL HIGH (ref 0.50–1.10)
GFR calc Af Amer: 31 mL/min — ABNORMAL LOW
GFR calc non Af Amer: 27 mL/min — ABNORMAL LOW
Glucose, Bld: 442 mg/dL — ABNORMAL HIGH (ref 70–99)
Potassium: 4.9 meq/L (ref 3.7–5.3)
Sodium: 131 meq/L — ABNORMAL LOW (ref 137–147)

## 2013-12-29 LAB — PRO B NATRIURETIC PEPTIDE: Pro B Natriuretic peptide (BNP): 6554 pg/mL — ABNORMAL HIGH (ref 0–450)

## 2013-12-29 LAB — TYPE AND SCREEN
ABO/RH(D): A POS
ANTIBODY SCREEN: NEGATIVE

## 2013-12-29 LAB — POCT I-STAT TROPONIN I: Troponin i, poc: 0.02 ng/mL (ref 0.00–0.08)

## 2013-12-29 LAB — PROTIME-INR
INR: 1.66 — ABNORMAL HIGH (ref 0.00–1.49)
Prothrombin Time: 19.1 seconds — ABNORMAL HIGH (ref 11.6–15.2)

## 2013-12-29 SURGERY — CLOSED REDUCTION, HIP
Anesthesia: General | Site: Hip | Laterality: Right

## 2013-12-29 MED ORDER — LACTATED RINGERS IV SOLN
INTRAVENOUS | Status: DC | PRN
  Administered 2013-12-29: 23:00:00 via INTRAVENOUS

## 2013-12-29 MED ORDER — IPRATROPIUM-ALBUTEROL 0.5-2.5 (3) MG/3ML IN SOLN
3.0000 mL | Freq: Once | RESPIRATORY_TRACT | Status: AC
  Administered 2013-12-29: 3 mL via RESPIRATORY_TRACT
  Filled 2013-12-29: qty 3

## 2013-12-29 MED ORDER — SUCCINYLCHOLINE CHLORIDE 20 MG/ML IJ SOLN
INTRAMUSCULAR | Status: DC | PRN
  Administered 2013-12-29: 40 mg via INTRAVENOUS

## 2013-12-29 MED ORDER — PROPOFOL 10 MG/ML IV BOLUS
INTRAVENOUS | Status: AC
  Filled 2013-12-29: qty 20

## 2013-12-29 MED ORDER — PROMETHAZINE HCL 25 MG/ML IJ SOLN: 6.2500 mg | INTRAMUSCULAR | Status: DC | PRN

## 2013-12-29 MED ORDER — FENTANYL CITRATE 0.05 MG/ML IJ SOLN
INTRAMUSCULAR | Status: AC
  Filled 2013-12-29: qty 2

## 2013-12-29 MED ORDER — INSULIN ASPART 100 UNIT/ML ~~LOC~~ SOLN
30.0000 [IU] | Freq: Once | SUBCUTANEOUS | Status: AC
Start: 2013-12-29 — End: 2013-12-29
  Administered 2013-12-29: 22:00:00 via SUBCUTANEOUS
  Filled 2013-12-29: qty 1

## 2013-12-29 MED ORDER — FENTANYL CITRATE 0.05 MG/ML IJ SOLN
25.0000 ug | INTRAMUSCULAR | Status: DC | PRN
  Administered 2013-12-29: 25 ug via INTRAVENOUS
  Administered 2013-12-29: 50 ug via INTRAVENOUS
  Administered 2013-12-30: 25 ug via INTRAVENOUS

## 2013-12-29 MED ORDER — PROPOFOL 10 MG/ML IV BOLUS
INTRAVENOUS | Status: DC | PRN
  Administered 2013-12-29: 150 mg via INTRAVENOUS

## 2013-12-29 SURGICAL SUPPLY — 2 items
PILLOW ABDUCTION HIP (SOFTGOODS) ×3 IMPLANT
TRAY FOLEY CATH 14FRSI W/METER (CATHETERS) ×3 IMPLANT

## 2013-12-29 NOTE — Anesthesia Preprocedure Evaluation (Signed)
Anesthesia Evaluation  Patient identified by MRN, date of birth, ID band Patient awake    Reviewed: Allergy & Precautions, H&P , NPO status , Patient's Chart, lab work & pertinent test results  Airway Mallampati: II TM Distance: >3 FB Neck ROM: Limited    Dental no notable dental hx.    Pulmonary neg pulmonary ROS,  breath sounds clear to auscultation  Pulmonary exam normal       Cardiovascular hypertension, Pt. on medications +CHF negative cardio ROS  + dysrhythmias Rhythm:Regular Rate:Normal     Neuro/Psych negative neurological ROS  negative psych ROS   GI/Hepatic Neg liver ROS, PUD,   Endo/Other  diabetes, Poorly Controlled  Renal/GU Renal InsufficiencyRenal disease  negative genitourinary   Musculoskeletal negative musculoskeletal ROS (+)   Abdominal   Peds negative pediatric ROS (+)  Hematology  (+) anemia ,   Anesthesia Other Findings   Reproductive/Obstetrics negative OB ROS                           Anesthesia Physical Anesthesia Plan  ASA: IV and emergent  Anesthesia Plan: General   Post-op Pain Management:    Induction: Intravenous  Airway Management Planned: LMA  Additional Equipment:   Intra-op Plan:   Post-operative Plan: Extubation in OR  Informed Consent: I have reviewed the patients History and Physical, chart, labs and discussed the procedure including the risks, benefits and alternatives for the proposed anesthesia with the patient or authorized representative who has indicated his/her understanding and acceptance.   Dental advisory given  Plan Discussed with: CRNA and Surgeon  Anesthesia Plan Comments:         Anesthesia Quick Evaluation

## 2013-12-29 NOTE — Transfer of Care (Signed)
Immediate Anesthesia Transfer of Care Note  Patient: Morgan Morris  Procedure(s) Performed: Procedure(s) (LRB): CLOSED REDUCTION HIP (Right)  Patient Location: PACU  Anesthesia Type: General  Level of Consciousness: sedated, patient cooperative and responds to stimulation  Airway & Oxygen Therapy: Patient Spontanous Breathing and Patient connected to face mask oxgen  Post-op Assessment: Report given to PACU RN and Post -op Vital signs reviewed and stable  Post vital signs: Reviewed and stable, mask case.   Complications: No apparent anesthesia complications

## 2013-12-29 NOTE — ED Notes (Signed)
Patient now becoming confused. She does not know where she is and is requiring repetition of information

## 2013-12-29 NOTE — ED Provider Notes (Signed)
CSN: [id number]     Arrival date & time 12/29/13  1637 History   First MD Initiated Contact with Patient 12/29/13 1655     Chief Complaint  Patient presents with  . Hip Pain    HPI Pt is complaining of pain in her right hip.  She does not recall any specific injury or fall.  Pt resides at a nursing facility.  They are not aware of any specific new injury.  Pt does have a history of prior hip dislocations.  The patient also has noticed trouble with coughing and congestion. Patient's states she started having cold symptoms over the last one or 2 days. She denies any fevers or chest pain.  Last meal was 3 hours ago. Past Medical History  Diagnosis Date  . Hypertension   . Depression   . Erosive esophagitis 04/02/2012  . Mallory - Weiss tear 04/02/2012  . Dysrhythmia 02/24/2013    ATRIAL FIB WITH RVR  . Arthritis   . Atrial fibrillation   . CHF (congestive heart failure)   . Diabetes mellitus    Past Surgical History  Procedure Laterality Date  . Abdominal hysterectomy    . Appendectomy    . Cholecystectomy    . Esophagogastroduodenoscopy  03/31/2012    Procedure: ESOPHAGOGASTRODUODENOSCOPY (EGD);  Surgeon: Meryl Dare, MD,FACG;  Location: [facility];  Service: Endoscopy;  Laterality: N/A;  . Hip arthroplasty Right 11/12/2013    Procedure: RIGHT HIP HEMIARTHROPLASTY;  Surgeon: Cheral Almas, MD;  Location: [facility] OR;  Service: Orthopedics;  Laterality: Right;   Family History  Problem Relation Age of Onset  . Cancer Mother    History  Substance Use Topics  . Smoking status: Never Smoker   . Smokeless tobacco: Never Used  . Alcohol Use: No   OB History   Grav Para Term Preterm Abortions TAB SAB Ect Mult Living                 Review of Systems  Respiratory: Positive for cough. Negative for shortness of breath.   Cardiovascular: Negative for chest pain.  All other systems reviewed and are negative.    Allergies  Penicillins and Sulfa antibiotics  Home Medications    Current Outpatient Rx  Name  Route  Sig  Dispense  Refill  . albuterol (PROVENTIL HFA;VENTOLIN HFA) 108 (90 BASE) MCG/ACT inhaler   Inhalation   Inhale 2 puffs into the lungs every 6 (six) hours as needed for wheezing.   1 Inhaler   2   . albuterol (PROVENTIL) (2.5 MG/3ML) 0.083% nebulizer solution   Nebulization   Take 2.5 mg by nebulization every 6 (six) hours as needed for wheezing.         Marland Kitchen apixaban (ELIQUIS) 2.5 MG TABS tablet   Oral   Take 1 tablet (2.5 mg total) by mouth 2 (two) times daily.      0   . b complex vitamins tablet   Oral   Take 1 tablet by mouth daily at 12 noon.         . cholecalciferol (VITAMIN D) 1000 UNITS tablet   Oral   Take 1,000 Units by mouth daily.         Marland Kitchen diltiazem (CARDIZEM CD) 180 MG 24 hr capsule   Oral   Take 1 capsule (180 mg total) by mouth daily.   30 capsule   0   . furosemide (LASIX) 20 MG tablet   Oral   Take 40 mg by mouth  daily.         Marland Kitchen. HYDROcodone-acetaminophen (NORCO/VICODIN) 5-325 MG per tablet   Oral   Take 1-2 tablets by mouth every 6 (six) hours as needed for moderate pain.         Marland Kitchen. insulin aspart (NOVOLOG) 100 UNIT/ML injection   Subcutaneous   Inject 3 Units into the skin 2 (two) times daily. With lunch and dinner         . latanoprost (XALATAN) 0.005 % ophthalmic solution   Both Eyes   Place 1 drop into both eyes at bedtime.         . metoprolol tartrate (LOPRESSOR) 25 MG tablet   Oral   Take 1 tablet (25 mg total) by mouth 2 (two) times daily.   60 tablet   0   . Multiple Vitamins-Minerals (OCUVITE PRESERVISION PO)   Oral   Take 1 tablet by mouth 2 (two) times daily.         Marland Kitchen. oxyCODONE (OXY IR/ROXICODONE) 5 MG immediate release tablet   Oral   Take 5-15 mg by mouth every 4 (four) hours as needed for moderate pain or severe pain.         . potassium chloride SA (K-DUR,KLOR-CON) 20 MEQ tablet   Oral   Take 2 tablets (40 mEq total) by mouth daily.         . pravastatin  (PRAVACHOL) 40 MG tablet   Oral   Take 40 mg by mouth daily.         . QUEtiapine (SEROQUEL) 25 MG tablet   Oral   Take 6.25 mg by mouth at bedtime.         . sertraline (ZOLOFT) 50 MG tablet   Oral   Take 50 mg by mouth daily.          Marland Kitchen. spironolactone (ALDACTONE) 25 MG tablet   Oral   Take 12.5 mg by mouth every morning.         . timolol (TIMOPTIC) 0.5 % ophthalmic solution   Both Eyes   Place 1 drop into both eyes daily.          BP 151/53  Pulse 79  Temp(Src) 97.9 F (36.6 C) (Oral)  Resp 15  SpO2 100% Physical Exam  Nursing note and vitals reviewed. Constitutional: She appears well-developed and well-nourished. No distress.  HENT:  Head: Normocephalic and atraumatic.  Right Ear: External ear normal.  Left Ear: External ear normal.  Eyes: Conjunctivae are normal. Right eye exhibits no discharge. Left eye exhibits no discharge. No scleral icterus.  Neck: Neck supple. No tracheal deviation present.  Cardiovascular: Normal rate, regular rhythm and intact distal pulses.   Pulmonary/Chest: Effort normal. No stridor. No respiratory distress. She has wheezes. She has no rales.  Abdominal: Soft. Bowel sounds are normal. She exhibits no distension. There is no tenderness. There is no rebound and no guarding.  Musculoskeletal: She exhibits no edema.       Right hip: She exhibits tenderness.  Shortened rle, pain with rom  Neurological: She is alert. She has normal strength. No sensory deficit. Cranial nerve deficit:  no gross defecits noted. She exhibits normal muscle tone. She displays no seizure activity. Coordination normal.  Skin: Skin is warm and dry. No rash noted.  Psychiatric: She has a normal mood and affect.    ED Course  Procedures (including critical care time) Labs Review Labs Reviewed  BASIC METABOLIC PANEL - Abnormal; Notable for the following:    Sodium  131 (*)    Chloride 91 (*)    Glucose, Bld 442 (*)    BUN 54 (*)    Creatinine, Ser 1.62  (*)    GFR calc non Af Amer 27 (*)    GFR calc Af Amer 31 (*)    All other components within normal limits  CBC WITH DIFFERENTIAL - Abnormal; Notable for the following:    RBC 3.31 (*)    Hemoglobin 10.2 (*)    HCT 30.5 (*)    Neutrophils Relative % 79 (*)    Lymphocytes Relative 9 (*)    All other components within normal limits  PROTIME-INR - Abnormal; Notable for the following:    Prothrombin Time 19.1 (*)    INR 1.66 (*)    All other components within normal limits  PRO B NATRIURETIC PEPTIDE - Abnormal; Notable for the following:    Pro B Natriuretic peptide (BNP) 6554.0 (*)    All other components within normal limits  POCT I-STAT TROPONIN I  TYPE AND SCREEN   Imaging Review Dg Chest 1 View  12/29/2013   CLINICAL DATA:  Pain post trauma  EXAM: CHEST - 1 VIEW  COMPARISON:  November 14, 2013  FINDINGS: There is no edema or consolidation. Heart is mildly enlarged with normal pulmonary vascularity. No adenopathy. No pneumothorax. No bone lesions. Calcification is noted in the right breast.  IMPRESSION: No edema or consolidation. No pneumothorax. Stable calcification right breast.   Electronically Signed   By: Bretta Bang M.D.   On: 12/29/2013 18:19   Dg Hip Complete Right  12/29/2013   CLINICAL DATA:  Fall  EXAM: RIGHT HIP - COMPLETE 2+ VIEW  COMPARISON:  None.  FINDINGS: Right hip hemiarthroplasty is noted. There is complete superior dislocation of the femoral components with respect to the acetabulum.  IMPRESSION: Right hip hemiarthroplasty dislocation.   Electronically Signed   By: Maryclare Bean M.D.   On: 12/29/2013 18:18    EKG Interpretation   None      Medications  ipratropium-albuterol (DUONEB) 0.5-2.5 (3) MG/3ML nebulizer solution 3 mL (3 mLs Nebulization Given 12/29/13 1811)    MDM   1. Hip dislocation, right, initial encounter   2. Pain   3. Wheezing    Patient presents to the ED with a recurrent hip dislocation. The patient previously had her dislocation reduced  in the emergency department. However, the patient also presented to the emergency department with cough and wheezing. These other system complaints makes her a higher risk patient for sedation.  I do not think she would be an appropriate candidate for sedation in the ED.  Labs are not significantly changed from baseline.  Chronic renal insufficiency . Elevated BNP but CXR is normal and this bnp is lower than previously. Doubt CHF exacerbation.   Consulted with Dr Roda Shutters who will see the patient in the ED Regarding her dislocation.  I spoke to the patient about her results but she seems confused and became angry, I question dementia.  Pt did not recall that I spoken to her previously and has asked to leave.  I had to inform her about her dislocation again.    Celene Kras, MD 12/29/13 [phone]

## 2013-12-29 NOTE — ED Notes (Signed)
Bed: WA01 Expected date:  Expected time:  Means of arrival:  Comments: EMS 

## 2013-12-29 NOTE — ED Notes (Signed)
Per EMS patient complaining of right hip pain. Patient from [facility]. Right hip with shortening internal rotation. Staff unaware of any traumatic injury. Patient has expiratory wheezing--EMS gave albuterol. Also has cough.  O2 sat 94% on RA, 100% on nebulizer. 132/76, 74

## 2013-12-29 NOTE — Op Note (Signed)
Date of surgery: 12/29/2013  Preoperative diagnosis: Dislocation of right prosthetic hip  Postoperative diagnosis: Same  Procedure: Closed reduction of right prosthetic hip  Anesthesia: General  Surgeon: Glee ArvinMichael Rayhan Groleau, M.D.  Indications for procedure: The patient is a 80 year old female who has sustained her second hip dislocation within the last 2 weeks do to noncompliance with posterior hip precautions. She sustained this dislocation earlier today at her nursing facility. She was evaluated in the ER by orthopedics and was deemed to be appropriate for closed reduction in the operating room under general anesthesia given the fact that the previous closed reduction resulted in hypoxia in the ER.  Her son was able to provide consent for the procedure.  Description of procedure: The patient was identified in the preoperative holding area. The procedural site and procedure were confirmed with the patient and marked by the surgeon. She was brought back to the operating room. General anesthesia was induced and for muscle relaxant was administered. Gentle hip flexion and internal rotation and traction was used to relocate the hip. X-rays were taken to confirm reduction. The patient was placed in an abduction pillow. She awoke from anesthesia uneventfully. She was transferred to the PACU in stable condition.  Disposition: The patient will be admitted overnight. She will be on strict bed rest with the abduction pillow. She will be fitted for an abduction brace in the morning. She will be weightbearing as tolerated in the abduction brace. She will get up with physical therapy. Her discharge will be pending physical therapy.  Mayra ReelN. Michael Evelise Reine, MD [facility] [phone] 9:19 PM

## 2013-12-29 NOTE — ED Notes (Signed)
Pt hx of sundowning and is currently confused.  Unable to obtain consent at this time but son who is POA will be here in about to give consent.

## 2013-12-29 NOTE — ED Notes (Signed)
Patient has obvious shortening of right leg and rotation is internal

## 2013-12-29 NOTE — Anesthesia Postprocedure Evaluation (Signed)
  Anesthesia Post-op Note  Patient: Reece LeaderBetty G Shell  Procedure(s) Performed: Procedure(s) (LRB): CLOSED REDUCTION HIP (Right)  Patient Location: PACU  Anesthesia Type: General  Level of Consciousness: awake and alert   Airway and Oxygen Therapy: Patient Spontanous Breathing  Post-op Pain: mild  Post-op Assessment: Post-op Vital signs reviewed, Patient's Cardiovascular Status Stable, Respiratory Function Stable, Patent Airway and No signs of Nausea or vomiting  Last Vitals:  Filed Vitals:   12/29/13 2037  BP: 127/105  Pulse:   Temp:   Resp: 15    Post-op Vital Signs: stable   Complications: No apparent anesthesia complications

## 2013-12-29 NOTE — H&P (Signed)
ORTHOPAEDIC CONSULTATION  REQUESTING PHYSICIAN: Celene KrasJon R Knapp, MD  Chief Complaint: Right prosthetic hip dislocation  HPI: Morgan Morris is a 80 y.o. female who complains of right hip pain s/p being noncompliant with posterior hip precautions at her facility.  She dislocated the same hip approximately 1-2 weeks ago that was close reduced in the ER.  She denies any other complaints.  She has mild dementia at baseline.  Past Medical History  Diagnosis Date  . Hypertension   . Depression   . Erosive esophagitis 04/02/2012  . Mallory - Weiss tear 04/02/2012  . Dysrhythmia 02/24/2013    ATRIAL FIB WITH RVR  . Arthritis   . Atrial fibrillation   . CHF (congestive heart failure)   . Diabetes mellitus    Past Surgical History  Procedure Laterality Date  . Abdominal hysterectomy    . Appendectomy    . Cholecystectomy    . Esophagogastroduodenoscopy  03/31/2012    Procedure: ESOPHAGOGASTRODUODENOSCOPY (EGD);  Surgeon: Meryl DareMalcolm T Stark, MD,FACG;  Location[facility];  Service: Endoscopy;  Laterality: N/A;  . Hip arthroplasty Right 11/12/2013    Procedure: RIGHT HIP HEMIARTHROPLASTY;  Surgeon: Cheral AlmasNaiping Michael Komal Stangelo, MD;  Location: [facility] OR;  Service: Orthopedics;  Laterality: Right;   History   Social History  . Marital Status: Single    Spouse Name: N/A    Number of Children: N/A  . Years of Education: N/A   Social History Main Topics  . Smoking status: Never Smoker   . Smokeless tobacco: Never Used  . Alcohol Use: No  . Drug Use: No  . Sexual Activity: No   Other Topics Concern  . None   Social History Narrative  . None   Family History  Problem Relation Age of Onset  . Cancer Mother    Allergies  Allergen Reactions  . Penicillins     unknown  . Sulfa Antibiotics     unknown   Prior to Admission medications   Medication Sig Start Date End Date Taking? Authorizing Provider  albuterol (PROVENTIL HFA;VENTOLIN HFA) 108 (90 BASE) MCG/ACT inhaler Inhale 2 puffs into the  lungs every 6 (six) hours as needed for wheezing. 02/27/13  Yes Richarda OverlieNayana Abrol, MD  albuterol (PROVENTIL) (2.5 MG/3ML) 0.083% nebulizer solution Take 2.5 mg by nebulization every 6 (six) hours as needed for wheezing.   Yes Historical Provider, MD  apixaban (ELIQUIS) 2.5 MG TABS tablet Take 1 tablet (2.5 mg total) by mouth 2 (two) times daily. 11/17/13  Yes Kathlen ModyVijaya Akula, MD  b complex vitamins tablet Take 1 tablet by mouth daily at 12 noon.   Yes Historical Provider, MD  cholecalciferol (VITAMIN D) 1000 UNITS tablet Take 1,000 Units by mouth daily.   Yes Historical Provider, MD  diltiazem (CARDIZEM CD) 180 MG 24 hr capsule Take 1 capsule (180 mg total) by mouth daily. 06/02/13  Yes Elease EtienneAnand D Hongalgi, MD  furosemide (LASIX) 20 MG tablet Take 40 mg by mouth daily.   Yes Historical Provider, MD  HYDROcodone-acetaminophen (NORCO/VICODIN) 5-325 MG per tablet Take 1-2 tablets by mouth every 6 (six) hours as needed for moderate pain.   Yes Historical Provider, MD  insulin aspart (NOVOLOG) 100 UNIT/ML injection Inject 3 Units into the skin 2 (two) times daily. With lunch and dinner   Yes Historical Provider, MD  latanoprost (XALATAN) 0.005 % ophthalmic solution Place 1 drop into both eyes at bedtime.   Yes Historical Provider, MD  metoprolol tartrate (LOPRESSOR) 25 MG tablet Take 1 tablet (25  mg total) by mouth 2 (two) times daily. 08/17/13  Yes Clydia Llano, MD  Multiple Vitamins-Minerals (OCUVITE PRESERVISION PO) Take 1 tablet by mouth 2 (two) times daily.   Yes Historical Provider, MD  oxyCODONE (OXY IR/ROXICODONE) 5 MG immediate release tablet Take 5-15 mg by mouth every 4 (four) hours as needed for moderate pain or severe pain.   Yes Historical Provider, MD  potassium chloride SA (K-DUR,KLOR-CON) 20 MEQ tablet Take 2 tablets (40 mEq total) by mouth daily. 04/01/13  Yes Zannie Cove, MD  pravastatin (PRAVACHOL) 40 MG tablet Take 40 mg by mouth daily.   Yes Historical Provider, MD  QUEtiapine (SEROQUEL) 25 MG  tablet Take 6.25 mg by mouth at bedtime.   Yes Historical Provider, MD  sertraline (ZOLOFT) 50 MG tablet Take 50 mg by mouth daily.    Yes Historical Provider, MD  spironolactone (ALDACTONE) 25 MG tablet Take 12.5 mg by mouth every morning.   Yes Historical Provider, MD  timolol (TIMOPTIC) 0.5 % ophthalmic solution Place 1 drop into both eyes daily.   Yes Historical Provider, MD   Dg Chest 1 View  12/29/2013   CLINICAL DATA:  Pain post trauma  EXAM: CHEST - 1 VIEW  COMPARISON:  November 14, 2013  FINDINGS: There is no edema or consolidation. Heart is mildly enlarged with normal pulmonary vascularity. No adenopathy. No pneumothorax. No bone lesions. Calcification is noted in the right breast.  IMPRESSION: No edema or consolidation. No pneumothorax. Stable calcification right breast.   Electronically Signed   By: Bretta Bang M.D.   On: 12/29/2013 18:19   Dg Hip Complete Right  12/29/2013   CLINICAL DATA:  Fall  EXAM: RIGHT HIP - COMPLETE 2+ VIEW  COMPARISON:  None.  FINDINGS: Right hip hemiarthroplasty is noted. There is complete superior dislocation of the femoral components with respect to the acetabulum.  IMPRESSION: Right hip hemiarthroplasty dislocation.   Electronically Signed   By: Maryclare Bean M.D.   On: 12/29/2013 18:18    Positive ROS: All other systems have been reviewed and were otherwise negative with the exception of those mentioned in the HPI and as above.  Physical Exam: General: Alert, no acute distress Cardiovascular: No pedal edema Respiratory: No cyanosis, no use of accessory musculature GI: No organomegaly, abdomen is soft and non-tender Skin: No lesions in the area of chief complaint Neurologic: Sensation intact distally Psychiatric: Patient is competent for consent with normal mood and affect Lymphatic: No axillary or cervical lymphadenopathy  MUSCULOSKELETAL:  RLE  - leg held in IR and is shortened - +DP/SP/T nerve function - foot NVI  Assessment: Recurrent right  hip hemiarthroplasty dislocation  Plan: - will take to OR for close reduction under general anesthesia - will place in abduction pillow and bed rest - will fit patient with abduction brace given this is her 2nd dislocation and her difficulty with complying with posterior hip precautions - NPO  - will admit over night and up with PT in the am and d/c when able  Thank you for the consult and the opportunity to see Ms. Kirchoff  N. Glee Arvin, MD [facility] [phone] 7:30 PM

## 2013-12-29 NOTE — H&P (Signed)
PCP:   Juline Patch, MD   Chief Complaint:  Consult for medical management help  HPI: 80 yo female s/p closed reduction of displacement of rt hip tonight has h/o afib, dementia, dm doing well post op in pacu.  Asked to help with medical management postop.  She is comfortable right now, mildly sedated.  No complaints.  She does not recall what happened, but she thinks she fell.  Pt with dementia.  From snf.  Review of Systems:  Positive and negative as per HPI otherwise all other systems are negative  Past Medical History: Past Medical History  Diagnosis Date  . Hypertension   . Depression   . Erosive esophagitis 04/02/2012  . Mallory - Weiss tear 04/02/2012  . Dysrhythmia 02/24/2013    ATRIAL FIB WITH RVR  . Arthritis   . Atrial fibrillation   . CHF (congestive heart failure)   . Diabetes mellitus    Past Surgical History  Procedure Laterality Date  . Abdominal hysterectomy    . Appendectomy    . Cholecystectomy    . Esophagogastroduodenoscopy  03/31/2012    Procedure: ESOPHAGOGASTRODUODENOSCOPY (EGD);  Surgeon: Meryl Dare, MD,FACG;  Location: [facility];  Service: Endoscopy;  Laterality: N/A;  . Hip arthroplasty Right 11/12/2013    Procedure: RIGHT HIP HEMIARTHROPLASTY;  Surgeon: Cheral Almas, MD;  Location: [facility] OR;  Service: Orthopedics;  Laterality: Right;    Medications: Prior to Admission medications   Medication Sig Start Date End Date Taking? Authorizing Provider  albuterol (PROVENTIL HFA;VENTOLIN HFA) 108 (90 BASE) MCG/ACT inhaler Inhale 2 puffs into the lungs every 6 (six) hours as needed for wheezing. 02/27/13  Yes Richarda Overlie, MD  albuterol (PROVENTIL) (2.5 MG/3ML) 0.083% nebulizer solution Take 2.5 mg by nebulization every 6 (six) hours as needed for wheezing.   Yes Historical Provider, MD  apixaban (ELIQUIS) 2.5 MG TABS tablet Take 1 tablet (2.5 mg total) by mouth 2 (two) times daily. 11/17/13  Yes Kathlen Mody, MD  b complex vitamins tablet Take 1  tablet by mouth daily at 12 noon.   Yes Historical Provider, MD  cholecalciferol (VITAMIN D) 1000 UNITS tablet Take 1,000 Units by mouth daily.   Yes Historical Provider, MD  diltiazem (CARDIZEM CD) 180 MG 24 hr capsule Take 1 capsule (180 mg total) by mouth daily. 06/02/13  Yes Elease Etienne, MD  furosemide (LASIX) 20 MG tablet Take 40 mg by mouth daily.   Yes Historical Provider, MD  HYDROcodone-acetaminophen (NORCO/VICODIN) 5-325 MG per tablet Take 1-2 tablets by mouth every 6 (six) hours as needed for moderate pain.   Yes Historical Provider, MD  insulin aspart (NOVOLOG) 100 UNIT/ML injection Inject 3 Units into the skin 2 (two) times daily. With lunch and dinner   Yes Historical Provider, MD  latanoprost (XALATAN) 0.005 % ophthalmic solution Place 1 drop into both eyes at bedtime.   Yes Historical Provider, MD  metoprolol tartrate (LOPRESSOR) 25 MG tablet Take 1 tablet (25 mg total) by mouth 2 (two) times daily. 08/17/13  Yes Clydia Llano, MD  Multiple Vitamins-Minerals (OCUVITE PRESERVISION PO) Take 1 tablet by mouth 2 (two) times daily.   Yes Historical Provider, MD  oxyCODONE (OXY IR/ROXICODONE) 5 MG immediate release tablet Take 5-15 mg by mouth every 4 (four) hours as needed for moderate pain or severe pain.   Yes Historical Provider, MD  potassium chloride SA (K-DUR,KLOR-CON) 20 MEQ tablet Take 2 tablets (40 mEq total) by mouth daily. 04/01/13  Yes Zannie Cove, MD  pravastatin (  PRAVACHOL) 40 MG tablet Take 40 mg by mouth daily.   Yes Historical Provider, MD  QUEtiapine (SEROQUEL) 25 MG tablet Take 6.25 mg by mouth at bedtime.   Yes Historical Provider, MD  sertraline (ZOLOFT) 50 MG tablet Take 50 mg by mouth daily.    Yes Historical Provider, MD  spironolactone (ALDACTONE) 25 MG tablet Take 12.5 mg by mouth every morning.   Yes Historical Provider, MD  timolol (TIMOPTIC) 0.5 % ophthalmic solution Place 1 drop into both eyes daily.   Yes Historical Provider, MD    Allergies:    Allergies  Allergen Reactions  . Penicillins     unknown  . Sulfa Antibiotics     unknown    Social History:  reports that she has never smoked. She has never used smokeless tobacco. She reports that she does not drink alcohol or use illicit drugs.  Family History: Family History  Problem Relation Age of Onset  . Cancer Mother     Physical Exam: Filed Vitals:   12/29/13 1645 12/29/13 1812 12/29/13 1902 12/29/13 2037  BP: 141/46  151/53 127/105  Pulse: 70  79   Temp: 97.8 F (36.6 C)  97.9 F (36.6 C)   TempSrc: Oral  Oral   Resp: 14  15 15   SpO2: 98% 96% 100% 99%   General appearance: alert, cooperative and no distress Head: Normocephalic, without obvious abnormality, atraumatic Eyes: negative Nose: Nares normal. Septum midline. Mucosa normal. No drainage or sinus tenderness. Neck: no JVD and supple, symmetrical, trachea midline Lungs: clear to auscultation bilaterally Heart: regular rate and rhythm, S1, S2 normal, no murmur, click, rub or gallop Abdomen: soft, non-tender; bowel sounds normal; no masses,  no organomegaly Extremities: extremities normal, atraumatic, no cyanosis or edema Pulses: 2+ and symmetric Skin: Skin color, texture, turgor normal. No rashes or lesions Neurologic: Grossly normal    Labs on Admission:   Recent Labs  12/29/13 1745  NA 131*  K 4.9  CL 91*  CO2 24  GLUCOSE 442*  BUN 54*  CREATININE 1.62*  CALCIUM 9.0    Recent Labs  12/29/13 1745  WBC 7.4  NEUTROABS 5.8  HGB 10.2*  HCT 30.5*  MCV 92.1  PLT 154   Radiological Exams on Admission: Dg Chest 1 View  12/29/2013   CLINICAL DATA:  Pain post trauma  EXAM: CHEST - 1 VIEW  COMPARISON:  November 14, 2013  FINDINGS: There is no edema or consolidation. Heart is mildly enlarged with normal pulmonary vascularity. No adenopathy. No pneumothorax. No bone lesions. Calcification is noted in the right breast.  IMPRESSION: No edema or consolidation. No pneumothorax. Stable  calcification right breast.   Electronically Signed   By: Bretta Bang M.D.   On: 12/29/2013 18:19   Dg Hip Complete Right  12/29/2013   CLINICAL DATA:  Fall  EXAM: RIGHT HIP - COMPLETE 2+ VIEW  COMPARISON:  None.  FINDINGS: Right hip hemiarthroplasty is noted. There is complete superior dislocation of the femoral components with respect to the acetabulum.  IMPRESSION: Right hip hemiarthroplasty dislocation.   Electronically Signed   By: Maryclare Bean M.D.   On: 12/29/2013 18:18    Assessment/Plan  80 yo female s/p reduction of rt hip displacement  Principal Problem:   Diabetes mellitus  Agree with ssi  Active Problems:   Atrial fibrillation rate controlled   CKD (chronic kidney disease), stage III stable   History of dementia stable   Recurrent dislocation of hip joint prosthesis  Will  follow along with you.  At this point, chronic medical issues appear stable except glucose control.  She has received 30 units of reg insulin tonight.  Sugar already down to mid 200 from over 400.  No further coverage tonight.  Will follow am labs.   Stacie Knutzen A 12/29/2013, 11:10 PM

## 2013-12-30 ENCOUNTER — Encounter (HOSPITAL_COMMUNITY): Payer: Self-pay | Admitting: Orthopaedic Surgery

## 2013-12-30 LAB — GLUCOSE, CAPILLARY
GLUCOSE-CAPILLARY: 293 mg/dL — AB (ref 70–99)
GLUCOSE-CAPILLARY: 34 mg/dL — AB (ref 70–99)
GLUCOSE-CAPILLARY: 34 mg/dL — AB (ref 70–99)
GLUCOSE-CAPILLARY: 54 mg/dL — AB (ref 70–99)
Glucose-Capillary: 96 mg/dL (ref 70–99)
Glucose-Capillary: 317 mg/dL — ABNORMAL HIGH (ref 70–99)
Glucose-Capillary: 304 mg/dL — ABNORMAL HIGH (ref 70–99)

## 2013-12-30 LAB — MRSA PCR SCREENING: MRSA by PCR: NEGATIVE

## 2013-12-30 MED ORDER — GLUCERNA SHAKE PO LIQD
237.0000 mL | Freq: Three times a day (TID) | ORAL | Status: DC
  Administered 2013-12-30 (×2): 237 mL via ORAL
  Filled 2013-12-30 (×3): qty 237

## 2013-12-30 MED ORDER — VITAMIN D3 25 MCG (1000 UNIT) PO TABS
1000.0000 [IU] | ORAL_TABLET | Freq: Every day | ORAL | Status: DC
  Administered 2013-12-30: 11:00:00 1000 [IU] via ORAL
  Filled 2013-12-30: qty 1

## 2013-12-30 MED ORDER — LATANOPROST 0.005 % OP SOLN
1.0000 [drp] | Freq: Every day | OPHTHALMIC | Status: DC
  Filled 2013-12-30: qty 2.5

## 2013-12-30 MED ORDER — ONDANSETRON HCL 4 MG PO TABS: 4.0000 mg | ORAL_TABLET | Freq: Four times a day (QID) | ORAL | Status: DC | PRN

## 2013-12-30 MED ORDER — ALBUTEROL SULFATE (2.5 MG/3ML) 0.083% IN NEBU: 2.5000 mg | INHALATION_SOLUTION | Freq: Four times a day (QID) | RESPIRATORY_TRACT | Status: DC | PRN

## 2013-12-30 MED ORDER — INSULIN ASPART 100 UNIT/ML ~~LOC~~ SOLN
0.0000 [IU] | Freq: Three times a day (TID) | SUBCUTANEOUS | Status: DC
  Administered 2013-12-30: 11 [IU] via SUBCUTANEOUS

## 2013-12-30 MED ORDER — METOCLOPRAMIDE HCL 10 MG PO TABS: 5.0000 mg | ORAL_TABLET | Freq: Three times a day (TID) | ORAL | Status: DC | PRN

## 2013-12-30 MED ORDER — ACETAMINOPHEN 500 MG PO TABS
1000.0000 mg | ORAL_TABLET | Freq: Four times a day (QID) | ORAL | Status: DC | PRN
  Administered 2013-12-30: 1000 mg via ORAL
  Filled 2013-12-30: qty 2

## 2013-12-30 MED ORDER — ALBUTEROL SULFATE HFA 108 (90 BASE) MCG/ACT IN AERS: 2.0000 | INHALATION_SPRAY | Freq: Four times a day (QID) | RESPIRATORY_TRACT | Status: DC | PRN

## 2013-12-30 MED ORDER — TIMOLOL MALEATE 0.5 % OP SOLN
1.0000 [drp] | Freq: Every day | OPHTHALMIC | Status: DC
  Administered 2013-12-30: 1 [drp] via OPHTHALMIC
  Filled 2013-12-30: qty 5

## 2013-12-30 MED ORDER — APIXABAN 2.5 MG PO TABS
2.5000 mg | ORAL_TABLET | Freq: Two times a day (BID) | ORAL | Status: DC
  Administered 2013-12-30: 11:00:00 2.5 mg via ORAL
  Filled 2013-12-30 (×2): qty 1

## 2013-12-30 MED ORDER — MORPHINE SULFATE 10 MG/ML IJ SOLN: 1.0000 mg | INTRAMUSCULAR | Status: DC | PRN

## 2013-12-30 MED ORDER — SENNA 8.6 MG PO TABS
1.0000 | ORAL_TABLET | Freq: Two times a day (BID) | ORAL | Status: DC
  Administered 2013-12-30: 8.6 mg via ORAL

## 2013-12-30 MED ORDER — DIPHENHYDRAMINE HCL 12.5 MG/5ML PO ELIX: 25.0000 mg | ORAL_SOLUTION | ORAL | Status: DC | PRN

## 2013-12-30 MED ORDER — ACETAMINOPHEN 500 MG PO TABS: 500.0000 mg | ORAL_TABLET | Freq: Four times a day (QID) | ORAL | Status: DC | PRN

## 2013-12-30 MED ORDER — HYDROCODONE-ACETAMINOPHEN 5-325 MG PO TABS: 1.0000 | ORAL_TABLET | ORAL | Status: DC | PRN

## 2013-12-30 MED ORDER — OXYCODONE HCL 5 MG PO TABS: 5.0000 mg | ORAL_TABLET | ORAL | Status: DC | PRN

## 2013-12-30 MED ORDER — ONDANSETRON HCL 4 MG/2ML IJ SOLN: 4.0000 mg | Freq: Four times a day (QID) | INTRAMUSCULAR | Status: DC | PRN

## 2013-12-30 MED ORDER — SERTRALINE HCL 50 MG PO TABS
50.0000 mg | ORAL_TABLET | Freq: Every day | ORAL | Status: DC
  Administered 2013-12-30: 11:00:00 50 mg via ORAL
  Filled 2013-12-30: qty 1

## 2013-12-30 MED ORDER — QUETIAPINE FUMARATE 25 MG PO TABS
6.2500 mg | ORAL_TABLET | Freq: Every day | ORAL | Status: DC
  Filled 2013-12-30: qty 0.5

## 2013-12-30 MED ORDER — SODIUM CHLORIDE 0.9 % IV SOLN
INTRAVENOUS | Status: DC
  Administered 2013-12-30: 02:00:00 via INTRAVENOUS

## 2013-12-30 MED ORDER — DILTIAZEM HCL ER COATED BEADS 180 MG PO CP24
180.0000 mg | ORAL_CAPSULE | Freq: Every day | ORAL | Status: DC
  Administered 2013-12-30: 180 mg via ORAL
  Filled 2013-12-30: qty 1

## 2013-12-30 MED ORDER — GLUCOSE 40 % PO GEL
ORAL | Status: AC
  Administered 2013-12-30: 08:00:00
  Filled 2013-12-30: qty 1

## 2013-12-30 MED ORDER — INSULIN ASPART 100 UNIT/ML ~~LOC~~ SOLN: 0.0000 [IU] | Freq: Every day | SUBCUTANEOUS | Status: DC

## 2013-12-30 MED ORDER — SPIRONOLACTONE 12.5 MG HALF TABLET
12.5000 mg | ORAL_TABLET | Freq: Every morning | ORAL | Status: DC
  Administered 2013-12-30: 12.5 mg via ORAL
  Filled 2013-12-30: qty 1

## 2013-12-30 MED ORDER — GLUCOSE 40 % PO GEL: 1.0000 | ORAL | Status: DC | PRN

## 2013-12-30 MED ORDER — INSULIN ASPART 100 UNIT/ML ~~LOC~~ SOLN
3.0000 [IU] | Freq: Two times a day (BID) | SUBCUTANEOUS | Status: DC
  Administered 2013-12-30: 3 [IU] via SUBCUTANEOUS

## 2013-12-30 MED ORDER — POTASSIUM CHLORIDE CRYS ER 20 MEQ PO TBCR
40.0000 meq | EXTENDED_RELEASE_TABLET | Freq: Every day | ORAL | Status: DC
  Administered 2013-12-30: 11:00:00 40 meq via ORAL
  Filled 2013-12-30: qty 2

## 2013-12-30 MED ORDER — FUROSEMIDE 40 MG PO TABS
40.0000 mg | ORAL_TABLET | Freq: Every day | ORAL | Status: DC
  Administered 2013-12-30: 40 mg via ORAL
  Filled 2013-12-30: qty 1

## 2013-12-30 MED ORDER — METOPROLOL TARTRATE 25 MG PO TABS
25.0000 mg | ORAL_TABLET | Freq: Two times a day (BID) | ORAL | Status: DC
  Administered 2013-12-30: 25 mg via ORAL
  Filled 2013-12-30 (×2): qty 1

## 2013-12-30 MED ORDER — METOCLOPRAMIDE HCL 5 MG/ML IJ SOLN: 5.0000 mg | Freq: Three times a day (TID) | INTRAMUSCULAR | Status: DC | PRN

## 2013-12-30 NOTE — Care Management Note (Signed)
    Page 1 of 1   12/30/2013     1:08:29 PM   CARE MANAGEMENT NOTE 12/30/2013  Patient:  [name],Tena G   Account Number:  [id number]  Date Initiated:  12/30/2013  Documentation initiated by:  Colleen CanMANNING,Janissa Bertram  Subjective/Objective Assessment:   dx hip pain; closed reduction of rt prostethic  hip     Action/Plan:   Plans  are for patient to return to SNF, after she is fitted with abduction brace and has physical therapy   Anticipated DC Date:  12/30/2013   Anticipated DC Plan:  SKILLED NURSING FACILITY  In-house referral  Clinical Social Worker      DC Planning Services  CM consult      Choice offered to / List presented to:             Status of service:  Completed, signed off Medicare Important Message given?   (If response is "NO", the following Medicare IM given date fields will be blank) Date Medicare IM given:   Date Additional Medicare IM given:    Discharge Disposition:    Per UR Regulation:    If discussed at Long Length of Stay Meetings, dates discussed:    Comments:

## 2013-12-30 NOTE — Evaluation (Signed)
Physical Therapy Evaluation Patient Details Name: Morgan Morris MRN: [id number] DOB: 12/01/1920 Today's Date: 12/30/2013 Time: 4696-2952 PT Time Calculation (min): 23 min  PT Assessment / Plan / Recommendation History of Present Illness  80 y.o.. year old female with previous Right Hip Hemiarthroplasy on 11/12/13 and admitted for closed L hip reduction.  Clinical Impression  Pt admitted with above. Pt currently with functional limitations due to the deficits listed below (see PT Problem List).  Pt will benefit from skilled PT to increase their independence and safety with mobility to allow discharge to the venue listed below.  Pt with hip abductor brace on in bed and able to stand tolerate, tolerated well, however pt did not wish to attempt ambulation today.  Per son, plan is to return to [facility].     PT Assessment  Patient needs continued PT services    Follow Up Recommendations  SNF    Does the patient have the potential to tolerate intense rehabilitation      Barriers to Discharge        Equipment Recommendations  None recommended by PT    Recommendations for Other Services     Frequency Min 5X/week    Precautions / Restrictions Precautions Precautions: Fall;Posterior Hip Required Braces or Orthoses: Other Brace/Splint Other Brace/Splint: hip abduction brace Restrictions Other Position/Activity Restrictions: WBAT R LE   Pertinent Vitals/Pain Pt reports R hip pain with mobility however tolerable with standing and none at rest.      Mobility  Bed Mobility Overal bed mobility: Needs Assistance Bed Mobility: Supine to Sit;Sit to Supine Supine to sit: Mod assist Sit to supine: Mod assist General bed mobility comments: verbal cues for technique, pt wearing hip abduction brace, assist for trunk and R LE to sitting and then assist for LEs onto bed Transfers Overall transfer level: Needs assistance Equipment used: Rolling walker (2 wheeled) Transfers: Sit to/from  Stand Sit to Stand: Min assist General transfer comment: verbal cues for technique and hip precautions, assist to rise and control descent Ambulation/Gait Ambulation/Gait assistance:  (pt declined any further mobility then standing today)    Exercises     PT Diagnosis: Difficulty walking;Acute pain  PT Problem List: Decreased strength;Decreased mobility;Decreased knowledge of use of DME;Decreased knowledge of precautions;Pain;Decreased balance PT Treatment Interventions: DME instruction;Gait training;Functional mobility training;Therapeutic activities;Therapeutic exercise;Patient/family education     PT Goals(Current goals can be found in the care plan section) Acute Rehab PT Goals Patient Stated Goal: return to [facility] PT Goal Formulation: With patient Time For Goal Achievement: 01/06/14 Potential to Achieve Goals: Good  Visit Information  Last PT Received On: 12/30/13 Assistance Needed: +2 History of Present Illness: 80 y.o.. year old female with previous Right Hip Hemiarthroplasy on 11/12/13 and admitted for closed L hip reduction.       Prior Functioning  Home Living Family/patient expects to be discharged to:: Skilled nursing facility Additional Comments: pt from assisted living.  h/o dementia.  STM deficits.   Prior Function Level of Independence: Needs assistance Gait / Transfers Assistance Needed: independent with RW prior to R hip hemi Communication Communication: HOH    Cognition  Cognition Arousal/Alertness: Awake/alert Behavior During Therapy: WFL for tasks assessed/performed Overall Cognitive Status: History of cognitive impairments - at baseline Memory: Decreased recall of precautions;Decreased short-term memory    Extremity/Trunk Assessment Lower Extremity Assessment Lower Extremity Assessment: RLE deficits/detail RLE Deficits / Details: assist for mobility, maintained hip abduction brace   Balance    End of Session PT - End  of Session Equipment  Utilized During Treatment: Other (comment) (hip abduction brace) Activity Tolerance: Patient tolerated treatment well Patient left: in bed;with call bell/phone within reach  GP     Seddrick Flax,KATHrine E 12/30/2013, 1:57 PM Zenovia JarredKati Brendy Ficek, PT, DPT 12/30/2013 Pager: [phone]

## 2013-12-30 NOTE — Progress Notes (Signed)
Physical Therapy Treatment Note   12/30/13 1600  PT Visit Information  Last PT Received On 12/30/13  Assistance Needed +1  History of Present Illness 80 y.o.. year old female with previous Right Hip Hemiarthroplasy on 11/12/13 and admitted for closed L hip reduction.  PT Time Calculation  PT Start Time 1543  PT Stop Time 1558  PT Time Calculation (min) 15 min  Subjective Data  Subjective "I'm tired."  Pt assisted to [facility] to void then assisted back to bed.  Pt to d/c back to SNF today.  Patient Stated Goal return to [facility]  Precautions  Precautions Fall;Posterior Hip  Precaution Comments reviewed precautions with mobility, pt requires constant cues  Required Braces or Orthoses Other Brace/Splint  Other Brace/Splint hip abduction brace  Restrictions  Other Position/Activity Restrictions WBAT R LE  Cognition  Arousal/Alertness Awake/alert  Behavior During Therapy WFL for tasks assessed/performed  Overall Cognitive Status History of cognitive impairments - at baseline  Memory Decreased recall of precautions;Decreased short-term memory  Bed Mobility  Overal bed mobility Needs Assistance  Bed Mobility Supine to Sit;Sit to Supine  Supine to sit Mod assist  Sit to supine Mod assist  General bed mobility comments verbal cues for technique, pt prefers to move slowly  Transfers  Overall transfer level Needs assistance  Equipment used Rolling walker (2 wheeled)  Transfers Sit to/from BJ'sStand;Stand Pivot Transfers  Sit to Stand Min assist  Stand pivot transfers Min assist  General transfer comment verbal cues for technique and hip precautions,assist to rise and control descent, utilized nsg tech to assist with pericare, pt [facility] then a few steps back to bed  PT - End of Session  Equipment Utilized During Treatment Other (comment) (hip abduction brace)  Activity Tolerance Patient tolerated treatment well  Patient left in bed;with call bell/phone within reach   PT - Assessment/Plan  PT Plan Current plan remains appropriate  PT Frequency Min 5X/week  Follow Up Recommendations SNF  PT equipment None recommended by PT  PT Goal Progression  Progress towards PT goals Progressing toward goals  PT General Charges  $$ ACUTE PT VISIT 1 Procedure  PT Treatments  $Therapeutic Activity 8-22 mins   Zenovia JarredKati Vitoria Conyer, PT, DPT 12/30/2013 Pager: [phone]

## 2013-12-30 NOTE — Progress Notes (Addendum)
[facility] PROGRESS NOTE  Morgan Morris ZOX:[id number] DOB: 05-Jun-1920 DOA: 12/29/2013 PCP: Juline PatchPANG,RICHARD, MD  Assessment/Plan: Diabetes mellitus - Cont with ssi - Recent a1c of over 8 in 11/14 - Currently with scheduled pre-meal insulin alone - If glucose remains poorly controlled, may consider adding long-acting insulin - Would follow up with PCP VERY SOON after discharge, within one week Atrial fibrillation  -rate controlled  -cont eliquis CKD (chronic kidney disease), stage III stable  History of dementia stable  Recurrent dislocation of hip joint prosthesis  Code Status: Full Family Communication: Pt in room (indicate person spoken with, relationship, and if by phone, the number) Disposition Plan: Pending  HPI/Subjective: No acute events noted overnight  Objective: Filed Vitals:   12/30/13 0115 12/30/13 0215 12/30/13 0415 12/30/13 0605  BP: 117/71 123/74 131/82 135/75  Pulse: 76 73 73 74  Temp: 98.3 F (36.8 C) 97.9 F (36.6 C) 97.6 F (36.4 C) 97.6 F (36.4 C)  TempSrc:  Oral Oral Oral  Resp: 13 20 20 20   Height: 5' (1.524 m)     Weight: 61.372 kg (135 lb 4.8 oz)     SpO2: 99% 100% 98% 96%    Intake/Output Summary (Last 24 hours) at 12/30/13 0814 Last data filed at 12/30/13 0605  Gross per 24 hour  Intake 791.67 ml  Output    600 ml  Net 191.67 ml   Filed Weights   12/30/13 0115  Weight: 61.372 kg (135 lb 4.8 oz)    Exam:   General:  Awake, in nad  Cardiovascular: regular, s1, s2  Respiratory: normal resp effort no wheezing  Abdomen: soft, nondistended  Musculoskeletal: perfused, no clubbing   Data Reviewed: Basic Metabolic Panel:  Recent Labs Lab 12/29/13 1745  NA 131*  K 4.9  CL 91*  CO2 24  GLUCOSE 442*  BUN 54*  CREATININE 1.62*  CALCIUM 9.0   Liver Function Tests: No results found for this basename: AST, ALT, ALKPHOS, BILITOT, PROT, ALBUMIN,  in the last 168 hours No results found for this basename: LIPASE, AMYLASE,   in the last 168 hours No results found for this basename: AMMONIA,  in the last 168 hours CBC:  Recent Labs Lab 12/29/13 1745  WBC 7.4  NEUTROABS 5.8  HGB 10.2*  HCT 30.5*  MCV 92.1  PLT 154   Cardiac Enzymes: No results found for this basename: CKTOTAL, CKMB, CKMBINDEX, TROPONINI,  in the last 168 hours BNP (last 3 results)  Recent Labs  05/28/13 1752 08/12/13 1826 12/29/13 1745  PROBNP 16021.0* 5176.0* 6554.0*   CBG:  Recent Labs Lab 12/29/13 2341  GLUCAP 293*    No results found for this or any previous visit (from the past 240 hour(s)).   Studies: Dg Chest 1 View  12/29/2013   CLINICAL DATA:  Pain post trauma  EXAM: CHEST - 1 VIEW  COMPARISON:  November 14, 2013  FINDINGS: There is no edema or consolidation. Heart is mildly enlarged with normal pulmonary vascularity. No adenopathy. No pneumothorax. No bone lesions. Calcification is noted in the right breast.  IMPRESSION: No edema or consolidation. No pneumothorax. Stable calcification right breast.   Electronically Signed   By: Bretta BangWilliam  Woodruff M.D.   On: 12/29/2013 18:19   Dg Hip Complete Right  12/29/2013   CLINICAL DATA:  Reduction of recent dislocated total hip prosthesis  EXAM: RIGHT HIP - COMPLETE 2+ VIEW  COMPARISON:  Study obtained earlier in the day  FINDINGS: Frontal and lateral views were obtained. The  previously noted dislocated total hip prosthesis on the right has been successfully reduced. Currently there is no fracture or dislocation. Osteoarthritic change is noted in the right sacroiliac joint.  IMPRESSION: Successful reduction of right total hip prosthesis dislocation. Currently no fracture or dislocation.   Electronically Signed   By: Bretta Bang M.D.   On: 12/29/2013 23:19   Dg Hip Complete Right  12/29/2013   CLINICAL DATA:  Fall  EXAM: RIGHT HIP - COMPLETE 2+ VIEW  COMPARISON:  None.  FINDINGS: Right hip hemiarthroplasty is noted. There is complete superior dislocation of the femoral  components with respect to the acetabulum.  IMPRESSION: Right hip hemiarthroplasty dislocation.   Electronically Signed   By: Maryclare Bean M.D.   On: 12/29/2013 18:18    Scheduled Meds: . apixaban  2.5 mg Oral BID  . cholecalciferol  1,000 Units Oral Daily  . dextrose      . diltiazem  180 mg Oral Daily  . fentaNYL      . furosemide  40 mg Oral Daily  . insulin aspart  0-15 Units Subcutaneous TID WC  . insulin aspart  0-5 Units Subcutaneous QHS  . insulin aspart  3 Units Subcutaneous BID AC  . latanoprost  1 drop Both Eyes QHS  . metoprolol tartrate  25 mg Oral BID  . potassium chloride SA  40 mEq Oral Daily  . QUEtiapine  12.5 mg Oral QHS  . senna  1 tablet Oral BID  . sertraline  50 mg Oral Daily  . spironolactone  12.5 mg Oral q morning - 10a  . timolol  1 drop Both Eyes Daily   Continuous Infusions: . sodium chloride 50 mL/hr at 12/30/13 0215    Principal Problem:   Diabetes mellitus Active Problems:   Atrial fibrillation   CKD (chronic kidney disease), stage III   History of dementia   Recurrent dislocation of hip joint prosthesis   Dislocation, hip   Time spent: 35   CHIU, STEPHEN K  [facility] Pager [phone]. If 7PM-7AM, please contact night-coverage at [email or web address], password [facility] 12/30/2013, 8:14 AM  LOS: 1 day

## 2013-12-30 NOTE — Progress Notes (Signed)
CSW assisting with d/c planning. Pt to return to [facility] this afternoon. Pt has been here under OBS and less than 24 hrs. FL2 not required. AVS sent to SNF. Nsg to notify family of pending d/c  and will call [facility] for  transport .  Cori RazorJamie Jasiri Hanawalt LCSW [phone]

## 2013-12-30 NOTE — Progress Notes (Signed)
Inpatient Diabetes Program Recommendations  AACE/ADA: New Consensus Statement on Inpatient Glycemic Control (2013)  Target Ranges:  Prepandial:   less than 140 mg/dL      Peak postprandial:   less than 180 mg/dL (1-2 hours)      Critically ill patients:  140 - 180 mg/dL   Reason for Visit: Hypoglycemia  Results for [name], Morgan G (MRN [id number]) as of 12/30/2013 09:27  Ref. Range 12/29/2013 23:41 12/30/2013 07:46 12/30/2013 07:56 12/30/2013 08:04 12/30/2013 08:28  Glucose-Capillary Latest Range: 70-99 mg/dL 784293 (H) 34 (LL) 34 (LL) 54 (L) 96    Inpatient Diabetes Program Recommendations Insulin - Basal: If FBS>180mg /dL, please consider addition of Lantus 5 units Q24 hours Correction (SSI): Decrease Novolog to sensitive tidwc HgbA1C: 8.6% - uncontrolled  Hypoglycemia with large dose of Novolog and NPO.  Note: May need adjustments to diabetes meds at discharge. Recommend f/u with PCP for diabetes management.  Thank you. Ailene Ardshonda Anahi Belmar, RD, LDN, CDE Inpatient Diabetes Coordinator [phone]

## 2013-12-30 NOTE — Discharge Summary (Signed)
Physician Discharge Summary      Patient ID: Morgan Morris MRN: [id number] DOB/AGE: [birth year] 80 y.o.  Admit date: 12/29/2013 Discharge date: 12/30/2013  Admission Diagnoses:  Diabetes mellitus  Discharge Diagnoses:  Principal Problem:   Diabetes mellitus Active Problems:   Atrial fibrillation   CKD (chronic kidney disease), stage III   History of dementia   Recurrent dislocation of hip joint prosthesis   Dislocation, hip   Past Medical History  Diagnosis Date  . Hypertension   . Depression   . Erosive esophagitis 04/02/2012  . Mallory - Weiss tear 04/02/2012  . Dysrhythmia 02/24/2013    ATRIAL FIB WITH RVR  . Arthritis   . Atrial fibrillation   . CHF (congestive heart failure)   . Diabetes mellitus     Surgeries: Procedure(s): CLOSED REDUCTION HIP on 12/29/2013 - 12/30/2013   Consultants (if any): Treatment Team:  Lilyan Gilford, MD  Discharged Condition: Improved  Hospital Course: Morgan Morris is an 80 y.o. female who was admitted 12/29/2013 with a diagnosis of Diabetes mellitus and went to the operating room on 12/29/2013 - 12/30/2013 and underwent the above named procedures.    She was given perioperative antibiotics:      Anti-infectives   None    .  She was given sequential compression devices, early ambulation, and eliquis for DVT prophylaxis.  She benefited maximally from the hospital stay and there were no complications.    Recent vital signs:  Filed Vitals:   12/30/13 1419  BP: 105/63  Pulse: 69  Temp: 98.4 F (36.9 C)  Resp: 16    Recent laboratory studies:  Lab Results  Component Value Date   HGB 10.2* 12/29/2013   HGB 7.7* 11/16/2013   HGB 7.9* 11/15/2013   Lab Results  Component Value Date   WBC 7.4 12/29/2013   PLT 154 12/29/2013   Lab Results  Component Value Date   INR 1.66* 12/29/2013   Lab Results  Component Value Date   NA 131* 12/29/2013   K 4.9 12/29/2013   CL 91* 12/29/2013   CO2 24 12/29/2013   BUN 54* 12/29/2013   CREATININE  1.62* 12/29/2013   GLUCOSE 442* 12/29/2013    Discharge Medications:     Medication List    STOP taking these medications       HYDROcodone-acetaminophen 5-325 MG per tablet  Commonly known as:  NORCO/VICODIN     oxyCODONE 5 MG immediate release tablet  Commonly known as:  Oxy IR/ROXICODONE      TAKE these medications       acetaminophen 500 MG tablet  Commonly known as:  TYLENOL  Take 1 tablet (500 mg total) by mouth every 6 (six) hours as needed.     albuterol 108 (90 BASE) MCG/ACT inhaler  Commonly known as:  PROVENTIL HFA;VENTOLIN HFA  Inhale 2 puffs into the lungs every 6 (six) hours as needed for wheezing.     albuterol (2.5 MG/3ML) 0.083% nebulizer solution  Commonly known as:  PROVENTIL  Take 2.5 mg by nebulization every 6 (six) hours as needed for wheezing.     apixaban 2.5 MG Tabs tablet  Commonly known as:  ELIQUIS  Take 1 tablet (2.5 mg total) by mouth 2 (two) times daily.     b complex vitamins tablet  Take 1 tablet by mouth daily at 12 noon.     cholecalciferol 1000 UNITS tablet  Commonly known as:  VITAMIN D  Take 1,000 Units by mouth daily.  diltiazem 180 MG 24 hr capsule  Commonly known as:  CARDIZEM CD  Take 1 capsule (180 mg total) by mouth daily.     furosemide 20 MG tablet  Commonly known as:  LASIX  Take 40 mg by mouth daily.     insulin aspart 100 UNIT/ML injection  Commonly known as:  novoLOG  Inject 3 Units into the skin 2 (two) times daily. With lunch and dinner     latanoprost 0.005 % ophthalmic solution  Commonly known as:  XALATAN  Place 1 drop into both eyes at bedtime.     metoprolol tartrate 25 MG tablet  Commonly known as:  LOPRESSOR  Take 1 tablet (25 mg total) by mouth 2 (two) times daily.     OCUVITE PRESERVISION PO  Take 1 tablet by mouth 2 (two) times daily.     potassium chloride SA 20 MEQ tablet  Commonly known as:  K-DUR,KLOR-CON  Take 2 tablets (40 mEq total) by mouth daily.     pravastatin 40 MG tablet    Commonly known as:  PRAVACHOL  Take 40 mg by mouth daily.     QUEtiapine 25 MG tablet  Commonly known as:  SEROQUEL  Take 6.25 mg by mouth at bedtime.     sertraline 50 MG tablet  Commonly known as:  ZOLOFT  Take 50 mg by mouth daily.     spironolactone 25 MG tablet  Commonly known as:  ALDACTONE  Take 12.5 mg by mouth every morning.     timolol 0.5 % ophthalmic solution  Commonly known as:  TIMOPTIC  Place 1 drop into both eyes daily.        Diagnostic Studies: Dg Chest 1 View  12/29/2013   CLINICAL DATA:  Pain post trauma  EXAM: CHEST - 1 VIEW  COMPARISON:  November 14, 2013  FINDINGS: There is no edema or consolidation. Heart is mildly enlarged with normal pulmonary vascularity. No adenopathy. No pneumothorax. No bone lesions. Calcification is noted in the right breast.  IMPRESSION: No edema or consolidation. No pneumothorax. Stable calcification right breast.   Electronically Signed   By: Bretta Bang M.D.   On: 12/29/2013 18:19   Dg Hip Complete Right  12/29/2013   CLINICAL DATA:  Reduction of recent dislocated total hip prosthesis  EXAM: RIGHT HIP - COMPLETE 2+ VIEW  COMPARISON:  Study obtained earlier in the day  FINDINGS: Frontal and lateral views were obtained. The previously noted dislocated total hip prosthesis on the right has been successfully reduced. Currently there is no fracture or dislocation. Osteoarthritic change is noted in the right sacroiliac joint.  IMPRESSION: Successful reduction of right total hip prosthesis dislocation. Currently no fracture or dislocation.   Electronically Signed   By: Bretta Bang M.D.   On: 12/29/2013 23:19   Dg Hip Complete Right  12/29/2013   CLINICAL DATA:  Fall  EXAM: RIGHT HIP - COMPLETE 2+ VIEW  COMPARISON:  None.  FINDINGS: Right hip hemiarthroplasty is noted. There is complete superior dislocation of the femoral components with respect to the acetabulum.  IMPRESSION: Right hip hemiarthroplasty dislocation.   Electronically  Signed   By: Maryclare Bean M.D.   On: 12/29/2013 18:18   Dg Hip Complete Right  12/19/2013   CLINICAL DATA:  Right hip pain.  EXAM: RIGHT HIP - COMPLETE 2+ VIEW  COMPARISON:  11/11/2013  FINDINGS: Since the previous study, there is been interval placement of a right hip hemiarthroplasty. There is superior and anterior dislocation of  the prosthesis from the acetabulum. Small bone fragment adjacent to the superior acetabulum could represent a small displaced fracture. Pelvis and left hip appear otherwise intact. Degenerative changes in the spine.  IMPRESSION: Right hip hemiarthroplasty with superior and anterior dislocation.   Electronically Signed   By: Burman NievesWilliam  Stevens M.D.   On: 12/19/2013 22:39   Dg Hip Portable 1 View Right  12/20/2013   CLINICAL DATA:  Post reduction dislocation of the right hip prosthesis.  EXAM: PORTABLE RIGHT HIP - 1 VIEW 12/20/2013 0004 hr:  COMPARISON:  Right hip x-rays yesterday.  FINDINGS: Anatomic alignment of the right hip post reduction. The fracture fragment arising from the acetabulum identified on the earlier images is not visualized on the current image. There is no periprosthetic lucency.  IMPRESSION: Anatomic alignment of the right hip post reduction.   Electronically Signed   By: Hulan Saashomas  Lawrence M.D.   On: 12/20/2013 00:11    Disposition: 03-Skilled Nursing Facility  Discharge Orders   Future Orders Complete By Expires   Call MD / Call 911  As directed    Comments:     If you experience chest pain or shortness of breath, CALL 911 and be transported to the hospital emergency room.  If you develope a fever above 101.5 F, pus (white drainage) or increased drainage or redness at the wound, or calf pain, call your surgeon's office.   Call MD / Call 911  As directed    Comments:     If you experience chest pain or shortness of breath, CALL 911 and be transported to the hospital emergency room.  If you develope a fever above 101.5 F, pus (white drainage) or increased  drainage or redness at the wound, or calf pain, call your surgeon's office.   Constipation Prevention  As directed    Comments:     Drink plenty of fluids.  Prune juice may be helpful.  You may use a stool softener, such as Colace (over the counter) 100 mg twice a day.  Use MiraLax (over the counter) for constipation as needed.   Diet - low sodium heart healthy  As directed    Discharge wound care:  As directed    Comments:     If you have a hip bandage, keep it clean and dry.  Change your bandage as instructed by your health care providers.  If your bandage has been discontinued, keep your incision clean and dry.  Pat dry after showering.  DO NOT put lotion, powder, or antibiotic ointments on your incision.   Driving restrictions  As directed    Comments:     No driving while taking narcotic pain meds.   Increase activity slowly as tolerated  As directed    Weight bearing as tolerated  As directed    Questions:     Laterality:  right   Extremity:  Lower      Follow-up Information   Follow up with Cheral AlmasXu, Naiping Michael, MD In 2 weeks.   Specialty:  Orthopedic Surgery   Contact information:   [address] [place] [address] [phone]        Signed: Cheral AlmasXu, Naiping Michael 12/30/2013, 2:39 PM

## 2013-12-30 NOTE — Progress Notes (Signed)
INITIAL NUTRITION ASSESSMENT  DOCUMENTATION CODES Per approved criteria  -Not Applicable   INTERVENTION: - Glucerna shakes TID - Will continue to monitor   NUTRITION DIAGNOSIS: Inadequate oral intake related to dementia, lethargy, poor appetite as evidenced by 0% meal intake.   Goal: Pt to consume >90% of meals/supplements  Monitor:  Weights, labs, intake  Reason for Assessment: Malnutrition screening tool   80 y.o. female  Admitting Dx: Diabetes mellitus  ASSESSMENT: Admitted right hip pain, found to have dislocation of right hip and had closed reduction yesterday. Pt with some dementia per MD, pt asleep during visit but son at bedside. He reports pt was eating well at her facility PTA, eating 3 meals/day with stable weight and was drinking some kind of nutritional supplement which he thought was Ensure. Pt has not been eating since admission per RN charting, 0% of meals. Son reports pt's blood sugars run high, between 300-500 mg/dL. Past weight trend shows pt's weight down 15 pounds in the past 2 months.   Height: Ht Readings from Last 1 Encounters:  12/30/13 5' (1.524 m)    Weight: Wt Readings from Last 1 Encounters:  12/30/13 135 lb 4.8 oz (61.372 kg)    Ideal Body Weight: 100 lb  % Ideal Body Weight: 135%  Wt Readings from Last 10 Encounters:  12/30/13 135 lb 4.8 oz (61.372 kg)  12/30/13 135 lb 4.8 oz (61.372 kg)  11/17/13 150 lb 9.2 oz (68.3 kg)  11/17/13 150 lb 9.2 oz (68.3 kg)  08/17/13 131 lb 3.2 oz (59.512 kg)  06/03/13 145 lb 1 oz (65.8 kg)  04/01/13 138 lb 7.2 oz (62.8 kg)  02/27/13 152 lb (68.947 kg)  03/29/12 146 lb 14.4 oz (66.633 kg)  03/29/12 146 lb 14.4 oz (66.633 kg)    Usual Body Weight: 150 lb 2 months ago  % Usual Body Weight: 90%  BMI:  Body mass index is 26.42 kg/(m^2).  Estimated Nutritional Needs: Kcal: 9147-8295 Protein: 75-90g Fluid: 1.5-1.7L/day  Skin: Right hip incision   Diet Order: Carb Control  EDUCATION  NEEDS: -No education needs identified at this time   Intake/Output Summary (Last 24 hours) at 12/30/13 0949 Last data filed at 12/30/13 0832  Gross per 24 hour  Intake 971.67 ml  Output    850 ml  Net 121.67 ml    Last BM: PTA  Labs:   Recent Labs Lab 12/29/13 1745  NA 131*  K 4.9  CL 91*  CO2 24  BUN 54*  CREATININE 1.62*  CALCIUM 9.0  GLUCOSE 442*    CBG (last 3)   Recent Labs  12/30/13 0756 12/30/13 0804 12/30/13 0828  GLUCAP 34* 54* 96    Scheduled Meds: . apixaban  2.5 mg Oral BID  . cholecalciferol  1,000 Units Oral Daily  . dextrose      . diltiazem  180 mg Oral Daily  . fentaNYL      . furosemide  40 mg Oral Daily  . insulin aspart  0-15 Units Subcutaneous TID WC  . insulin aspart  0-5 Units Subcutaneous QHS  . insulin aspart  3 Units Subcutaneous BID AC  . latanoprost  1 drop Both Eyes QHS  . metoprolol tartrate  25 mg Oral BID  . potassium chloride SA  40 mEq Oral Daily  . QUEtiapine  12.5 mg Oral QHS  . senna  1 tablet Oral BID  . sertraline  50 mg Oral Daily  . spironolactone  12.5 mg Oral q morning -  10a  . timolol  1 drop Both Eyes Daily    Continuous Infusions: . sodium chloride 50 mL/hr at 12/30/13 0215    Past Medical History  Diagnosis Date  . Hypertension   . Depression   . Erosive esophagitis 04/02/2012  . Mallory - Weiss tear 04/02/2012  . Dysrhythmia 02/24/2013    ATRIAL FIB WITH RVR  . Arthritis   . Atrial fibrillation   . CHF (congestive heart failure)   . Diabetes mellitus     Past Surgical History  Procedure Laterality Date  . Abdominal hysterectomy    . Appendectomy    . Cholecystectomy    . Esophagogastroduodenoscopy  03/31/2012    Procedure: ESOPHAGOGASTRODUODENOSCOPY (EGD);  Surgeon: Meryl DareMalcolm T Stark, MD,FACG;  [facility];  Service: Endoscopy;  Laterality: N/A;  . Hip arthroplasty Right 11/12/2013    Procedure: RIGHT HIP HEMIARTHROPLASTY;  Surgeon: Cheral AlmasNaiping Michael Xu, MD;  Location: [facility] OR;   Service: Orthopedics;  Laterality: Right;     Levon HedgerHeather Baron MS, RD, LDN [phone] Pager [phone] After Hours Pager

## 2013-12-30 NOTE — Progress Notes (Signed)
OT Cancellation Note  Patient Details Name: Reece LeaderBetty G Kooy MRN: [id number] DOB: 12-12-1920   Cancelled Treatment:    Reason Eval/Treat Not Completed: Patient not medically ready (Pt on bedrest until fitted with hip abduction brace.) Will continue to follow.  Evern BioMayberry, Sammie Denner Lynn 12/30/2013, 8:40 AM

## 2013-12-30 NOTE — Progress Notes (Signed)
Had been incont of urine before insertion of foley cath inserted. Also had pulled iv out of lt ac and bled on both her gown and linen. Taken to room and pt cleaned and appropriate linen changed. Transferred to bed.

## 2013-12-30 NOTE — Progress Notes (Signed)
   Subjective:  Patient reports pain as mild.    Objective:   VITALS:   Filed Vitals:   12/30/13 0115 12/30/13 0215 12/30/13 0415 12/30/13 0605  BP: 117/71 123/74 131/82 135/75  Pulse: 76 73 73 74  Temp: 98.3 F (36.8 C) 97.9 F (36.6 C) 97.6 F (36.4 C) 97.6 F (36.4 C)  TempSrc:  Oral Oral Oral  Resp: 13 20 20 20   Height: 5' (1.524 m)     Weight: 61.372 kg (135 lb 4.8 oz)     SpO2: 99% 100% 98% 96%    Dorsiflexion/Plantar flexion intact Abduction pillow in place   Lab Results  Component Value Date   WBC 7.4 12/29/2013   HGB 10.2* 12/29/2013   HCT 30.5* 12/29/2013   MCV 92.1 12/29/2013   PLT 154 12/29/2013     Assessment/Plan: 1 Day Post-Op   Problem List Items Addressed This Visit     Cardiovascular and Mediastinum   Atrial fibrillation   Relevant Medications      apixaban (ELIQUIS) tablet 2.5 mg      furosemide (LASIX) tablet 40 mg (Start on 12/30/2013 10:00 AM)      metoprolol tartrate (LOPRESSOR) tablet 25 mg      diltiazem (CARDIZEM CD) 24 hr capsule 180 mg (Start on 12/30/2013 10:00 AM)      spironolactone (ALDACTONE) tablet 12.5 mg (Start on 12/30/2013 10:00 AM)     Endocrine   *Diabetes mellitus   Relevant Medications      insulin aspart (NOVOLOG) 100 UNIT/ML injection      insulin aspart (novoLOG) injection 30 Units (Completed)      insulin aspart (novoLOG) injection 3 Units (Start on 12/30/2013 12:00 PM)      insulin aspart (novoLOG) injection 0-15 Units      insulin aspart (novoLOG) injection 0-5 Units     Musculoskeletal and Integument   Dislocation, hip     Genitourinary   CKD (chronic kidney disease), stage III     Other   History of dementia    Other Visit Diagnoses   Hip dislocation, right, initial encounter    -  Primary    Pain        Relevant Orders       DG Chest 1 View (Completed)    Wheezing           Advance diet Bedrest with abduction pillow for now.  Abduction brace has been called in.  Once fitted with brace, patient may be up  with PT.   WBAT in hip abduction brace once fitted. DVT ppx - SCDs, ambulation, eliquis WBAT right lower extremity D/c foley once brace is on and patient is off bedrest restriction Patient may discharge back to SNF after working with PT in brace Medical comorbidities - per medicine, appreciate assistance   Cheral AlmasXu, Claris Pech Michael 12/30/2013, 8:08 AM [phone]

## 2013-12-30 NOTE — Discharge Instructions (Signed)
1. Hip abduction brace on at all times

## 2013-12-31 NOTE — Progress Notes (Signed)
PT evaluation G-Codes   12/30/13 1356  PT Time Calculation  PT Start Time 1002  PT Stop Time 1025  PT Time Calculation (min) 23 min  PT G-Codes **NOT FOR INPATIENT CLASS**  Functional Assessment Tool Used clinical judgement  Functional Limitation Mobility: Walking and moving around  Mobility: Walking and Moving Around Current Status (Z6109(G8978) CK  Mobility: Walking and Moving Around Goal Status (U0454(G8979) CI  PT General Charges  $$ ACUTE PT VISIT 1 Procedure  PT Evaluation  $Initial PT Evaluation Tier I 1 Procedure  PT Treatments  $Therapeutic Activity 23-37 mins   Zenovia JarredKati Laylamarie Meuser, PT, DPT 12/31/2013 Pager: [phone]

## 2014-01-11 ENCOUNTER — Emergency Department (HOSPITAL_COMMUNITY): Payer: Medicare Other

## 2014-01-11 ENCOUNTER — Encounter (HOSPITAL_COMMUNITY): Payer: Self-pay | Admitting: Emergency Medicine

## 2014-01-11 ENCOUNTER — Emergency Department (HOSPITAL_COMMUNITY)
Admission: EM | Admit: 2014-01-11 | Discharge: 2014-01-12 | Disposition: A | Discharge status: Home / Self Care | Payer: Medicare Other | Attending: Emergency Medicine | Admitting: Emergency Medicine

## 2014-01-11 DIAGNOSIS — Z96649 Presence of unspecified artificial hip joint: Secondary | ICD-10-CM | POA: Insufficient documentation

## 2014-01-11 DIAGNOSIS — Y939 Activity, unspecified: Secondary | ICD-10-CM | POA: Diagnosis not present

## 2014-01-11 DIAGNOSIS — M129 Arthropathy, unspecified: Secondary | ICD-10-CM | POA: Insufficient documentation

## 2014-01-11 DIAGNOSIS — Z880 Allergy status to penicillin: Secondary | ICD-10-CM | POA: Insufficient documentation

## 2014-01-11 DIAGNOSIS — I10 Essential (primary) hypertension: Secondary | ICD-10-CM | POA: Diagnosis not present

## 2014-01-11 DIAGNOSIS — R404 Transient alteration of awareness: Secondary | ICD-10-CM | POA: Insufficient documentation

## 2014-01-11 DIAGNOSIS — Y9210 Unspecified residential institution as the place of occurrence of the external cause: Secondary | ICD-10-CM | POA: Diagnosis not present

## 2014-01-11 DIAGNOSIS — F3289 Other specified depressive episodes: Secondary | ICD-10-CM | POA: Diagnosis not present

## 2014-01-11 DIAGNOSIS — Y831 Surgical operation with implant of artificial internal device as the cause of abnormal reaction of the patient, or of later complication, without mention of misadventure at the time of the procedure: Secondary | ICD-10-CM | POA: Diagnosis not present

## 2014-01-11 DIAGNOSIS — Z7902 Long term (current) use of antithrombotics/antiplatelets: Secondary | ICD-10-CM | POA: Diagnosis not present

## 2014-01-11 DIAGNOSIS — E119 Type 2 diabetes mellitus without complications: Secondary | ICD-10-CM | POA: Insufficient documentation

## 2014-01-11 DIAGNOSIS — S79919A Unspecified injury of unspecified hip, initial encounter: Secondary | ICD-10-CM | POA: Diagnosis present

## 2014-01-11 DIAGNOSIS — R0902 Hypoxemia: Secondary | ICD-10-CM | POA: Diagnosis not present

## 2014-01-11 DIAGNOSIS — Z8719 Personal history of other diseases of the digestive system: Secondary | ICD-10-CM | POA: Diagnosis not present

## 2014-01-11 DIAGNOSIS — I509 Heart failure, unspecified: Secondary | ICD-10-CM | POA: Diagnosis not present

## 2014-01-11 DIAGNOSIS — F0390 Unspecified dementia without behavioral disturbance: Secondary | ICD-10-CM | POA: Insufficient documentation

## 2014-01-11 DIAGNOSIS — I4891 Unspecified atrial fibrillation: Secondary | ICD-10-CM | POA: Insufficient documentation

## 2014-01-11 DIAGNOSIS — Z792 Long term (current) use of antibiotics: Secondary | ICD-10-CM | POA: Diagnosis not present

## 2014-01-11 DIAGNOSIS — F329 Major depressive disorder, single episode, unspecified: Secondary | ICD-10-CM | POA: Insufficient documentation

## 2014-01-11 DIAGNOSIS — T8489XA Other specified complication of internal orthopedic prosthetic devices, implants and grafts, initial encounter: Secondary | ICD-10-CM | POA: Diagnosis not present

## 2014-01-11 DIAGNOSIS — M25559 Pain in unspecified hip: Secondary | ICD-10-CM | POA: Insufficient documentation

## 2014-01-11 DIAGNOSIS — W19XXXA Unspecified fall, initial encounter: Secondary | ICD-10-CM | POA: Diagnosis not present

## 2014-01-11 DIAGNOSIS — Z794 Long term (current) use of insulin: Secondary | ICD-10-CM | POA: Diagnosis not present

## 2014-01-11 DIAGNOSIS — T84029A Dislocation of unspecified internal joint prosthesis, initial encounter: Secondary | ICD-10-CM | POA: Insufficient documentation

## 2014-01-11 LAB — URINALYSIS, ROUTINE W REFLEX MICROSCOPIC
Bilirubin Urine: NEGATIVE
Glucose, UA: NEGATIVE mg/dL
HGB URINE DIPSTICK: NEGATIVE
Ketones, ur: NEGATIVE mg/dL
Leukocytes, UA: NEGATIVE
Nitrite: NEGATIVE
PH: 5.5 (ref 5.0–8.0)
Protein, ur: NEGATIVE mg/dL
SPECIFIC GRAVITY, URINE: 1.011 (ref 1.005–1.030)
UROBILINOGEN UA: 0.2 mg/dL (ref 0.0–1.0)

## 2014-01-11 LAB — CBC WITH DIFFERENTIAL/PLATELET
Basophils Absolute: 0.1 10*3/uL (ref 0.0–0.1)
Basophils Relative: 1 % (ref 0–1)
EOS ABS: 0.5 10*3/uL (ref 0.0–0.7)
Eosinophils Relative: 3 % (ref 0–5)
HCT: 33.3 % — ABNORMAL LOW (ref 36.0–46.0)
HEMOGLOBIN: 11.4 g/dL — AB (ref 12.0–15.0)
Lymphocytes Relative: 11 % — ABNORMAL LOW (ref 12–46)
Lymphs Abs: 1.5 10*3/uL (ref 0.7–4.0)
MCH: 30.5 pg (ref 26.0–34.0)
MCHC: 34.2 g/dL (ref 30.0–36.0)
MCV: 89 fL (ref 78.0–100.0)
MONO ABS: 1.5 10*3/uL — AB (ref 0.1–1.0)
Monocytes Relative: 10 % (ref 3–12)
NEUTROS ABS: 10.9 10*3/uL — AB (ref 1.7–7.7)
Neutrophils Relative %: 75 % (ref 43–77)
Platelets: 306 10*3/uL (ref 150–400)
RBC: 3.74 MIL/uL — ABNORMAL LOW (ref 3.87–5.11)
RDW: 14.5 % (ref 11.5–15.5)
WBC: 14.5 10*3/uL — ABNORMAL HIGH (ref 4.0–10.5)

## 2014-01-11 LAB — BASIC METABOLIC PANEL
BUN: 41 mg/dL — ABNORMAL HIGH (ref 6–23)
CHLORIDE: 93 meq/L — AB (ref 96–112)
CO2: 24 mEq/L (ref 19–32)
Calcium: 9.2 mg/dL (ref 8.4–10.5)
Creatinine, Ser: 1.96 mg/dL — ABNORMAL HIGH (ref 0.50–1.10)
GFR calc Af Amer: 25 mL/min — ABNORMAL LOW (ref >90)
GFR, EST NON AFRICAN AMERICAN: 21 mL/min — AB (ref >90)
GLUCOSE: 134 mg/dL — AB (ref 70–99)
POTASSIUM: 4.9 meq/L (ref 3.7–5.3)
SODIUM: 134 meq/L — AB (ref 137–147)

## 2014-01-11 MED ORDER — MIDAZOLAM HCL 5 MG/5ML IJ SOLN: 4.0000 mg | Freq: Once | INTRAMUSCULAR | Status: DC

## 2014-01-11 MED ORDER — MIDAZOLAM HCL 2 MG/2ML IJ SOLN
4.0000 mg | Freq: Once | INTRAMUSCULAR | Status: AC
  Administered 2014-01-11: 4 mg via INTRAVENOUS
  Filled 2014-01-11: qty 4

## 2014-01-11 MED ORDER — NALOXONE HCL 0.4 MG/ML IJ SOLN
INTRAMUSCULAR | Status: AC
  Administered 2014-01-11: 0.4 mg
  Filled 2014-01-11: qty 1

## 2014-01-11 MED ORDER — MIDAZOLAM HCL 2 MG/2ML IJ SOLN
INTRAMUSCULAR | Status: AC | PRN
  Administered 2014-01-11 (×2): 2 mg via INTRAVENOUS

## 2014-01-11 MED ORDER — FENTANYL CITRATE 0.05 MG/ML IJ SOLN
INTRAMUSCULAR | Status: AC | PRN
  Administered 2014-01-11 (×2): 50 ug via INTRAVENOUS

## 2014-01-11 MED ORDER — FENTANYL CITRATE 0.05 MG/ML IJ SOLN
50.0000 ug | Freq: Once | INTRAMUSCULAR | Status: AC
  Administered 2014-01-11: 100 ug via INTRAVENOUS
  Filled 2014-01-11: qty 2

## 2014-01-11 NOTE — Discharge Instructions (Signed)
Please Have the patient wear her splint AT ALL TIMES, except for bathing and toileting. Follow with Dr. Roda ShuttersXu asap  Hip Dislocation Hip dislocation is the displacement of the "ball" at the head of your thigh bone (femur) from its socket in the hip bone (pelvis). The ball-and-socket structure of the hip joint gives it a lot of stability, while allowing it to move freely. Therefore, a lot of force is required to displace the femur from its socket. A hip dislocation is an emergency. If you believe you have dislocated your hip and cannot move your leg, call for help immediately. Do not try to move. CAUSES The most common cause of hip dislocation is motor vehicle accidents. However, force from falls from a height (a ladder or building), injuries from contact sports, or injuries from industrial accidents can be enough to dislocate your hip. SYMPTOMS A hip dislocation is very painful. If you have a dislocated hip, you will not be able to move your hip. If you have nerve damage, you may not have feeling in your lower leg, foot, or ankle.  DIAGNOSIS Usually, your caregiver can diagnose a hip dislocation by looking at the position of your leg. Generally, X-ray exams are done to check for fractures in your femur or pelvis. The leg of the dislocated hip will appear shorter than the other leg, and your foot will be turned inward. TREATMENT  Your caregiver can manipulate your bones back into the joint (reduction). If there are no other complications involved with your dislocation, such as fractures or damage to blood vessels or nerves, this procedure can be done without surgery. Before this procedure, you will be given medicine so that you will not feel pain (anesthetic). Often specialized imaging exams are done after the reduction (magnetic resonance imaging [MRI] or computed tomography [CT]) to check for loose pieces of cartilage or bone in the joint. If a manual reduction fails or you have nerve damage, damage to  your blood vessels, or bone fractures, surgery will be necessary to perform the reduction.  HOME CARE INSTRUCTIONS The following measures can help to reduce pain and speed up the healing process:  Rest your injured joint. Do not move your joint if it is painful. Also, avoid activities similar to the one that caused your injury.  Apply ice to your injured joint for 1 to 2 days after your reduction or as directed by your caregiver. Applying ice helps to reduce inflammation and pain.  Put ice in a plastic bag.  Place a towel between your skin and the bag.  Leave the ice on for 15 to 20 minutes at a time, every 2 hours while you are awake.  Use crutches or a walker as directed by your caregiver.  Exercise your hip and leg as directed by your caregiver.  Take over-the-counter or prescription medicine for pain as directed by your caregiver. SEEK IMMEDIATE MEDICAL CARE IF:  Your pain becomes worse rather than better.  You feel like your hip has become dislocated again. MAKE SURE YOU:  Understand these instructions.  Will watch your condition.  Will get help right away if you are not doing well or get worse. Document Released: 09/03/2001 Document Revised: 03/02/2012 Document Reviewed: 05/09/2011 [facility] Patient Information 2014 [employer], [place].

## 2014-01-11 NOTE — ED Notes (Signed)
Bed: WA02 Expected date:  Expected time:  Means of arrival:  Comments: ems 

## 2014-01-11 NOTE — ED Provider Notes (Signed)
4:16 PM Pt signed out to me by Arthor CaptainAbigail Harris, PA-C at shift change. Plan is to f/u on repeat x-ray of hip, and allow sedation medication to wear off.  Plan is to discharge pt home with hip splint. Pt will be going back to [facility].  4:44 PM  Consulted with Dr. Roda ShuttersXu, orthopedist, who requested CT pelvis w/o contrast once pt is placed in splint.  If normal, will discharge home as planned and have pt f/u in his office with a family member latter this week.   9:18 PM Pt still difficult to awaken.  Discussed pt with Dr. Gwendolyn GrantWalden, will continue to observe in ED.  12:57 AM  Pt awaking and becoming more alert and oriented.  Discussed pt with Dr. Gwendolyn GrantWalden, will discharge home with instructions to f/u with Dr. Roda ShuttersXu as previously planned.    Junius FinnerErin O'Malley, PA-C 01/12/14 [phone]

## 2014-01-11 NOTE — ED Provider Notes (Signed)
CSN: [id number]     Arrival date & time 01/11/14  1324 History   First MD Initiated Contact with Patient 01/11/14 1337     Chief Complaint  Patient presents with  . Hip Injury   (Consider location/radiation/quality/duration/timing/severity/associated sxs/prior Treatment) HPI Morgan Morris Is a 80 year old female who is a resident of assisted-living facility.  The patient brought in to the ED for right hip dislocation.  The patient had a right hip hemiarthroplasty performed on 11/12/2013 after femur fracture.  This is her third visit to the emergency department for a dislocation of the same hip. The patient has some baseline dementia and is unable to give history.  History gathered by EMS..  The patient was found in bed this morning with right hip deformity and internal rotation.  X-ray of the hip confirmed a superior dislocation.  Patient was sent here.  Patient denies pain.  No report of falls. Past Medical History  Diagnosis Date  . Hypertension   . Depression   . Erosive esophagitis 04/02/2012  . Mallory - Weiss tear 04/02/2012  . Dysrhythmia 02/24/2013    ATRIAL FIB WITH RVR  . Arthritis   . Atrial fibrillation   . CHF (congestive heart failure)   . Diabetes mellitus    Past Surgical History  Procedure Laterality Date  . Abdominal hysterectomy    . Appendectomy    . Cholecystectomy    . Esophagogastroduodenoscopy  03/31/2012    Procedure: ESOPHAGOGASTRODUODENOSCOPY (EGD);  Surgeon: Meryl Dare, MD,FACG;  Location: [facility];  Service: Endoscopy;  Laterality: N/A;  . Hip arthroplasty Right 11/12/2013    Procedure: RIGHT HIP HEMIARTHROPLASTY;  Surgeon: Cheral Almas, MD;  Location: [facility] OR;  Service: Orthopedics;  Laterality: Right;  . Hip closed reduction Right 12/29/2013    Procedure: CLOSED REDUCTION HIP;  Surgeon: Cheral Almas, MD;  Location: [facility];  Service: Orthopedics;  Laterality: Right;   Family History  Problem Relation Age of Onset  . Cancer Mother     History  Substance Use Topics  . Smoking status: Never Smoker   . Smokeless tobacco: Never Used  . Alcohol Use: No   OB History   Grav Para Term Preterm Abortions TAB SAB Ect Mult Living                 Review of Systems  Unable to perform ROS   Allergies  Penicillins and Sulfa antibiotics  Home Medications   Current Outpatient Rx  Name  Route  Sig  Dispense  Refill  . albuterol (PROVENTIL HFA;VENTOLIN HFA) 108 (90 BASE) MCG/ACT inhaler   Inhalation   Inhale 2 puffs into the lungs every 6 (six) hours as needed for wheezing.   1 Inhaler   2   . albuterol (PROVENTIL) (2.5 MG/3ML) 0.083% nebulizer solution   Nebulization   Take 2.5 mg by nebulization every 6 (six) hours as needed for wheezing.         Marland Kitchen apixaban (ELIQUIS) 2.5 MG TABS tablet   Oral   Take 1 tablet (2.5 mg total) by mouth 2 (two) times daily.      0   . b complex vitamins tablet   Oral   Take 1 tablet by mouth daily at 12 noon.         . cholecalciferol (VITAMIN D) 1000 UNITS tablet   Oral   Take 1,000 Units by mouth daily.         . ciprofloxacin (CIPRO) 500 MG tablet  Oral   Take 500 mg by mouth 2 (two) times daily.         Marland Kitchen. diltiazem (CARDIZEM CD) 180 MG 24 hr capsule   Oral   Take 1 capsule (180 mg total) by mouth daily.   30 capsule   0   . furosemide (LASIX) 20 MG tablet   Oral   Take 40 mg by mouth daily.         Marland Kitchen. HYDROcodone-acetaminophen (NORCO/VICODIN) 5-325 MG per tablet   Oral   Take 1 tablet by mouth every 6 (six) hours as needed for moderate pain.         Marland Kitchen. insulin aspart (NOVOLOG) 100 UNIT/ML injection   Subcutaneous   Inject 3 Units into the skin 2 (two) times daily. With lunch and dinner         . insulin glargine (LANTUS) 100 UNIT/ML injection   Subcutaneous   Inject 15 Units into the skin at bedtime.         Marland Kitchen. latanoprost (XALATAN) 0.005 % ophthalmic solution   Both Eyes   Place 1 drop into both eyes at bedtime.         . metoprolol  tartrate (LOPRESSOR) 25 MG tablet   Oral   Take 1 tablet (25 mg total) by mouth 2 (two) times daily.   60 tablet   0   . Multiple Vitamins-Minerals (OCUVITE PRESERVISION PO)   Oral   Take 1 tablet by mouth 2 (two) times daily.         . potassium chloride SA (K-DUR,KLOR-CON) 20 MEQ tablet   Oral   Take 2 tablets (40 mEq total) by mouth daily.         . pravastatin (PRAVACHOL) 40 MG tablet   Oral   Take 40 mg by mouth daily.         . QUEtiapine (SEROQUEL) 25 MG tablet   Oral   Take 6.25 mg by mouth at bedtime.         . sertraline (ZOLOFT) 50 MG tablet   Oral   Take 50 mg by mouth daily.          Marland Kitchen. spironolactone (ALDACTONE) 25 MG tablet   Oral   Take 12.5 mg by mouth every morning.         . timolol (TIMOPTIC) 0.5 % ophthalmic solution   Both Eyes   Place 1 drop into both eyes daily.          BP 113/46  Pulse 59  Temp(Src) 97.8 F (36.6 C) (Oral)  Resp 22  SpO2 98% Physical Exam  Constitutional: She is oriented to person, place, and time. She appears well-developed and well-nourished. No distress.  HENT:  Head: Normocephalic and atraumatic.  Eyes: Conjunctivae are normal. No scleral icterus.  Neck: Normal range of motion.  Cardiovascular: Normal rate, regular rhythm and normal heart sounds.  Exam reveals no gallop and no friction rub.   No murmur heard. Pulmonary/Chest: Effort normal and breath sounds normal. No respiratory distress.  Abdominal: Soft. Bowel sounds are normal. She exhibits no distension and no mass. There is no tenderness. There is no guarding.  Musculoskeletal:  Right Hip sitting in internal rotation.  Distal pulses intact.  Neurological: She is alert and oriented to person, place, and time.  Skin: Skin is warm and dry. She is not diaphoretic.    ED Course  Procedures (including critical care time) Labs Review Labs Reviewed  CBC WITH DIFFERENTIAL - Abnormal; Notable for the  following:    WBC 14.5 (*)    RBC 3.74 (*)     Hemoglobin 11.4 (*)    HCT 33.3 (*)    Neutro Abs 10.9 (*)    Lymphocytes Relative 11 (*)    Monocytes Absolute 1.5 (*)    All other components within normal limits  BASIC METABOLIC PANEL - Abnormal; Notable for the following:    Sodium 134 (*)    Chloride 93 (*)    Glucose, Bld 134 (*)    BUN 41 (*)    Creatinine, Ser 1.96 (*)    GFR calc non Af Amer 21 (*)    GFR calc Af Amer 25 (*)    All other components within normal limits  URINALYSIS, ROUTINE W REFLEX MICROSCOPIC   Imaging Review Dg Hip Complete Right  01/11/2014   CLINICAL DATA:  Fall with pain.  EXAM: RIGHT HIP - COMPLETE 2+ VIEW  COMPARISON:  12/29/2013  FINDINGS: Examination demonstrates complete superior dislocation of the right hip arthroplasty to include both acetabular and femoral components. There are mild degenerative changes of the left hip. There is no acute fracture. Remainder the exam is unchanged.  IMPRESSION: Complete superior dislocation of the right hip arthroplasty both femoral and acetabular components.   Electronically Signed   By: Elberta Fortis M.D.   On: 01/11/2014 14:11    EKG Interpretation   None       MDM   1. Recurrent dislocation of hip joint prosthesis    BP 113/46  Pulse 59  Temp(Src) 97.8 F (36.6 C) (Oral)  Resp 22  SpO2 98%  3:07 PM Patient with right hip dislocation.  I've spoken with Dr.XU.  States that patient is supposed to be in a hip brace.  Did not see brace present today.  Patient has no other signs of trauma and is not in any distress.  The patient is seen in chair visit with Dr.Yao.   3:57 PM BP 166/60  Pulse 59  Temp(Src) 97.8 F (36.6 C) (Oral)  Resp 16  SpO2 100% Patient with R hip reduction. Waiting for repeat films.  I have given report  PA o'malley who will assume care.   Arthor Captain, PA-C 01/12/14 [phone]

## 2014-01-11 NOTE — ED Notes (Signed)
Pt sleeping soundly, attempted to wake pt however pt is very drowsy and is unable to stay awake without stimuli.

## 2014-01-11 NOTE — ED Notes (Signed)
Patient presents by EMS from [facility], staff noted right hip to be deformed, patient reporting right hip pain, xray completed and resulted in right hip dislocation. EMS reporting shortening and inward rotation.

## 2014-01-11 NOTE — ED Notes (Signed)
Pt will be eligible to go home when she is able to wake up. Currently only arousable to noxious stimuli.

## 2014-01-11 NOTE — ED Notes (Signed)
Patient transported to X-ray 

## 2014-01-11 NOTE — ED Notes (Signed)
Pt still not able to stay awake. VSS.

## 2014-01-11 NOTE — ED Notes (Signed)
Pt unable to sign consent for self, no family with pt. EDP made aware.

## 2014-01-11 NOTE — ED Notes (Signed)
Pt slowly becoming more arousable.

## 2014-01-12 DIAGNOSIS — T84029A Dislocation of unspecified internal joint prosthesis, initial encounter: Secondary | ICD-10-CM | POA: Diagnosis not present

## 2014-01-12 NOTE — ED Notes (Signed)
Morgan ArnoldStacy, daughter in law, called to let her know pt still here, waiting to wake up. Left message.

## 2014-01-12 NOTE — ED Provider Notes (Signed)
Medical screening examination/treatment/procedure(s) were conducted as a shared visit with non-physician practitioner(s) and myself.  I personally evaluated the patient during the encounter.  EKG Interpretation   None       Patient sedated by Dr. Silverio LayYao, she is persistently somnolent. I watched patient for 7-8 hours with mild gradual improvement in her sleepiness. She is more awake when we talk to her, but is still somewhat. She is maintaining and protecting her airway. She stable for discharge back to her facility.   Dagmar HaitWilliam Tsutomu Barfoot, MD 01/12/14 [phone]

## 2014-01-13 NOTE — ED Provider Notes (Signed)
Medical screening examination/treatment/procedure(s) were conducted as a shared visit with non-physician practitioner(s) and myself.  I personally evaluated the patient during the encounter.  EKG Interpretation   None       Morgan Morris is a 80 y.o. female hx of dementia, R hip arthroplasty with recurrent dislocations here with another hip dislocation. Didn't remember how she did it. We called Dr. Roda ShuttersXu, who request that we perform reduction in the ED. I performed reduction below with fentanyl, versed. Hip successfully reduced, however she became hypoxic and was given narcan. Patient signed out to Dr. Gwendolyn GrantWalden and Denny PeonErin, PA to reassess mental status and discharge when sober.    Conscious sedation for hip reduction Procedure R hip reduction of dislocation  Consent obtained from daughter in law Time out called Patient sedated with 50 mcg fentanyl and 2 mg versed initially. However, was still in pain so was given another 50 mcg fentanyl and 2 mg versed with good sedation Dislocation reduced with pelvic pressure and rotation of the hip  Position was confirmed by xray and ct pelvis  Complication: Patient became hypoxic improved with jaw thrust and narcan.   Richardean Canalavid H Jewel Venditto, MD 01/13/14 [phone]

## 2014-02-05 IMAGING — CR DG HIP COMPLETE 2+V*R*
3 series · 3 of 3 positions shown · non-contrast
Comparison: 11/11/2013

CLINICAL DATA: Right hip pain.

EXAM:
RIGHT HIP - COMPLETE 2+ VIEW

[t pelvis ap]
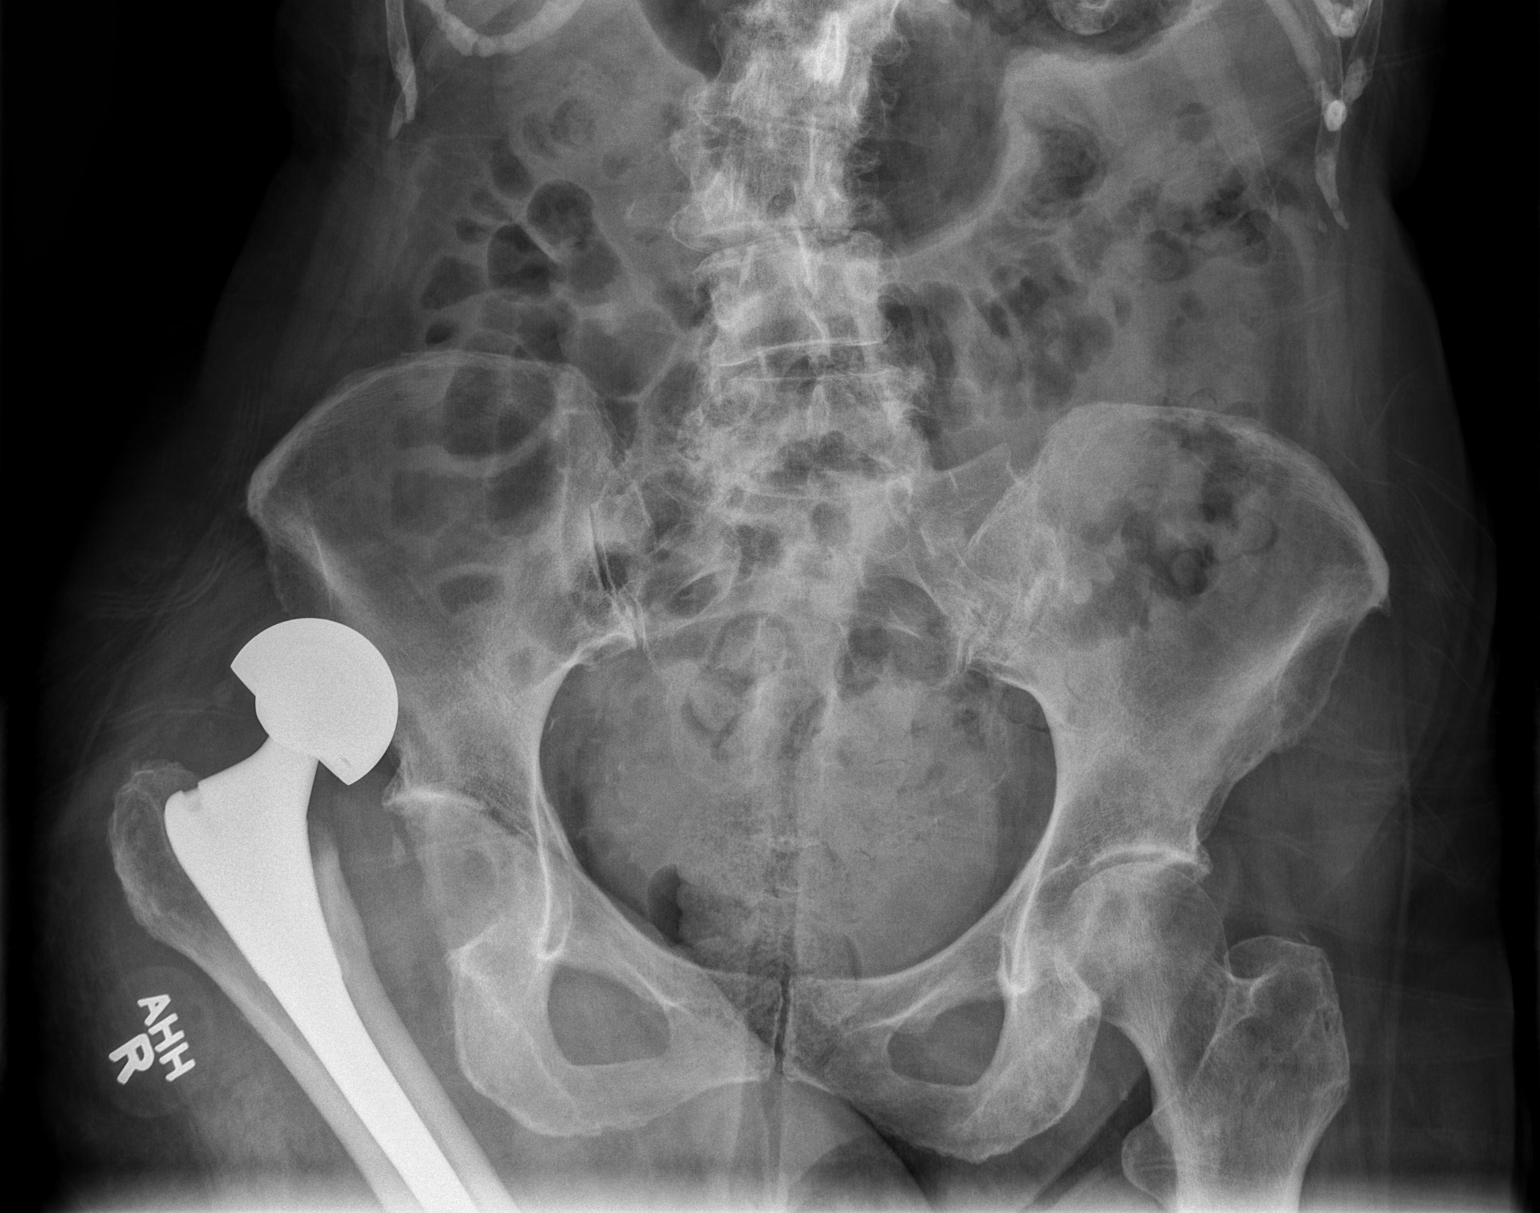

[t hip ap right]
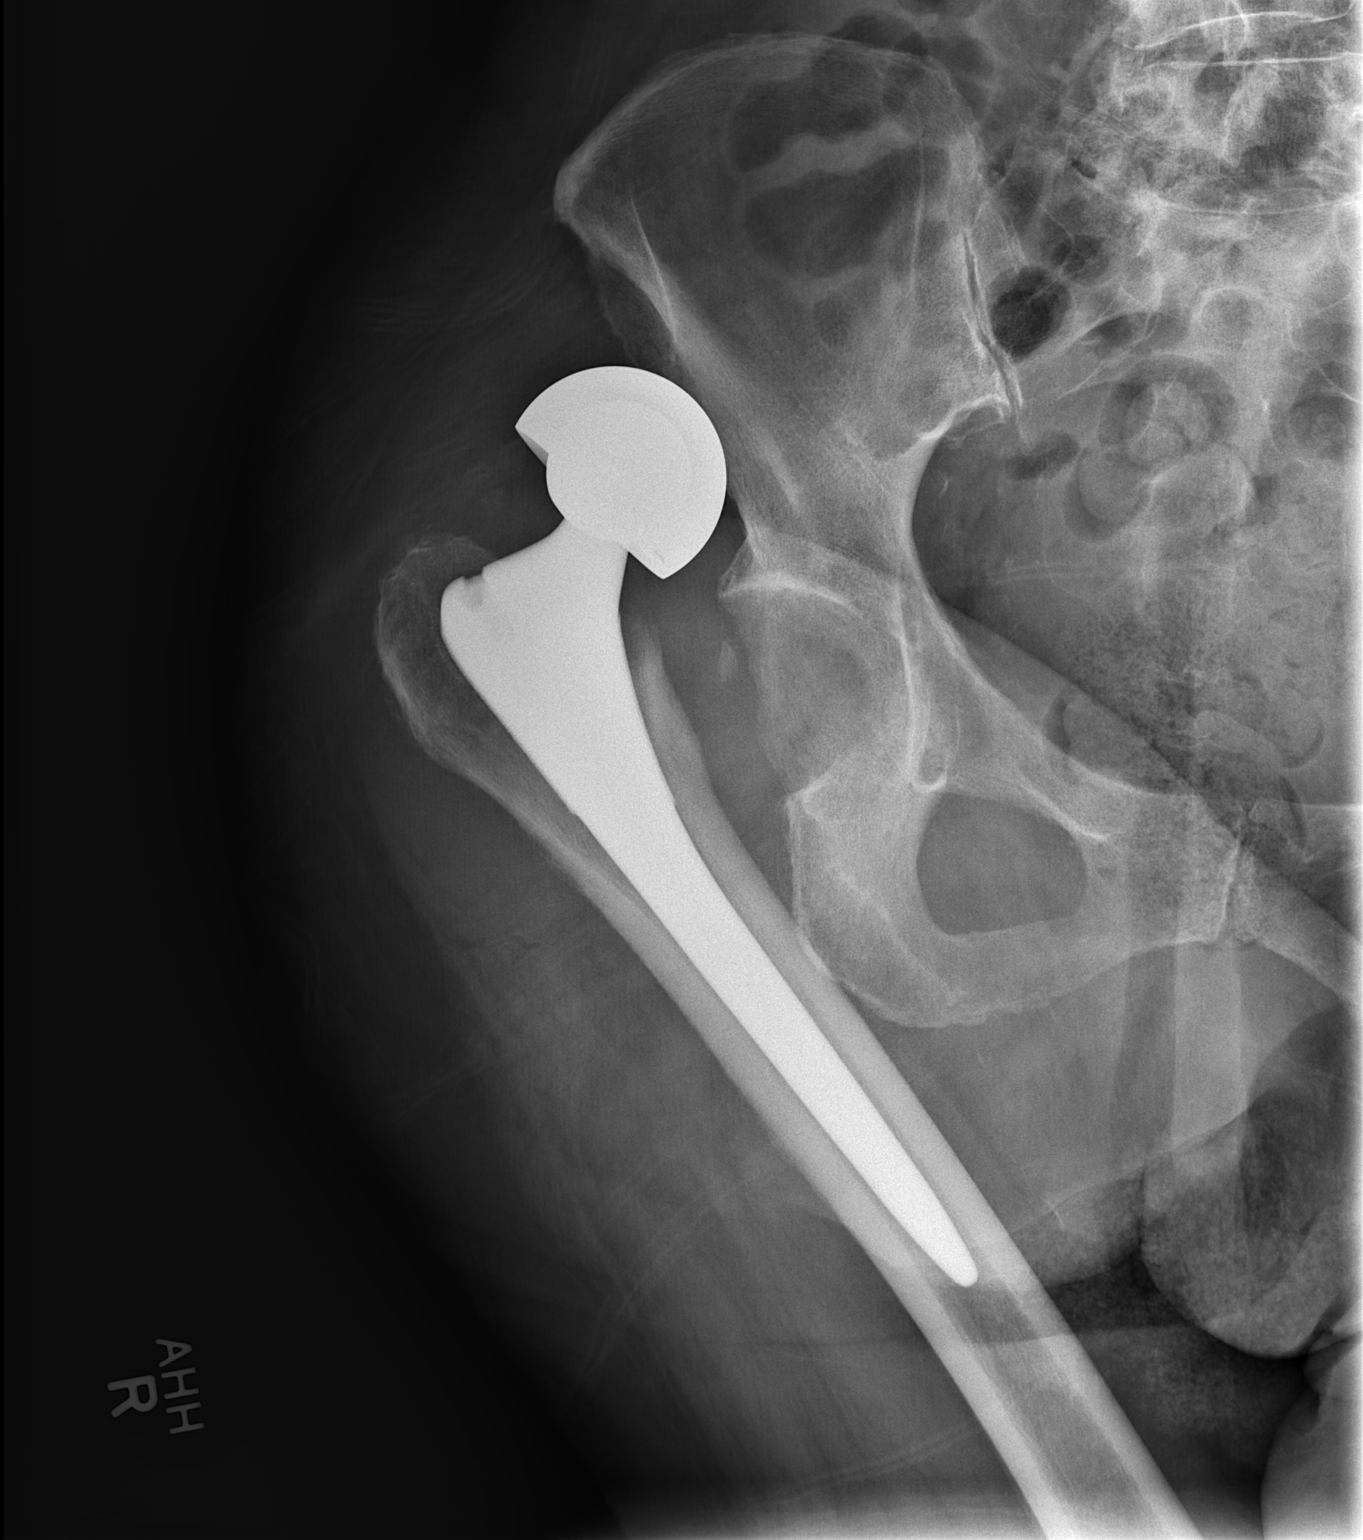

[w hip lat right]
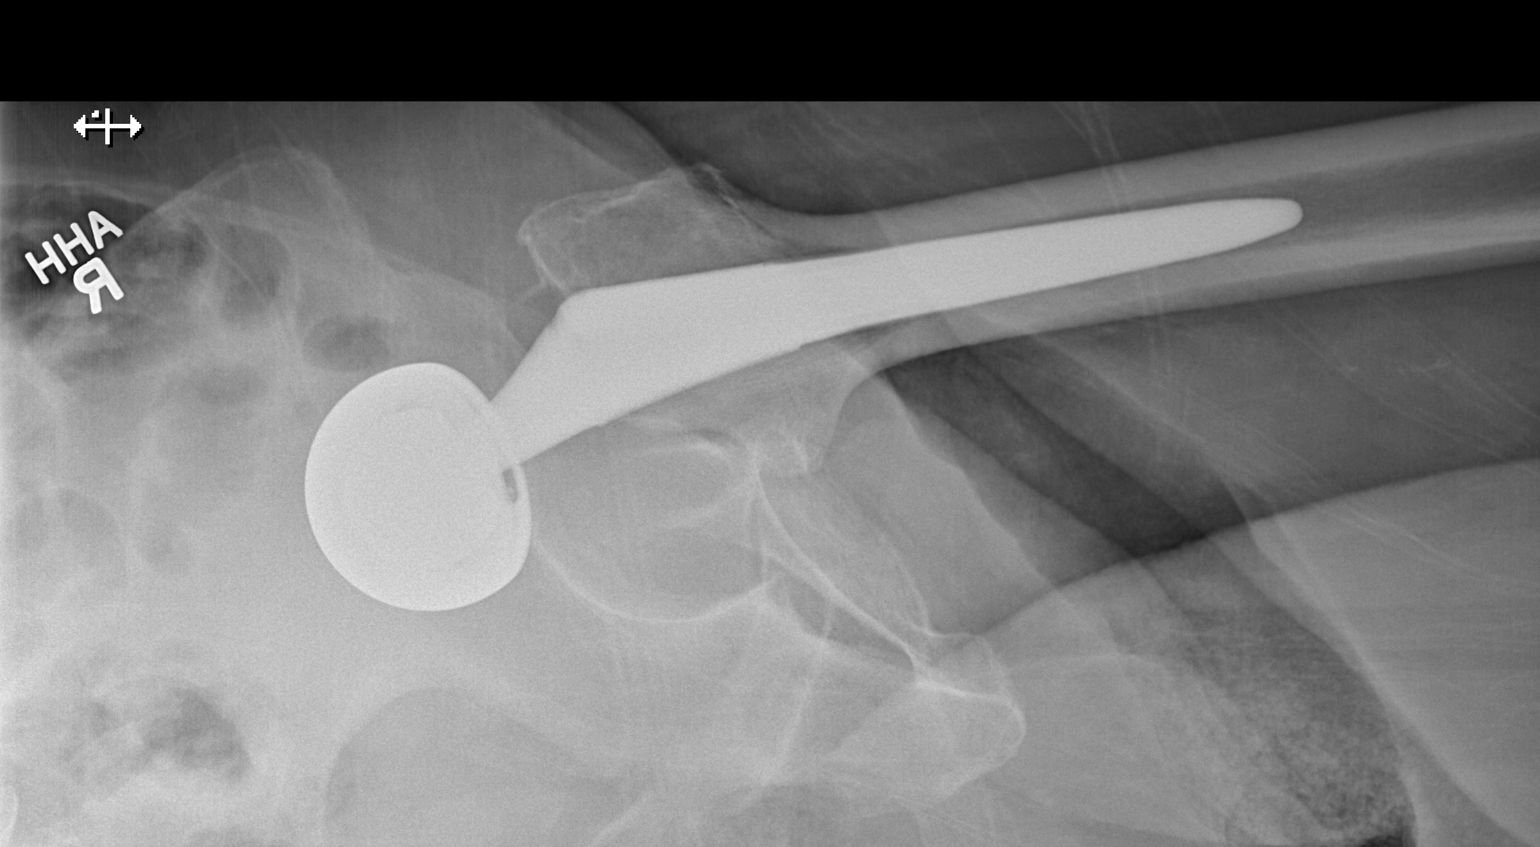

[3 of 3 positions shown; findings below may reference images not displayed]

FINDINGS: Since the previous study, there is been interval placement of a
right hip hemiarthroplasty. There is superior and anterior
dislocation of the prosthesis from the acetabulum. Small bone
fragment adjacent to the superior acetabulum could represent a small
displaced fracture. Pelvis and left hip appear otherwise intact.
Degenerative changes in the spine.
IMPRESSION: Right hip hemiarthroplasty with superior and anterior dislocation.

## 2014-02-06 IMAGING — CR DG HIP 1V PORT*R*
1 series · 1 of 1 positions shown · non-contrast
Comparison: Right hip x-rays yesterday.

CLINICAL DATA: Post reduction dislocation of the right hip
prosthesis.

EXAM:
PORTABLE RIGHT HIP - 1 VIEW [DATE]/4774 7774 hr:

[AP]
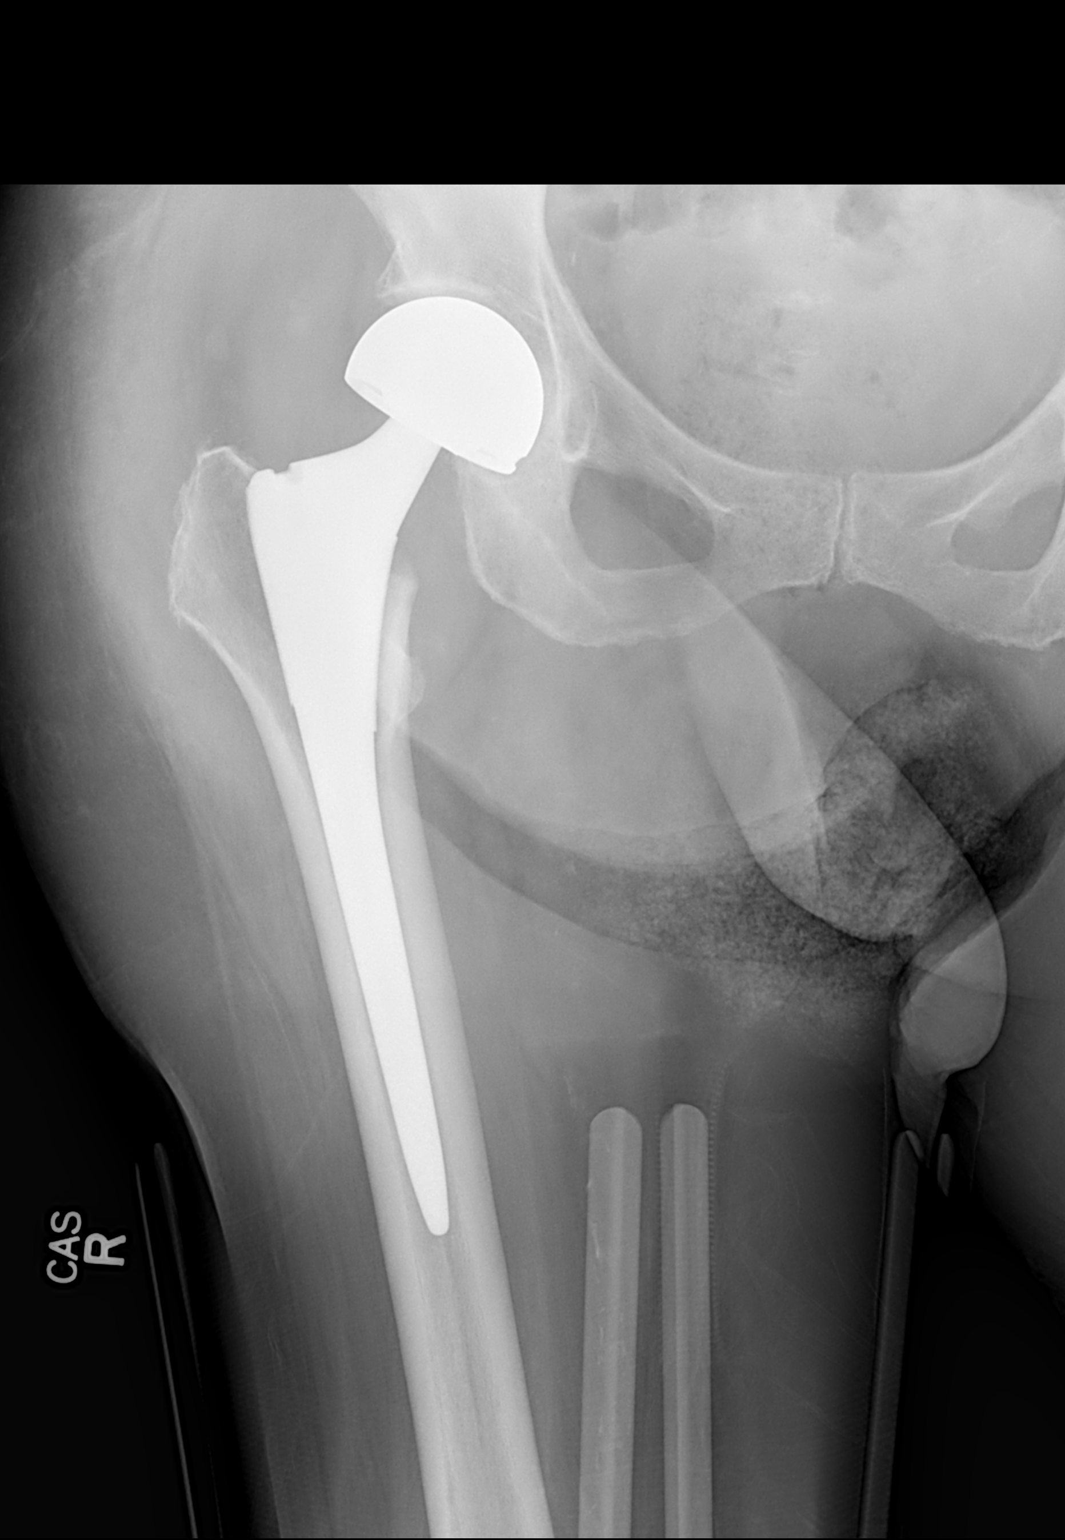

[1 of 1 positions shown; findings below may reference images not displayed]

FINDINGS: Anatomic alignment of the right hip post reduction. The fracture
fragment arising from the acetabulum identified on the earlier
images is not visualized on the current image. There is no
periprosthetic lucency.
IMPRESSION: Anatomic alignment of the right hip post reduction.

## 2014-02-15 IMAGING — CR DG CHEST 1V
1 series · 1 of 1 positions shown · non-contrast
Comparison: November 14, 2013

CLINICAL DATA: Pain post trauma

EXAM:
CHEST - 1 VIEW

[x chest ap]
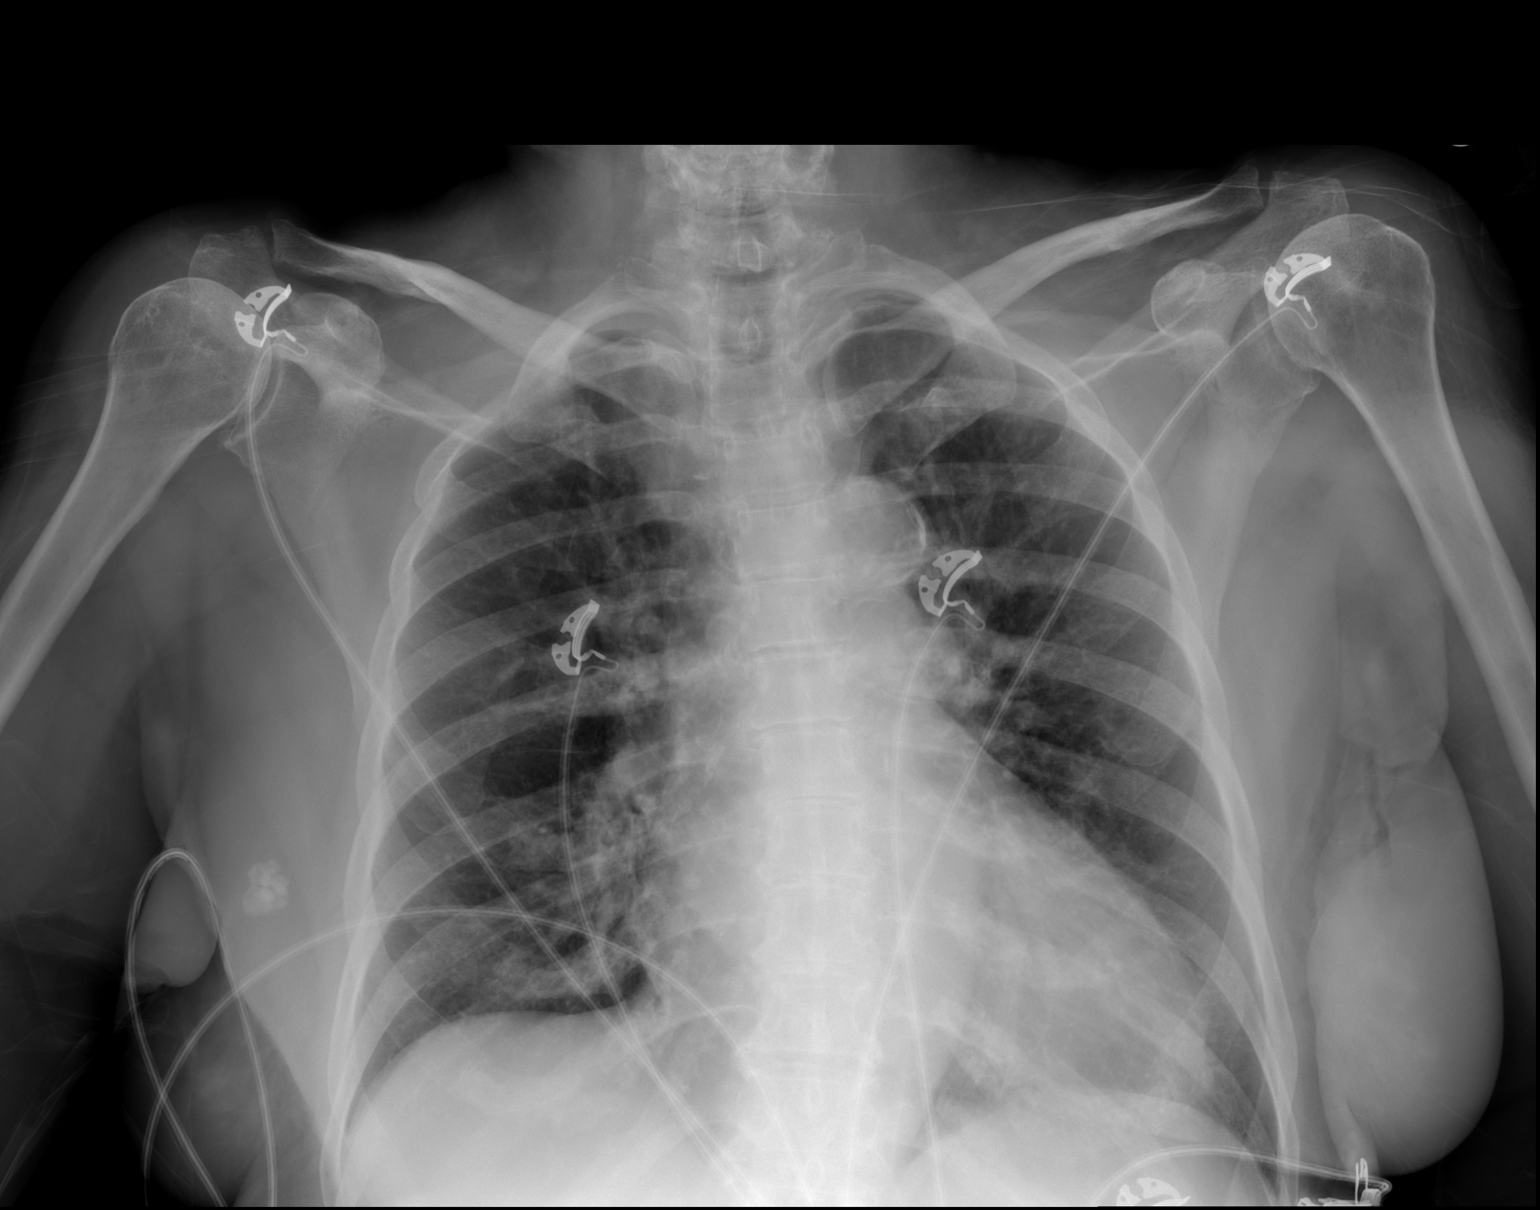

[1 of 1 positions shown; findings below may reference images not displayed]

FINDINGS: There is no edema or consolidation. Heart is mildly enlarged with
normal pulmonary vascularity. No adenopathy. No pneumothorax. No
bone lesions. Calcification is noted in the right breast.
IMPRESSION: No edema or consolidation. No pneumothorax. Stable calcification
right breast.

## 2014-02-23 ENCOUNTER — Emergency Department (HOSPITAL_COMMUNITY): Payer: Medicare Other

## 2014-02-23 ENCOUNTER — Encounter (HOSPITAL_COMMUNITY): Payer: Medicare Other | Admitting: Anesthesiology

## 2014-02-23 ENCOUNTER — Encounter (HOSPITAL_COMMUNITY): Admission: EM | Disposition: A | Discharge status: Skilled Nursing Facility | Payer: Self-pay | Source: Home / Self Care

## 2014-02-23 ENCOUNTER — Observation Stay (HOSPITAL_COMMUNITY)
Admission: EM | Admit: 2014-02-23 | Discharge: 2014-02-25 | Disposition: A | Discharge status: Skilled Nursing Facility | Payer: Medicare Other | Attending: Orthopaedic Surgery | Admitting: Orthopaedic Surgery

## 2014-02-23 ENCOUNTER — Encounter (HOSPITAL_COMMUNITY): Payer: Self-pay | Admitting: Emergency Medicine

## 2014-02-23 ENCOUNTER — Emergency Department (HOSPITAL_COMMUNITY): Payer: Medicare Other | Admitting: Anesthesiology

## 2014-02-23 DIAGNOSIS — Z9089 Acquired absence of other organs: Secondary | ICD-10-CM | POA: Diagnosis not present

## 2014-02-23 DIAGNOSIS — Z91199 Patient's noncompliance with other medical treatment and regimen due to unspecified reason: Secondary | ICD-10-CM | POA: Insufficient documentation

## 2014-02-23 DIAGNOSIS — X58XXXA Exposure to other specified factors, initial encounter: Secondary | ICD-10-CM | POA: Insufficient documentation

## 2014-02-23 DIAGNOSIS — Z9119 Patient's noncompliance with other medical treatment and regimen: Secondary | ICD-10-CM | POA: Insufficient documentation

## 2014-02-23 DIAGNOSIS — Z90710 Acquired absence of both cervix and uterus: Secondary | ICD-10-CM | POA: Insufficient documentation

## 2014-02-23 DIAGNOSIS — Z794 Long term (current) use of insulin: Secondary | ICD-10-CM | POA: Diagnosis not present

## 2014-02-23 DIAGNOSIS — Z9079 Acquired absence of other genital organ(s): Secondary | ICD-10-CM | POA: Insufficient documentation

## 2014-02-23 DIAGNOSIS — F329 Major depressive disorder, single episode, unspecified: Secondary | ICD-10-CM | POA: Insufficient documentation

## 2014-02-23 DIAGNOSIS — F3289 Other specified depressive episodes: Secondary | ICD-10-CM | POA: Diagnosis not present

## 2014-02-23 DIAGNOSIS — I509 Heart failure, unspecified: Secondary | ICD-10-CM | POA: Insufficient documentation

## 2014-02-23 DIAGNOSIS — I10 Essential (primary) hypertension: Secondary | ICD-10-CM | POA: Diagnosis not present

## 2014-02-23 DIAGNOSIS — E119 Type 2 diabetes mellitus without complications: Secondary | ICD-10-CM | POA: Insufficient documentation

## 2014-02-23 DIAGNOSIS — T84029A Dislocation of unspecified internal joint prosthesis, initial encounter: Principal | ICD-10-CM | POA: Insufficient documentation

## 2014-02-23 DIAGNOSIS — Z96649 Presence of unspecified artificial hip joint: Secondary | ICD-10-CM | POA: Insufficient documentation

## 2014-02-23 DIAGNOSIS — Z79899 Other long term (current) drug therapy: Secondary | ICD-10-CM | POA: Diagnosis not present

## 2014-02-23 DIAGNOSIS — S73004A Unspecified dislocation of right hip, initial encounter: Secondary | ICD-10-CM | POA: Diagnosis present

## 2014-02-23 HISTORY — PX: HIP CLOSED REDUCTION: SHX983

## 2014-02-23 SURGERY — CLOSED MANIPULATION, JOINT, HIP
Anesthesia: General | Site: Hip | Laterality: Right

## 2014-02-23 MED ORDER — KETAMINE HCL 10 MG/ML IJ SOLN
0.5000 mg/kg | Freq: Once | INTRAMUSCULAR | Status: AC
  Administered 2014-02-23: 20 mg via INTRAVENOUS
  Filled 2014-02-23: qty 3.1

## 2014-02-23 MED ORDER — PROPOFOL 10 MG/ML IV BOLUS
INTRAVENOUS | Status: AC | PRN
  Administered 2014-02-23: 20 mg via INTRAVENOUS

## 2014-02-23 MED ORDER — PROPOFOL 10 MG/ML IV BOLUS
INTRAVENOUS | Status: AC
  Filled 2014-02-23: qty 20

## 2014-02-23 MED ORDER — LACTATED RINGERS IV SOLN
INTRAVENOUS | Status: DC | PRN
  Administered 2014-02-23: 23:00:00 via INTRAVENOUS

## 2014-02-23 MED ORDER — PHENYLEPHRINE HCL 10 MG/ML IJ SOLN
INTRAMUSCULAR | Status: DC | PRN
  Administered 2014-02-23 (×2): 40 ug via INTRAVENOUS

## 2014-02-23 MED ORDER — APIXABAN 2.5 MG PO TABS
2.5000 mg | ORAL_TABLET | ORAL | Status: AC
  Administered 2014-02-23: 2.5 mg via ORAL
  Filled 2014-02-23: qty 1

## 2014-02-23 MED ORDER — KETAMINE HCL 10 MG/ML IJ SOLN
INTRAMUSCULAR | Status: AC | PRN
  Administered 2014-02-23: 20 mg via INTRAVENOUS

## 2014-02-23 MED ORDER — LACTATED RINGERS IV SOLN: INTRAVENOUS | Status: DC

## 2014-02-23 MED ORDER — PROPOFOL 10 MG/ML IV BOLUS
INTRAVENOUS | Status: DC | PRN
  Administered 2014-02-23: 20 mg via INTRAVENOUS
  Administered 2014-02-23: 80 mg via INTRAVENOUS

## 2014-02-23 MED ORDER — FENTANYL CITRATE 0.05 MG/ML IJ SOLN: 25.0000 ug | INTRAMUSCULAR | Status: DC | PRN

## 2014-02-23 MED ORDER — PROPOFOL 10 MG/ML IV BOLUS
0.5000 mg/kg | Freq: Once | INTRAVENOUS | Status: AC
  Administered 2014-02-23: 20 mg via INTRAVENOUS
  Filled 2014-02-23: qty 1

## 2014-02-23 NOTE — Progress Notes (Addendum)
CSW met with patient at bedside.  Patient presents as drowsy, slight difficulties hearing, calm, cooperative, and oriented to place plus situation.  Patient reports current living arrangement is [facility] for [facility] after a hip surgery about 4 weeks ago.  Patient reports that the plans at this time is to return to [facility] to complete intended care.  Patient requested for this CSW to contact her son Liliane Channel to inform him of her current situation.  CSW called and spoke with Liliane Channel informing him that his mother is in the ED rm17.  Son appreciated the CSW calling him to confirm the patient's whereabouts.  CSW informed the son at this time the medical staff had not made any confirmed decisions on his mother's needs and he would be able to call later to confirm.     Chesley Noon, MSW, LCSWA, 02/23/2014, 7:00 PM Evening Clinical Social Worker [phone]

## 2014-02-23 NOTE — Anesthesia Preprocedure Evaluation (Addendum)
Anesthesia Evaluation  Patient identified by MRN, date of birth, ID band Patient awake    Reviewed: Allergy & Precautions, H&P , NPO status , Patient's Chart, lab work & pertinent test results, reviewed documented beta blocker date and time   Airway Mallampati: II TM Distance: >3 FB Neck ROM: full    Dental  (+) Dental Advisory Given   Pulmonary shortness of breath and with exertion,  History respiratory failure breath sounds clear to auscultation  Pulmonary exam normal       Cardiovascular Exercise Tolerance: Poor hypertension, Pt. on home beta blockers +CHF + dysrhythmias Atrial Fibrillation Rhythm:regular Rate:Normal  ECG NSR   Neuro/Psych negative neurological ROS  negative psych ROS   GI/Hepatic negative GI ROS, Neg liver ROS,   Endo/Other  diabetes, Well Controlled, Type 2, Insulin Dependent  Renal/GU negative Renal ROSsstage 3 chronic kidney disease  negative genitourinary   Musculoskeletal   Abdominal   Peds  Hematology negative hematology ROS (+)   Anesthesia Other Findings   Reproductive/Obstetrics negative OB ROS                          Anesthesia Physical Anesthesia Plan  ASA: III  Anesthesia Plan: General   Post-op Pain Management:    Induction: Intravenous  Airway Management Planned: LMA  Additional Equipment:   Intra-op Plan:   Post-operative Plan:   Informed Consent: I have reviewed the patients History and Physical, chart, labs and discussed the procedure including the risks, benefits and alternatives for the proposed anesthesia with the patient or authorized representative who has indicated his/her understanding and acceptance.   Dental Advisory Given  Plan Discussed with: CRNA and Surgeon  Anesthesia Plan Comments:         Anesthesia Quick Evaluation

## 2014-02-23 NOTE — ED Provider Notes (Signed)
CSN: [id number]     Arrival date & time 02/23/14  1812 History   First MD Initiated Contact with Patient 02/23/14 1852     Chief Complaint  Patient presents with  . Hip Injury     (Consider location/radiation/quality/duration/timing/severity/associated sxs/prior Treatment) The history is provided by the nursing home and the patient.   Morgan Morris is a 80 y.o. female sent here for evaluation from her nursing care facility. They were concerned that her hip was, again, dislocated. The patient has not had any complaints. She is unable to contribute to history. She was in the emergency department 6 weeks ago with a dislocation of a prosthetic joint. He is currently being maintained in a brace, that can help prevent dislocations.   Level V caveat: Dementia   Past Medical History  Diagnosis Date  . Hypertension   . Depression   . Erosive esophagitis 04/02/2012  . Mallory - Weiss tear 04/02/2012  . Dysrhythmia 02/24/2013    ATRIAL FIB WITH RVR  . Arthritis   . Atrial fibrillation   . CHF (congestive heart failure)   . Diabetes mellitus    Past Surgical History  Procedure Laterality Date  . Abdominal hysterectomy    . Appendectomy    . Cholecystectomy    . Esophagogastroduodenoscopy  03/31/2012    Procedure: ESOPHAGOGASTRODUODENOSCOPY (EGD);  Surgeon: Meryl Dare, MD,FACG;  Location: [facility];  Service: Endoscopy;  Laterality: N/A;  . Hip arthroplasty Right 11/12/2013    Procedure: RIGHT HIP HEMIARTHROPLASTY;  Surgeon: Cheral Almas, MD;  Location: [facility];  Service: Orthopedics;  Laterality: Right;  . Hip closed reduction Right 12/29/2013    Procedure: CLOSED REDUCTION HIP;  Surgeon: Cheral Almas, MD;  Location: [facility];  Service: Orthopedics;  Laterality: Right;   Family History  Problem Relation Age of Onset  . Cancer Mother    History  Substance Use Topics  . Smoking status: Never Smoker   . Smokeless tobacco: Never Used  . Alcohol Use: No   OB History   Grav Para Term Preterm Abortions TAB SAB Ect Mult Living                 Review of Systems  Unable to perform ROS     Allergies  Penicillins and Sulfa antibiotics  Home Medications   Current Outpatient Rx  Name  Route  Sig  Dispense  Refill  . apixaban (ELIQUIS) 2.5 MG TABS tablet   Oral   Take 1 tablet (2.5 mg total) by mouth 2 (two) times daily.      0   . b complex vitamins tablet   Oral   Take 1 tablet by mouth daily at 12 noon.         . cholecalciferol (VITAMIN D) 1000 UNITS tablet   Oral   Take 1,000 Units by mouth daily.         Marland Kitchen diltiazem (CARDIZEM CD) 180 MG 24 hr capsule   Oral   Take 1 capsule (180 mg total) by mouth daily.   30 capsule   0   . furosemide (LASIX) 20 MG tablet   Oral   Take 40 mg by mouth daily.         . insulin aspart (NOVOLOG) 100 UNIT/ML injection   Subcutaneous   Inject 6 Units into the skin 2 (two) times daily. With lunch and supper         . insulin glargine (LANTUS) 100 UNIT/ML injection   Subcutaneous  Inject 15 Units into the skin at bedtime.         Marland Kitchen. latanoprost (XALATAN) 0.005 % ophthalmic solution   Both Eyes   Place 1 drop into both eyes at bedtime.         . memantine (NAMENDA) 10 MG tablet   Oral   Take 10 mg by mouth 2 (two) times daily.         . metoprolol tartrate (LOPRESSOR) 25 MG tablet   Oral   Take 1 tablet (25 mg total) by mouth 2 (two) times daily.   60 tablet   0   . Multiple Vitamins-Minerals (OCUVITE PRESERVISION PO)   Oral   Take 1 tablet by mouth 2 (two) times daily.         . Nutritional Supplements (NUTRITIONAL SUPPLEMENT PO)   Oral   Take 90 mLs by mouth 2 (two) times daily. Sugar free med pass nutritional supplement         . potassium chloride SA (K-DUR,KLOR-CON) 20 MEQ tablet   Oral   Take 2 tablets (40 mEq total) by mouth daily.         . pravastatin (PRAVACHOL) 40 MG tablet   Oral   Take 40 mg by mouth daily.         . QUEtiapine (SEROQUEL) 25 MG  tablet   Oral   Take 6.25 mg by mouth at bedtime.         . sertraline (ZOLOFT) 50 MG tablet   Oral   Take 75 mg by mouth daily.         Marland Kitchen. spironolactone (ALDACTONE) 25 MG tablet   Oral   Take 12.5 mg by mouth every morning.         Marland Kitchen. albuterol (PROVENTIL HFA;VENTOLIN HFA) 108 (90 BASE) MCG/ACT inhaler   Inhalation   Inhale 2 puffs into the lungs every 6 (six) hours as needed for wheezing.   1 Inhaler   2   . albuterol (PROVENTIL) (2.5 MG/3ML) 0.083% nebulizer solution   Nebulization   Take 2.5 mg by nebulization every 6 (six) hours as needed for wheezing.         Marland Kitchen. HYDROcodone-acetaminophen (NORCO/VICODIN) 5-325 MG per tablet   Oral   Take 1 tablet by mouth every 6 (six) hours as needed for moderate pain.         Marland Kitchen. insulin aspart (NOVOLOG) 100 UNIT/ML injection   Subcutaneous   Inject 3 Units into the skin 2 (two) times daily. With lunch and dinner. According to sliding scale          BP 98/61  Pulse 110  Temp(Src) 98.3 F (36.8 C) (Oral)  Resp 17  Wt 135 lb 5.8 oz (61.4 kg)  SpO2 99% Physical Exam  Nursing note and vitals reviewed. Constitutional: She appears well-developed and well-nourished.  HENT:  Head: Normocephalic and atraumatic.  Right Ear: External ear normal.  Left Ear: External ear normal.  Eyes: Conjunctivae and EOM are normal. Pupils are equal, round, and reactive to light.  Neck: Normal range of motion and phonation normal. Neck supple.  Cardiovascular: Normal rate, regular rhythm and normal heart sounds.   Pulmonary/Chest: Effort normal and breath sounds normal. She exhibits no bony tenderness.  Abdominal: Soft. Normal appearance. There is no tenderness.  Musculoskeletal:  Brace from the lower torso, extending to the right leg, it appears that the function of hip stabilization.  Neurological: She is alert. No cranial nerve deficit or sensory deficit. She exhibits normal  muscle tone. Coordination normal.  Skin: Skin is warm, dry and  intact.  Psychiatric: Her behavior is normal.    ED Course  Procedures (including critical care time)  Medications  propofol (DIPRIVAN) 10 mg/mL bolus/IV push ( Intravenous Stopped 02/23/14 2206)  ketamine (KETALAR) injection (20 mg Intravenous Given 02/23/14 2205)  propofol (DIPRIVAN) 10 mg/mL bolus/IV push 30.7 mg (0 mg/kg  61.4 kg Intravenous Stopped 02/23/14 2206)  ketamine (KETALAR) injection 31 mg (20 mg Intravenous Given 02/23/14 2205)  apixaban (ELIQUIS) tablet 2.5 mg (2.5 mg Oral Given 02/23/14 2140)    Patient Vitals for the past 24 hrs:  BP Temp Temp src Pulse Resp SpO2 Weight  02/23/14 2230 98/61 mmHg - - - 17 99 % -  02/23/14 2225 94/56 mmHg - - - 17 99 % -  02/23/14 2223 103/73 mmHg - - - 16 99 % -  02/23/14 2220 88/51 mmHg - - - 14 100 % -  02/23/14 2215 90/74 mmHg - - - 13 98 % -  02/23/14 2212 92/33 mmHg - - - 15 99 % -  02/23/14 2208 96/79 mmHg - - - 18 94 % -  02/23/14 2207 75/44 mmHg - - 110 15 92 % -  02/23/14 2201 118/98 mmHg - - - 14 99 % -  02/23/14 2155 109/94 mmHg - - - - - -  02/23/14 2152 103/60 mmHg - - 114 19 97 % -  02/23/14 2104 105/64 mmHg - - 114 16 96 % 135 lb 5.8 oz (61.4 kg)  02/23/14 1814 101/47 mmHg 98.3 F (36.8 C) Oral 67 15 99 % -      8:56 PM-Consult complete with Dr.  Roda Shutters. Patient case explained and discussed.  He asked that I reduce the dislocated surgical appliance; and he will see her for further evaluation and treatment, as needed. Call ended at 2110  Procedural sedation Performed by: Mancel Bale L Consent: Verbal consent obtained. Risks and benefits: risks, benefits and alternatives were discussed Required items: required blood products, implants, devices, and special equipment available Patient identity confirmed: arm band and provided demographic data Time out: Immediately prior to procedure a "time out" was called to verify the correct patient, procedure, equipment, support staff and site/side marked as required.  Sedation type:  moderate (conscious) sedation NPO time confirmed and considedered  Sedatives: PROPOFOL and Ketamine  Physician Time at Bedside: 25 minutes  Vitals: Vital signs were monitored during sedation. Cardiac Monitor, pulse oximeter Patient tolerance: Patient tolerated the procedure well with no immediate complications. Comments: Pt with uneventful recovered. Returned to pre-procedural sedation baseline  Reduction of dislocation Date/Time: 10:34 PM Performed by: Flint Melter Authorized by: Flint Melter Consent: Verbal consent obtained. Risks and benefits: risks, benefits and alternatives were discussed Consent given by: patient Required items: required blood products, implants, devices, and special equipment available Time out: Immediately prior to procedure a "time out" was called to verify the correct patient, procedure, equipment, support staff and site/side marked as required.  Patient sedated: as above  Vitals: Vital signs were monitored during sedation. Patient tolerance: Patient tolerated the procedure well. Transient Hypotension treated with IV fluid with improvement.  Joint: Right hip, surgical appliance Reduction technique: Darrel Reach and Hip distraction--> Satisfactory reduction felt with restoration of normal right leg length. Reapplied torso brace and knee immobilizer while pt sedated.  22:10- postoperative imaging reveals the hip surgical appliance, is again dislocated. At this time her leg length is now shortened, as it was earlier. This is considered a  failed reduction, not held by usual bracing  10:28 PM-Consult complete with Dr. Roda Shutters. Patient case explained and discussed. He agrees to admit patient for further evaluation and treatment. Call ended at 22:33  Labs Review Labs Reviewed - No data to display Imaging Review Dg Hip Complete Right  02/23/2014   CLINICAL DATA:  Right hip pain  EXAM: RIGHT HIP - COMPLETE 2+ VIEW  COMPARISON:  DG HIP 1 VIEW*R* dated 01/11/2014;  CT PELVIS W/O CM dated 01/11/2014; DG HIP COMPLETE*R* dated 01/11/2014; DG HIP COMPLETE*R* dated 12/29/2013  FINDINGS: There is a total right hip arthroplasty. There is superior dislocation of the right hip. The right acetabular cup component is superiorly dislocated with normal articulation between the femoral head component and the acetabular components. There is no acute fracture.  There are mild degenerative changes of bilateral SI joints and lower lumbar spine.  IMPRESSION: Superior dislocation of the right hip arthroplasty. The right acetabular cup component is superiorly dislocated with normal articulation between the femoral head component and acetabular component.   Electronically Signed   By: Elige Ko   On: 02/23/2014 20:05     EKG Interpretation None      MDM   Final diagnoses:  Dislocation of hip, right, closed   Recurrent episodes of right hip surgical appliance, dislocation (hip), unable to be successfully reduced, and stabilized, in the emergency department.  Nursing Notes Reviewed/ Care Coordinated, and agree without changes. Applicable Imaging Reviewed.  Interpretation of Laboratory Data incorporated into ED treatment  Plan: Disposition- likely admission with orthopedic service  Flint Melter, MD 02/23/14 2358

## 2014-02-23 NOTE — Op Note (Signed)
Date of surgery: 02/23/2014  Preoperative diagnosis: Recurrent right hip hemiarthroplasty dislocation  Postoperative diagnosis: Same  Procedure: Closed treatment of right hip hemiarthroplasty dislocation with manipulation  Surgeon: Glee ArvinMichael Mishell Donalson, M.D.  Anesthesia: Gen.  Estimated blood loss: None  Complications: None  Indications for procedure: Ms. Morgan Morris is a 80 year old demented female who lives at a skilled nursing facility presents today with a recurrent dislocation of her right hip hemiarthroplasty. The patient has been treated in the past with education, abduction pillow, abduction brace. The patient is noncompliant with her posterior hip precautions and is witnessed by the staff to take off her abduction brace frequently.  We obtained the procedural consent from her son over the phone. We discussed the risks benefits and alternatives to the procedure and he wished for us to proceed.  Description of procedure: The patient was identified in the preoperative holding area. The operative site was marked by the surgeon. She was brought back to the operating room. She was placed supine on the stretcher. Gen. anesthesia was induced by the anesthesiologist. Preoperative antibiotics were not indicated. A timeout was performed. Gentle traction with hip flexion and internal rotation was used to reduce the hip dislocation. Postreduction x-rays were taken to confirm adequate reduction. The patient was placed in her hip abduction brace. She awoke from anesthesia uneventfully and was transferred to the PACU in stable condition.  Disposition: The patient is weight-bear as tolerated in an abduction brace. She is to get up with physical therapy in the morning for mobilization ambulation. We will reinforce compliance with the brace and posterior hip precautions.  We will provide in extension on the hip abduction brace down the lower leg. I plan on discussing treatment options with her son in the morning.    Morgan ReelN.  Morgan Daltyn Degroat, MD [facility] [phone] 11:57 PM

## 2014-02-23 NOTE — ED Notes (Addendum)
Per ems pt is from [facility], pt c/o hx dislocated right hip. Staff reports they noticed pts right hip looked dislocated. Shortening to right leg. Tender upon palpation. Pt alert to verbal, which is her norm. Hx of dementia

## 2014-02-23 NOTE — Progress Notes (Signed)
Patients Watch given to [facility] nurse

## 2014-02-23 NOTE — Discharge Instructions (Signed)
Wear hip abduction brace at all times Adhere to precautions taught by physical therapist

## 2014-02-23 NOTE — Preoperative (Signed)
Beta Blockers   Reason not to administer Beta Blockers:Not Applicable 

## 2014-02-24 ENCOUNTER — Encounter (HOSPITAL_COMMUNITY): Payer: Self-pay | Admitting: *Deleted

## 2014-02-24 ENCOUNTER — Emergency Department (HOSPITAL_COMMUNITY): Payer: Medicare Other

## 2014-02-24 DIAGNOSIS — T84029A Dislocation of unspecified internal joint prosthesis, initial encounter: Secondary | ICD-10-CM | POA: Diagnosis not present

## 2014-02-24 DIAGNOSIS — S73004A Unspecified dislocation of right hip, initial encounter: Secondary | ICD-10-CM | POA: Diagnosis present

## 2014-02-24 LAB — GLUCOSE, CAPILLARY
GLUCOSE-CAPILLARY: 150 mg/dL — AB (ref 70–99)
GLUCOSE-CAPILLARY: 175 mg/dL — AB (ref 70–99)
GLUCOSE-CAPILLARY: 205 mg/dL — AB (ref 70–99)
GLUCOSE-CAPILLARY: 89 mg/dL (ref 70–99)
Glucose-Capillary: 206 mg/dL — ABNORMAL HIGH (ref 70–99)
Glucose-Capillary: 61 mg/dL — ABNORMAL LOW (ref 70–99)

## 2014-02-24 MED ORDER — NONFORMULARY OR COMPOUNDED ITEM
6.2500 mg | Freq: Every day | Status: DC
  Administered 2014-02-24: 6.25 mg via ORAL
  Filled 2014-02-24 (×3): qty 1

## 2014-02-24 MED ORDER — FUROSEMIDE 40 MG PO TABS
40.0000 mg | ORAL_TABLET | Freq: Every day | ORAL | Status: DC
  Administered 2014-02-24 – 2014-02-25 (×2): 40 mg via ORAL
  Filled 2014-02-24 (×2): qty 1

## 2014-02-24 MED ORDER — MEMANTINE HCL 10 MG PO TABS
10.0000 mg | ORAL_TABLET | Freq: Two times a day (BID) | ORAL | Status: DC
Start: 2014-02-24 — End: 2014-02-25
  Administered 2014-02-24 – 2014-02-25 (×3): 10 mg via ORAL
  Filled 2014-02-24 (×4): qty 1

## 2014-02-24 MED ORDER — DILTIAZEM HCL ER COATED BEADS 180 MG PO CP24
180.0000 mg | ORAL_CAPSULE | Freq: Every day | ORAL | Status: DC
  Administered 2014-02-24 – 2014-02-25 (×2): 180 mg via ORAL
  Filled 2014-02-24 (×2): qty 1

## 2014-02-24 MED ORDER — SPIRONOLACTONE 12.5 MG HALF TABLET
12.5000 mg | ORAL_TABLET | Freq: Every morning | ORAL | Status: DC
  Administered 2014-02-24 – 2014-02-25 (×2): 12.5 mg via ORAL
  Filled 2014-02-24 (×2): qty 1

## 2014-02-24 MED ORDER — POTASSIUM CHLORIDE CRYS ER 20 MEQ PO TBCR
40.0000 meq | EXTENDED_RELEASE_TABLET | Freq: Every day | ORAL | Status: DC
  Administered 2014-02-24 – 2014-02-25 (×2): 40 meq via ORAL
  Filled 2014-02-24 (×3): qty 2

## 2014-02-24 MED ORDER — INSULIN ASPART 100 UNIT/ML ~~LOC~~ SOLN: 0.0000 [IU] | Freq: Every day | SUBCUTANEOUS | Status: DC

## 2014-02-24 MED ORDER — SERTRALINE HCL 50 MG PO TABS
75.0000 mg | ORAL_TABLET | Freq: Every day | ORAL | Status: DC
  Administered 2014-02-24 – 2014-02-25 (×2): 75 mg via ORAL
  Filled 2014-02-24 (×2): qty 1

## 2014-02-24 MED ORDER — INSULIN GLARGINE 100 UNIT/ML ~~LOC~~ SOLN
15.0000 [IU] | Freq: Every day | SUBCUTANEOUS | Status: DC
  Administered 2014-02-24 (×2): 15 [IU] via SUBCUTANEOUS
  Filled 2014-02-24 (×3): qty 0.15

## 2014-02-24 MED ORDER — INSULIN ASPART 100 UNIT/ML ~~LOC~~ SOLN
0.0000 [IU] | Freq: Three times a day (TID) | SUBCUTANEOUS | Status: DC
  Administered 2014-02-24 (×2): 5 [IU] via SUBCUTANEOUS
  Administered 2014-02-25 (×2): 3 [IU] via SUBCUTANEOUS

## 2014-02-24 MED ORDER — LATANOPROST 0.005 % OP SOLN
1.0000 [drp] | Freq: Every day | OPHTHALMIC | Status: DC
  Filled 2014-02-24: qty 2.5

## 2014-02-24 MED ORDER — DIPHENHYDRAMINE HCL 12.5 MG/5ML PO ELIX: 25.0000 mg | ORAL_SOLUTION | ORAL | Status: DC | PRN

## 2014-02-24 MED ORDER — ACETAMINOPHEN 325 MG PO TABS: 650.0000 mg | ORAL_TABLET | Freq: Four times a day (QID) | ORAL | Status: DC | PRN

## 2014-02-24 MED ORDER — ALBUTEROL SULFATE HFA 108 (90 BASE) MCG/ACT IN AERS: 2.0000 | INHALATION_SPRAY | Freq: Four times a day (QID) | RESPIRATORY_TRACT | Status: DC | PRN

## 2014-02-24 MED ORDER — PRAVASTATIN SODIUM 40 MG PO TABS
40.0000 mg | ORAL_TABLET | Freq: Every day | ORAL | Status: DC
  Administered 2014-02-24: 40 mg via ORAL
  Filled 2014-02-24 (×2): qty 1

## 2014-02-24 MED ORDER — APIXABAN 2.5 MG PO TABS
2.5000 mg | ORAL_TABLET | Freq: Two times a day (BID) | ORAL | Status: DC
  Administered 2014-02-24 – 2014-02-25 (×3): 2.5 mg via ORAL
  Filled 2014-02-24 (×4): qty 1

## 2014-02-24 MED ORDER — SENNA 8.6 MG PO TABS
1.0000 | ORAL_TABLET | Freq: Two times a day (BID) | ORAL | Status: DC
  Administered 2014-02-24 – 2014-02-25 (×2): 8.6 mg via ORAL

## 2014-02-24 MED ORDER — ALBUTEROL SULFATE (2.5 MG/3ML) 0.083% IN NEBU: 2.5000 mg | INHALATION_SOLUTION | Freq: Four times a day (QID) | RESPIRATORY_TRACT | Status: DC | PRN

## 2014-02-24 MED ORDER — SODIUM CHLORIDE 0.9 % IV SOLN
INTRAVENOUS | Status: DC
  Administered 2014-02-24: 09:00:00 via INTRAVENOUS

## 2014-02-24 MED ORDER — INSULIN ASPART 100 UNIT/ML ~~LOC~~ SOLN: 3.0000 [IU] | Freq: Two times a day (BID) | SUBCUTANEOUS | Status: DC

## 2014-02-24 MED ORDER — QUETIAPINE FUMARATE 25 MG PO TABS: 6.2500 mg | ORAL_TABLET | Freq: Every day | ORAL | Status: DC

## 2014-02-24 MED ORDER — METOCLOPRAMIDE HCL 10 MG PO TABS: 5.0000 mg | ORAL_TABLET | Freq: Three times a day (TID) | ORAL | Status: DC | PRN

## 2014-02-24 MED ORDER — ONDANSETRON HCL 4 MG PO TABS
4.0000 mg | ORAL_TABLET | Freq: Four times a day (QID) | ORAL | Status: DC | PRN
Start: 2014-02-24 — End: 2014-02-25

## 2014-02-24 MED ORDER — SIMVASTATIN 20 MG PO TABS
20.0000 mg | ORAL_TABLET | Freq: Every day | ORAL | Status: DC
  Filled 2014-02-24: qty 1

## 2014-02-24 MED ORDER — HYDROCODONE-ACETAMINOPHEN 5-325 MG PO TABS: 1.0000 | ORAL_TABLET | Freq: Four times a day (QID) | ORAL | Status: DC | PRN

## 2014-02-24 MED ORDER — INSULIN ASPART 100 UNIT/ML ~~LOC~~ SOLN
6.0000 [IU] | Freq: Two times a day (BID) | SUBCUTANEOUS | Status: DC
Start: 2014-02-24 — End: 2014-02-25
  Administered 2014-02-24 – 2014-02-25 (×2): 6 [IU] via SUBCUTANEOUS

## 2014-02-24 MED ORDER — ONDANSETRON HCL 4 MG/2ML IJ SOLN: 4.0000 mg | Freq: Four times a day (QID) | INTRAMUSCULAR | Status: DC | PRN

## 2014-02-24 MED ORDER — MORPHINE SULFATE 2 MG/ML IJ SOLN
1.0000 mg | INTRAMUSCULAR | Status: DC | PRN
Start: 2014-02-24 — End: 2014-02-25

## 2014-02-24 MED ORDER — METOCLOPRAMIDE HCL 5 MG/ML IJ SOLN: 5.0000 mg | Freq: Three times a day (TID) | INTRAMUSCULAR | Status: DC | PRN

## 2014-02-24 MED ORDER — VITAMIN D3 25 MCG (1000 UNIT) PO TABS
1000.0000 [IU] | ORAL_TABLET | Freq: Every day | ORAL | Status: DC
  Administered 2014-02-24 – 2014-02-25 (×2): 1000 [IU] via ORAL
  Filled 2014-02-24 (×2): qty 1

## 2014-02-24 MED ORDER — METOPROLOL TARTRATE 25 MG PO TABS
25.0000 mg | ORAL_TABLET | Freq: Two times a day (BID) | ORAL | Status: DC
  Administered 2014-02-24 – 2014-02-25 (×3): 25 mg via ORAL
  Filled 2014-02-24 (×4): qty 1

## 2014-02-24 NOTE — Anesthesia Postprocedure Evaluation (Signed)
  Anesthesia Post-op Note  Patient: Morgan Morris  Procedure(s) Performed: Procedure(s) (LRB): CLOSED MANIPULATION HIP (Right)  Patient Location: PACU  Anesthesia Type: General  Level of Consciousness: awake and alert   Airway and Oxygen Therapy: Patient Spontanous Breathing  Post-op Pain: mild  Post-op Assessment: Post-op Vital signs reviewed, Patient's Cardiovascular Status Stable, Respiratory Function Stable, Patent Airway and No signs of Nausea or vomiting  Last Vitals:  Filed Vitals:   02/24/14 0000  BP: 103/62  Pulse: 116  Temp: 36.4 C  Resp: 19    Post-op Vital Signs: stable   Complications: No apparent anesthesia complications

## 2014-02-24 NOTE — Progress Notes (Signed)
   Subjective:  Pleasant, no events, no pain  Objective:   VITALS:   Filed Vitals:   02/24/14 0210 02/24/14 0259 02/24/14 0400 02/24/14 0650  BP: 92/62 103/70 94/58 90/62   Pulse: 109 108 84 116  Temp: 97.8 F (36.6 C) 97.5 F (36.4 C) 97.4 F (36.3 C) 97.4 F (36.3 C)  TempSrc: Oral Oral Oral Oral  Resp: 16 16 16 16   Weight:      SpO2: 94% 98% 96% 97%    slightly demented Hip brace in place KI in place   Lab Results  Component Value Date   WBC 14.5* 01/11/2014   HGB 11.4* 01/11/2014   HCT 33.3* 01/11/2014   MCV 89.0 01/11/2014   PLT 306 01/11/2014     Assessment/Plan: 1 Day Post-Op   Problem List Items Addressed This Visit   None    Visit Diagnoses   Dislocation of hip, right, closed    -  Primary       WBAT in hip brace Bed rest until brace is adjusted Discussed treatment options with son over phone: brace adjustment vs. Conversion to constrained THA vs. girdlestone Son wishes to be conservative with treatment at this time Anticipate dc back to [facility] today   Cheral AlmasXu, Disaya Walt Michael 02/24/2014, 7:44 AM [phone]

## 2014-02-24 NOTE — Progress Notes (Addendum)
Pt from [facility] [place] admitted after midnight, ( OUT PT in Bed ) with a dislocated hip. Closed treatment of right hip hemiarthroplasty dislocation with manipulation preformed. MD is ready for pt to return to [facility]. SNF contacted an will readmit today. MD spoke to son this am and provided updated. CSW will arrange P-TAR transport to [facility]. Pt is in agreement with this plan.  Cori RazorJamie Tashia Leiterman LCSW [phone]   [phone] CSW informed by nsg that d/c is on hold until brace has been delivered. This may not be until tomorrow.SNF has been updated.  Cori RazorJamie Alvia Jablonski LCSW [phone]

## 2014-02-24 NOTE — Progress Notes (Signed)
   CARE MANAGEMENT NOTE 02/24/2014  Patient:  Morgan Morris,Morgan Morris   Account Number:  [id number]  Date Initiated:  02/24/2014  Documentation initiated by:  [facility],Shellie Rogoff  Subjective/Objective Assessment:   CLOSED MANIPULATION HIP (Right)     Action/Plan:   SNF   Anticipated DC Date:  02/26/2014   Anticipated DC Plan:  SKILLED NURSING FACILITY  In-house referral  Clinical Social Worker      DC Planning Services  CM consult      Choice offered to / List presented to:             Status of service:  Completed, signed off Medicare Important Message given?   (If response is "NO", the following Medicare IM given date fields will be blank) Date Medicare IM given:   Date Additional Medicare IM given:    Discharge Disposition:  SKILLED NURSING FACILITY  Per UR Regulation:    If discussed at Long Length of Stay Meetings, dates discussed:    Comments:  02/24/2014 1515 Plan is return back to SNF. Isidoro DonningAlesia Coltin Casher RN CCM Case Mgmt phone [phone]

## 2014-02-24 NOTE — Plan of Care (Signed)
Problem: Phase I Progression Outcomes Goal: Voiding-avoid urinary catheter unless indicated Outcome: Completed/Met Date Met:  02/24/14 incontinent

## 2014-02-24 NOTE — Progress Notes (Signed)
Patient will be fitted with new brace tomorrow. Up with PT afterwards and dc to [facility] after PT eval. Dr Lajoyce Cornersuda to see patient tomorrow  N. Glee ArvinMichael Xu, MD [facility] [phone] 4:44 PM

## 2014-02-24 NOTE — Transfer of Care (Signed)
Immediate Anesthesia Transfer of Care Note  Patient: Morgan LeaderBetty G Morris  Procedure(s) Performed: Procedure(s) with comments: CLOSED MANIPULATION HIP (Right) - CLOSED MANIPULATION RIGHT HIP  Patient Location: PACU  Anesthesia Type:General  Level of Consciousness: sedated and patient cooperative  Airway & Oxygen Therapy: Patient Spontanous Breathing and Patient connected to face mask oxygen  Post-op Assessment: Report given to PACU RN and Post -op Vital signs reviewed and stable  Post vital signs: Reviewed and stable  Complications: No apparent anesthesia complications

## 2014-02-25 DIAGNOSIS — T84029A Dislocation of unspecified internal joint prosthesis, initial encounter: Secondary | ICD-10-CM | POA: Diagnosis not present

## 2014-02-25 LAB — GLUCOSE, CAPILLARY
Glucose-Capillary: 182 mg/dL — ABNORMAL HIGH (ref 70–99)
Glucose-Capillary: 171 mg/dL — ABNORMAL HIGH (ref 70–99)
Glucose-Capillary: 99 mg/dL (ref 70–99)

## 2014-02-25 NOTE — Evaluation (Signed)
Physical Therapy Evaluation Patient Details Name: Morgan Morris MRN: [id number] DOB: [birth year] Today's Date: 02/25/2014 Time: 7782-4235 PT Time Calculation (min): 9 min  PT Assessment / Plan / Recommendation History of Present Illness  80yo female s/p closed reduction right hip heniarthroplasty (multiple dislocations)  Clinical Impression  Pt is limited on eval by cognition; Will need to return to SNF for PT at that level; No further needs at this time this venue    PT Assessment  Patient needs continued PT services;All further PT needs can be met in the next venue of care    Follow Up Recommendations  SNF    Does the patient have the potential to tolerate intense rehabilitation      Barriers to Discharge        Equipment Recommendations       Recommendations for Other Services     Frequency      Precautions / Restrictions Precautions Precautions: Fall;Posterior Hip Required Braces or Orthoses: Other Brace/Splint Other Brace/Splint: right long leg hip abduction brace Restrictions Other Position/Activity Restrictions: WBAT   Pertinent Vitals/Pain No c/o pain      Mobility  Bed Mobility Overal bed mobility: Needs Assistance Bed Mobility: Supine to Sit;Sit to Supine Supine to sit: Max assist Sit to supine: Max assist General bed mobility comments: +2 total for scooting up in bed in supine; max assist and multi-modal cues to sit EOB; +2 for mobility would be helpful due to pt confusion, hip precautions Transfers General transfer comment: not tested a this time; pt attempting to immediately get back into bed once sitting    Exercises     PT Diagnosis:    PT Problem List:   PT Treatment Interventions:       PT Goals(Current goals can be found in the care plan section) Acute Rehab PT Goals Patient Stated Goal: unable to state PT Goal Formulation: No goals set, d/c therapy Potential to Achieve Goals: Fair  Visit Information  Last PT Received On:  02/25/14 Assistance Needed: +2 History of Present Illness: 80yo female s/p closed reduction right hip heniarthroplasty (multiple dislocations)       Prior Functioning  Home Living Family/patient expects to be discharged to:: Skilled nursing facility Additional Comments: pt from [facility] Prior Function Level of Independence: Needs assistance Communication Communication: [facility]    Cognition  Cognition Arousal/Alertness: Awake/alert Behavior During Therapy: Restless Overall Cognitive Status: History of cognitive impairments - at baseline Area of Impairment: Orientation;Attention;Memory;Following commands;Safety/judgement;Awareness Orientation Level: Disoriented to;Place;Time;Situation Current Attention Level: Sustained Memory: Decreased recall of precautions;Decreased short-term memory Following Commands: Follows one step commands inconsistently Safety/Judgement: Decreased awareness of safety;Decreased awareness of deficits General Comments: limited hx this adm, chart does state hx of dementia; pt appears to be having intermittent visual hallucinations as well    Extremity/Trunk Assessment Upper Extremity Assessment Upper Extremity Assessment: Generalized weakness Lower Extremity Assessment Lower Extremity Assessment: Generalized weakness;RLE deficits/detail RLE: Unable to fully assess due to immobilization   Balance Balance Overall balance assessment: Needs assistance Sitting-balance support: Feet unsupported;No upper extremity supported Sitting balance-Leahy Scale: Poor Sitting balance - Comments: pt does not attempt to support self  End of Session PT - End of Session Activity Tolerance: Other (comment) (pt limited by cognition) Patient left: in bed;with bed alarm set Nurse Communication: Mobility status  GP     [facility] 02/25/2014, 2:35 PM

## 2014-02-25 NOTE — Progress Notes (Signed)
Nutrition Brief Note  Patient identified on the Malnutrition Screening Tool (MST) Report  Wt Readings from Last 15 Encounters:  02/24/14 135 lb 5.8 oz (61.4 kg)  02/24/14 135 lb 5.8 oz (61.4 kg)  12/30/13 135 lb 4.8 oz (61.372 kg)  12/30/13 135 lb 4.8 oz (61.372 kg)  11/17/13 150 lb 9.2 oz (68.3 kg)  11/17/13 150 lb 9.2 oz (68.3 kg)  08/17/13 131 lb 3.2 oz (59.512 kg)  06/03/13 145 lb 1 oz (65.8 kg)  04/01/13 138 lb 7.2 oz (62.8 kg)  02/27/13 152 lb (68.947 kg)  03/29/12 146 lb 14.4 oz (66.633 kg)  03/29/12 146 lb 14.4 oz (66.633 kg)  03/29/12 146 lb 14.4 oz (66.633 kg)    Body mass index is 26.44 kg/(m^2). Patient meets criteria for Overweight based on current BMI.   Current diet order is Low Sodium, patient is consuming approximately 75-100% of meals at this time. Labs and medications reviewed.   Per discussion with RN, pt is eating >75% of meals. Does require large amount of encouragement and feeding assistance from staff d/t dementia and lethargy. Had episode of low blood glucose prior to dinner yesterday, but improved post meal. Plan to be d/c to SNF today (3/06)  No nutrition interventions warranted at this time. If nutrition issues arise, please consult RD.   Lloyd HugerSarah F Khamryn Calderone MS RD LDN Clinical Dietitian Pager:[phone]

## 2014-02-25 NOTE — Progress Notes (Signed)
UR completed. Sharaine Delange RN CCM Case Mgmt phone [phone] 

## 2014-02-26 NOTE — Discharge Summary (Signed)
Physician Discharge Summary      Patient ID: Morgan Morris MRN: [id number] DOB/AGE: [id number] 78 y.o.  Admit date: 02/23/2014 Discharge date: 02/26/2014  Admission Diagnoses:  Recurrent prosthetic hip dislocation  Discharge Diagnoses:  Active Problems:   Hip dislocation, right   Past Medical History  Diagnosis Date  . Hypertension   . Depression   . Erosive esophagitis 04/02/2012  . Mallory - Weiss tear 04/02/2012  . Dysrhythmia 02/24/2013    ATRIAL FIB WITH RVR  . Arthritis   . Atrial fibrillation   . CHF (congestive heart failure)   . Diabetes mellitus     Surgeries: Procedure(s): CLOSED MANIPULATION HIP on 02/23/2014 - 02/24/2014   Consultants (if any):    Discharged Condition: Improved  Hospital Course: Morgan Morris is an 80 y.o. female who was admitted 02/23/2014 with a diagnosis of right prosthetic hip dislocation and went to the operating room on 02/23/2014 - 02/24/2014 and underwent the above named procedures.    She was given perioperative antibiotics:      Anti-infectives   None    .  She was given sequential compression devices, early ambulation for DVT prophylaxis.  She benefited maximally from the hospital stay and there were no complications.  She was fitted for a new brace and boot to reduce her risk of future dislocations.  Discussion with son, Morgan Morris, about treatment options and we agreed to proceed conservatively for now.    Recent vital signs:  Filed Vitals:   02/25/14 1408  BP: 98/51  Pulse: 98  Temp: 98.3 F (36.8 C)  Resp: 18    Recent laboratory studies:  Lab Results  Component Value Date   HGB 11.4* 01/11/2014   HGB 10.2* 12/29/2013   HGB 7.7* 11/16/2013   Lab Results  Component Value Date   WBC 14.5* 01/11/2014   PLT 306 01/11/2014   Lab Results  Component Value Date   INR 1.66* 12/29/2013   Lab Results  Component Value Date   NA 134* 01/11/2014   K 4.9 01/11/2014   CL 93* 01/11/2014   CO2 24 01/11/2014   BUN 41* 01/11/2014   CREATININE 1.96* 01/11/2014   GLUCOSE 134* 01/11/2014    Discharge Medications:     Medication List         albuterol 108 (90 BASE) MCG/ACT inhaler  Commonly known as:  PROVENTIL HFA;VENTOLIN HFA  Inhale 2 puffs into the lungs every 6 (six) hours as needed for wheezing.     albuterol (2.5 MG/3ML) 0.083% nebulizer solution  Commonly known as:  PROVENTIL  Take 2.5 mg by nebulization every 6 (six) hours as needed for wheezing.     apixaban 2.5 MG Tabs tablet  Commonly known as:  ELIQUIS  Take 1 tablet (2.5 mg total) by mouth 2 (two) times daily.     b complex vitamins tablet  Take 1 tablet by mouth daily at 12 noon.     cholecalciferol 1000 UNITS tablet  Commonly known as:  VITAMIN D  Take 1,000 Units by mouth daily.     diltiazem 180 MG 24 hr capsule  Commonly known as:  CARDIZEM CD  Take 1 capsule (180 mg total) by mouth daily.     furosemide 20 MG tablet  Commonly known as:  LASIX  Take 40 mg by mouth daily.     HYDROcodone-acetaminophen 5-325 MG per tablet  Commonly known as:  NORCO/VICODIN  Take 1 tablet by mouth every 6 (six) hours as needed for moderate pain.  insulin aspart 100 UNIT/ML injection  Commonly known as:  novoLOG  Inject 3 Units into the skin 2 (two) times daily. With lunch and dinner. According to sliding scale     insulin aspart 100 UNIT/ML injection  Commonly known as:  novoLOG  Inject 6 Units into the skin 2 (two) times daily. With lunch and supper     insulin glargine 100 UNIT/ML injection  Commonly known as:  LANTUS  Inject 15 Units into the skin at bedtime.     latanoprost 0.005 % ophthalmic solution  Commonly known as:  XALATAN  Place 1 drop into both eyes at bedtime.     memantine 10 MG tablet  Commonly known as:  NAMENDA  Take 10 mg by mouth 2 (two) times daily.     metoprolol tartrate 25 MG tablet  Commonly known as:  LOPRESSOR  Take 1 tablet (25 mg total) by mouth 2 (two) times daily.     NUTRITIONAL SUPPLEMENT PO  Take 90  mLs by mouth 2 (two) times daily. Sugar free med pass nutritional supplement     OCUVITE PRESERVISION PO  Take 1 tablet by mouth 2 (two) times daily.     potassium chloride SA 20 MEQ tablet  Commonly known as:  K-DUR,KLOR-CON  Take 2 tablets (40 mEq total) by mouth daily.     pravastatin 40 MG tablet  Commonly known as:  PRAVACHOL  Take 40 mg by mouth daily.     QUEtiapine 25 MG tablet  Commonly known as:  SEROQUEL  Take 6.25 mg by mouth at bedtime.     sertraline 50 MG tablet  Commonly known as:  ZOLOFT  Take 75 mg by mouth daily.     spironolactone 25 MG tablet  Commonly known as:  ALDACTONE  Take 12.5 mg by mouth every morning.        Diagnostic Studies: Dg Hip Complete Right  02/24/2014   CLINICAL DATA:  Postreduction  EXAM: RIGHT HIP - COMPLETE 2+ VIEW  COMPARISON:  Radiography from the same day at 10:15 p.m.  FINDINGS: In the frontal projection, relocated right hip hemiarthroplasty. No evidence of periprosthetic fracture. Heterotopic ossification above the prosthesis.  IMPRESSION: Relocated right prosthetic hip.   Electronically Signed   By: Tiburcio Pea M.D.   On: 02/24/2014 00:47   Dg Hip Complete Right  02/23/2014   CLINICAL DATA:  Right hip pain  EXAM: RIGHT HIP - COMPLETE 2+ VIEW  COMPARISON:  DG HIP 1 VIEW*R* dated 01/11/2014; CT PELVIS W/O CM dated 01/11/2014; DG HIP COMPLETE*R* dated 01/11/2014; DG HIP COMPLETE*R* dated 12/29/2013  FINDINGS: There is a total right hip arthroplasty. There is superior dislocation of the right hip. The right acetabular cup component is superiorly dislocated with normal articulation between the femoral head component and the acetabular components. There is no acute fracture.  There are mild degenerative changes of bilateral SI joints and lower lumbar spine.  IMPRESSION: Superior dislocation of the right hip arthroplasty. The right acetabular cup component is superiorly dislocated with normal articulation between the femoral head component and  acetabular component.   Electronically Signed   By: Elige Ko   On: 02/23/2014 20:05   Dg Hip Portable 1 View Right  02/23/2014   CLINICAL DATA:  Postreduction  EXAM: PORTABLE RIGHT HIP - 1 VIEW  COMPARISON:  Radiography from today at 7:41 p.m.  FINDINGS: Persistent superior dislocation of a right hip hemiarthroplasty. No acute fracture.  IMPRESSION: Persistent right hip prosthesis dislocation.   Electronically  Signed   By: Tiburcio PeaJonathan  Watts M.D.   On: 02/23/2014 22:36    Disposition: 01-Home or Self Care  Discharge Orders   Future Orders Complete By Expires   Call MD / Call 911  As directed    Comments:     If you experience chest pain or shortness of breath, CALL 911 and be transported to the hospital emergency room.  If you develope a fever above 101 F, pus (white drainage) or increased drainage or redness at the wound, or calf pain, call your surgeon's office.   Constipation Prevention  As directed    Comments:     Drink plenty of fluids.  Prune juice may be helpful.  You may use a stool softener, such as Colace (over the counter) 100 mg twice a day.  Use MiraLax (over the counter) for constipation as needed.   Diet - low sodium heart healthy  As directed    Driving restrictions  As directed    Comments:     No driving while taking narcotic pain meds.   Increase activity slowly as tolerated  As directed       Follow-up Information   Follow up with Cheral AlmasXu, Naiping Michael, MD In 2 weeks.   Specialty:  Orthopedic Surgery   Contact information:   [address] [place] [id number] [phone]        Signed: Cheral AlmasXu, Naiping Michael 02/26/2014, 9:49 AM

## 2014-02-28 NOTE — H&P (Signed)
ORTHOPAEDIC HISTORY AND PHYSICAL  Chief Complaint:  Recurrent prosthetic hip dislocation  HPI: Morgan Morris is a 80 y.o. female who complains of recurrent prosthetic hip dislocation at SNF.  Details are unclear per SNF as patient is unable to provide history.  This is her 4th dislocation since the surgery.  She's currently being treated in a hip abduction brace but patient is witnessed to be noncompliant and takes off the brace herself.  Patient has dementia at baseline.  Past Medical History  Diagnosis Date  . Hypertension   . Depression   . Erosive esophagitis 04/02/2012  . Mallory - Weiss tear 04/02/2012  . Dysrhythmia 02/24/2013    ATRIAL FIB WITH RVR  . Arthritis   . Atrial fibrillation   . CHF (congestive heart failure)   . Diabetes mellitus    Past Surgical History  Procedure Laterality Date  . Abdominal hysterectomy    . Appendectomy    . Cholecystectomy    . Esophagogastroduodenoscopy  03/31/2012    Procedure: ESOPHAGOGASTRODUODENOSCOPY (EGD);  Surgeon: Meryl DareMalcolm T Stark, MD,FACG;  [facility];  Service: Endoscopy;  Laterality: N/A;  . Hip arthroplasty Right 11/12/2013    Procedure: RIGHT HIP HEMIARTHROPLASTY;  Surgeon: Cheral AlmasNaiping Michael Xu, MD;  Location: [facility];  Service: Orthopedics;  Laterality: Right;  . Hip closed reduction Right 12/29/2013    Procedure: CLOSED REDUCTION HIP;  Surgeon: Cheral AlmasNaiping Michael Xu, MD;  Location: [facility];  Service: Orthopedics;  Laterality: Right;  . Hip closed reduction Right 02/23/2014    Procedure: CLOSED MANIPULATION HIP;  Surgeon: Cheral AlmasNaiping Michael Xu, MD;  Location: [facility];  Service: Orthopedics;  Laterality: Right;  CLOSED MANIPULATION RIGHT HIP   History   Social History  . Marital Status: Single    Spouse Name: N/A    Number of Children: N/A  . Years of Education: N/A   Social History Main Topics  . Smoking status: Never Smoker   . Smokeless tobacco: Never Used  . Alcohol Use: No  . Drug Use: No  . Sexual Activity: No    Other Topics Concern  . None   Social History Narrative  . None   Family History  Problem Relation Age of Onset  . Cancer Mother    Allergies  Allergen Reactions  . Penicillins     unknown  . Sulfa Antibiotics     unknown   Prior to Admission medications   Medication Sig Start Date End Date Taking? Authorizing Provider  apixaban (ELIQUIS) 2.5 MG TABS tablet Take 1 tablet (2.5 mg total) by mouth 2 (two) times daily. 11/17/13  Yes Kathlen ModyVijaya Akula, MD  b complex vitamins tablet Take 1 tablet by mouth daily at 12 noon.   Yes Historical Provider, MD  cholecalciferol (VITAMIN D) 1000 UNITS tablet Take 1,000 Units by mouth daily.   Yes Historical Provider, MD  diltiazem (CARDIZEM CD) 180 MG 24 hr capsule Take 1 capsule (180 mg total) by mouth daily. 06/02/13  Yes Elease EtienneAnand D Hongalgi, MD  furosemide (LASIX) 20 MG tablet Take 40 mg by mouth daily.   Yes Historical Provider, MD  insulin aspart (NOVOLOG) 100 UNIT/ML injection Inject 6 Units into the skin 2 (two) times daily. With lunch and supper   Yes Historical Provider, MD  insulin glargine (LANTUS) 100 UNIT/ML injection Inject 15 Units into the skin at bedtime.   Yes Historical Provider, MD  latanoprost (XALATAN) 0.005 % ophthalmic solution Place 1 drop into both eyes at bedtime.   Yes Historical Provider,  MD  memantine (NAMENDA) 10 MG tablet Take 10 mg by mouth 2 (two) times daily.   Yes Historical Provider, MD  metoprolol tartrate (LOPRESSOR) 25 MG tablet Take 1 tablet (25 mg total) by mouth 2 (two) times daily. 08/17/13  Yes Clydia Llano, MD  Multiple Vitamins-Minerals (OCUVITE PRESERVISION PO) Take 1 tablet by mouth 2 (two) times daily.   Yes Historical Provider, MD  Nutritional Supplements (NUTRITIONAL SUPPLEMENT PO) Take 90 mLs by mouth 2 (two) times daily. Sugar free med pass nutritional supplement   Yes Historical Provider, MD  potassium chloride SA (K-DUR,KLOR-CON) 20 MEQ tablet Take 2 tablets (40 mEq total) by mouth daily. 04/01/13  Yes  Zannie Cove, MD  pravastatin (PRAVACHOL) 40 MG tablet Take 40 mg by mouth daily.   Yes Historical Provider, MD  QUEtiapine (SEROQUEL) 25 MG tablet Take 6.25 mg by mouth at bedtime.   Yes Historical Provider, MD  sertraline (ZOLOFT) 50 MG tablet Take 75 mg by mouth daily.   Yes Historical Provider, MD  spironolactone (ALDACTONE) 25 MG tablet Take 12.5 mg by mouth every morning.   Yes Historical Provider, MD  albuterol (PROVENTIL HFA;VENTOLIN HFA) 108 (90 BASE) MCG/ACT inhaler Inhale 2 puffs into the lungs every 6 (six) hours as needed for wheezing. 02/27/13   Richarda Overlie, MD  albuterol (PROVENTIL) (2.5 MG/3ML) 0.083% nebulizer solution Take 2.5 mg by nebulization every 6 (six) hours as needed for wheezing.    Historical Provider, MD  HYDROcodone-acetaminophen (NORCO/VICODIN) 5-325 MG per tablet Take 1 tablet by mouth every 6 (six) hours as needed for moderate pain.    Historical Provider, MD  insulin aspart (NOVOLOG) 100 UNIT/ML injection Inject 3 Units into the skin 2 (two) times daily. With lunch and dinner. According to sliding scale    Historical Provider, MD   No results found.  Positive ROS: All other systems have been reviewed and were otherwise negative with the exception of those mentioned in the HPI and as above.  Physical Exam: General: Demented, no acute distress, pleasant Cardiovascular: No pedal edema Respiratory: No cyanosis, no use of accessory musculature GI: No organomegaly, abdomen is soft and non-tender Skin: No lesions in the area of chief complaint Neurologic: Sensation intact distally Psychiatric: Patient is pleasantly demented Lymphatic: No axillary or cervical lymphadenopathy  MUSCULOSKELETAL:  RLE is slightly shorter and internally rotated NVI Scar well healed  Assessment: Recurrent prosthetic hip dislocation, right  Plan: - failed CR by ED - will attempt CR in OR - will admit overnight for fitting of new brace - will discuss with son, Gerlene Burdock, about  treatment options going forward  N. Glee Arvin, MD [facility] [phone] 5:36 PM

## 2014-08-23 DEATH — deceased
